# Patient Record
Sex: Female | Born: 1950 | Race: White | Hispanic: No | Marital: Married | Smoking: Former smoker
Health system: Southern US, Community
[De-identification: ages and names within clinical notes are randomized; demographics above are authoritative.]

## PROBLEM LIST (undated history)

## (undated) ENCOUNTER — Ambulatory Visit (HOSPITAL_COMMUNITY): Admission: EM | Discharge status: Absent without leave | Payer: PPO

## (undated) DIAGNOSIS — G4700 Insomnia, unspecified: Secondary | ICD-10-CM

## (undated) DIAGNOSIS — E785 Hyperlipidemia, unspecified: Secondary | ICD-10-CM

## (undated) DIAGNOSIS — Z8619 Personal history of other infectious and parasitic diseases: Secondary | ICD-10-CM

## (undated) DIAGNOSIS — R002 Palpitations: Secondary | ICD-10-CM

## (undated) DIAGNOSIS — I10 Essential (primary) hypertension: Secondary | ICD-10-CM

## (undated) DIAGNOSIS — T7840XA Allergy, unspecified, initial encounter: Secondary | ICD-10-CM

## (undated) DIAGNOSIS — M1990 Unspecified osteoarthritis, unspecified site: Secondary | ICD-10-CM

## (undated) DIAGNOSIS — K635 Polyp of colon: Secondary | ICD-10-CM

## (undated) DIAGNOSIS — H269 Unspecified cataract: Secondary | ICD-10-CM

## (undated) DIAGNOSIS — R0982 Postnasal drip: Secondary | ICD-10-CM

## (undated) HISTORY — PX: JOINT REPLACEMENT: SHX530

## (undated) HISTORY — PX: ESOPHAGOGASTRODUODENOSCOPY: SHX1529

## (undated) HISTORY — DX: Hyperlipidemia, unspecified: E78.5

## (undated) HISTORY — DX: Essential (primary) hypertension: I10

## (undated) HISTORY — DX: Unspecified osteoarthritis, unspecified site: M19.90

## (undated) HISTORY — DX: Unspecified cataract: H26.9

## (undated) HISTORY — DX: Postnasal drip: R09.82

## (undated) HISTORY — PX: KNEE SURGERY: SHX244

## (undated) HISTORY — PX: COLONOSCOPY: SHX174

## (undated) HISTORY — PX: ABDOMINAL HYSTERECTOMY: SHX81

## (undated) HISTORY — DX: Personal history of other infectious and parasitic diseases: Z86.19

## (undated) HISTORY — PX: EYE SURGERY: SHX253

## (undated) HISTORY — PX: OTHER SURGICAL HISTORY: SHX169

## (undated) HISTORY — DX: Insomnia, unspecified: G47.00

## (undated) HISTORY — DX: Allergy, unspecified, initial encounter: T78.40XA

## (undated) HISTORY — DX: Palpitations: R00.2

## (undated) HISTORY — PX: BREAST EXCISIONAL BIOPSY: SUR124

## (undated) HISTORY — DX: Polyp of colon: K63.5

---

## 2001-01-25 HISTORY — PX: KNEE SURGERY: SHX244

## 2001-02-06 ENCOUNTER — Ambulatory Visit (HOSPITAL_BASED_OUTPATIENT_CLINIC_OR_DEPARTMENT_OTHER): Admission: RE | Admit: 2001-02-06 | Discharge: 2001-02-07 | Payer: Self-pay | Admitting: Orthopaedic Surgery

## 2004-05-27 ENCOUNTER — Encounter: Payer: Self-pay | Admitting: Family Medicine

## 2004-08-01 ENCOUNTER — Ambulatory Visit: Payer: Self-pay | Admitting: Family Medicine

## 2004-10-20 ENCOUNTER — Ambulatory Visit: Payer: Self-pay | Admitting: Family Medicine

## 2004-11-25 HISTORY — PX: CARPAL TUNNEL RELEASE: SHX101

## 2004-12-14 ENCOUNTER — Ambulatory Visit: Payer: Self-pay | Admitting: Family Medicine

## 2004-12-26 ENCOUNTER — Encounter: Admission: RE | Admit: 2004-12-26 | Discharge: 2004-12-26 | Payer: Self-pay | Admitting: Family Medicine

## 2005-04-12 ENCOUNTER — Encounter: Admission: RE | Admit: 2005-04-12 | Discharge: 2005-04-12 | Payer: Self-pay | Admitting: Obstetrics and Gynecology

## 2005-06-15 ENCOUNTER — Ambulatory Visit: Payer: Self-pay | Admitting: Family Medicine

## 2005-07-17 ENCOUNTER — Ambulatory Visit: Payer: Self-pay | Admitting: Internal Medicine

## 2005-10-25 ENCOUNTER — Ambulatory Visit: Payer: Self-pay | Admitting: Family Medicine

## 2005-10-26 ENCOUNTER — Encounter: Admission: RE | Admit: 2005-10-26 | Discharge: 2005-10-26 | Payer: Self-pay | Admitting: Family Medicine

## 2005-11-14 ENCOUNTER — Ambulatory Visit: Payer: Self-pay | Admitting: Gastroenterology

## 2005-12-05 ENCOUNTER — Encounter (INDEPENDENT_AMBULATORY_CARE_PROVIDER_SITE_OTHER): Payer: Self-pay | Admitting: *Deleted

## 2005-12-05 ENCOUNTER — Ambulatory Visit: Payer: Self-pay | Admitting: Gastroenterology

## 2005-12-25 HISTORY — PX: CHOLECYSTECTOMY: SHX55

## 2006-01-17 ENCOUNTER — Encounter (INDEPENDENT_AMBULATORY_CARE_PROVIDER_SITE_OTHER): Payer: Self-pay | Admitting: Specialist

## 2006-01-17 ENCOUNTER — Ambulatory Visit (HOSPITAL_COMMUNITY): Admission: RE | Admit: 2006-01-17 | Discharge: 2006-01-17 | Payer: Self-pay | Admitting: Surgery

## 2006-02-15 ENCOUNTER — Encounter: Admission: RE | Admit: 2006-02-15 | Discharge: 2006-02-15 | Payer: Self-pay | Admitting: Surgery

## 2006-04-15 ENCOUNTER — Encounter: Admission: RE | Admit: 2006-04-15 | Discharge: 2006-04-15 | Payer: Self-pay | Admitting: Obstetrics and Gynecology

## 2006-07-12 ENCOUNTER — Ambulatory Visit: Payer: Self-pay | Admitting: Family Medicine

## 2006-07-25 ENCOUNTER — Encounter: Admission: RE | Admit: 2006-07-25 | Discharge: 2006-07-25 | Payer: Self-pay | Admitting: Family Medicine

## 2006-10-22 ENCOUNTER — Ambulatory Visit: Payer: Self-pay | Admitting: Family Medicine

## 2007-01-28 ENCOUNTER — Emergency Department (HOSPITAL_COMMUNITY): Admission: EM | Admit: 2007-01-28 | Discharge: 2007-01-28 | Payer: Self-pay | Admitting: Emergency Medicine

## 2007-05-07 ENCOUNTER — Encounter: Admission: RE | Admit: 2007-05-07 | Discharge: 2007-05-07 | Payer: Self-pay | Admitting: Obstetrics and Gynecology

## 2007-06-11 ENCOUNTER — Encounter: Payer: Self-pay | Admitting: Family Medicine

## 2007-06-11 DIAGNOSIS — E785 Hyperlipidemia, unspecified: Secondary | ICD-10-CM

## 2007-06-11 DIAGNOSIS — R002 Palpitations: Secondary | ICD-10-CM | POA: Insufficient documentation

## 2007-06-17 ENCOUNTER — Encounter: Payer: Self-pay | Admitting: Family Medicine

## 2007-06-18 ENCOUNTER — Ambulatory Visit: Payer: Self-pay | Admitting: Family Medicine

## 2007-06-18 DIAGNOSIS — G4700 Insomnia, unspecified: Secondary | ICD-10-CM

## 2007-06-19 LAB — CONVERTED CEMR LAB
ALT: 19 units/L (ref 0–35)
Cholesterol: 232 mg/dL (ref 0–200)
Direct LDL: 159.1 mg/dL
HDL: 54.3 mg/dL (ref >39.0)
Triglycerides: 129 mg/dL (ref 0–149)
VLDL: 26 mg/dL (ref 0–40)

## 2007-06-20 ENCOUNTER — Ambulatory Visit: Payer: Self-pay | Admitting: Cardiology

## 2007-09-05 ENCOUNTER — Encounter: Payer: Self-pay | Admitting: Family Medicine

## 2008-03-03 ENCOUNTER — Ambulatory Visit: Payer: Self-pay | Admitting: Family Medicine

## 2008-05-07 ENCOUNTER — Encounter: Admission: RE | Admit: 2008-05-07 | Discharge: 2008-05-07 | Payer: Self-pay | Admitting: Obstetrics and Gynecology

## 2008-08-03 ENCOUNTER — Ambulatory Visit: Payer: Self-pay | Admitting: Family Medicine

## 2008-08-03 ENCOUNTER — Telehealth (INDEPENDENT_AMBULATORY_CARE_PROVIDER_SITE_OTHER): Payer: Self-pay | Admitting: *Deleted

## 2008-08-03 ENCOUNTER — Telehealth: Payer: Self-pay | Admitting: Family Medicine

## 2008-08-03 LAB — CONVERTED CEMR LAB
Bilirubin Urine: NEGATIVE
Casts: 0 /lpf
Glucose, Urine, Semiquant: NEGATIVE
Ketones, urine, test strip: NEGATIVE
Specific Gravity, Urine: 1.01
Urobilinogen, UA: 0.2
Yeast, UA: 0

## 2008-08-04 ENCOUNTER — Encounter: Payer: Self-pay | Admitting: Family Medicine

## 2008-08-17 ENCOUNTER — Ambulatory Visit: Payer: Self-pay | Admitting: Family Medicine

## 2008-08-17 LAB — CONVERTED CEMR LAB
Bilirubin Urine: NEGATIVE
Glucose, Urine, Semiquant: NEGATIVE
Ketones, urine, test strip: NEGATIVE
Nitrite: NEGATIVE
Specific Gravity, Urine: 1.025
pH: 6

## 2008-08-18 ENCOUNTER — Encounter: Payer: Self-pay | Admitting: Family Medicine

## 2008-08-18 LAB — CONVERTED CEMR LAB
ALT: 20 units/L (ref 0–35)
Cholesterol: 216 mg/dL — ABNORMAL HIGH (ref 0–200)
LDL Cholesterol: 128 mg/dL — ABNORMAL HIGH (ref 0–99)
Triglycerides: 161 mg/dL — ABNORMAL HIGH (ref <150)

## 2009-05-09 ENCOUNTER — Encounter: Admission: RE | Admit: 2009-05-09 | Discharge: 2009-05-09 | Payer: Self-pay | Admitting: Obstetrics and Gynecology

## 2009-05-11 ENCOUNTER — Encounter (INDEPENDENT_AMBULATORY_CARE_PROVIDER_SITE_OTHER): Payer: Self-pay | Admitting: *Deleted

## 2009-07-19 ENCOUNTER — Ambulatory Visit: Payer: Self-pay | Admitting: Family Medicine

## 2009-07-19 DIAGNOSIS — L821 Other seborrheic keratosis: Secondary | ICD-10-CM | POA: Insufficient documentation

## 2009-11-07 ENCOUNTER — Ambulatory Visit: Payer: Self-pay | Admitting: Family Medicine

## 2009-11-07 DIAGNOSIS — Z862 Personal history of diseases of the blood and blood-forming organs and certain disorders involving the immune mechanism: Secondary | ICD-10-CM

## 2009-11-08 LAB — CONVERTED CEMR LAB
Basophils Absolute: 0 10*3/uL (ref 0.0–0.1)
Basophils Relative: 0.4 % (ref 0.0–3.0)
Eosinophils Absolute: 0.1 10*3/uL (ref 0.0–0.7)
HCT: 40.8 % (ref 36.0–46.0)
Hemoglobin: 13.8 g/dL (ref 12.0–15.0)
Iron: 104 ug/dL (ref 42–145)
Lymphocytes Relative: 26.1 % (ref 12.0–46.0)
MCV: 96.7 fL (ref 78.0–100.0)
Monocytes Absolute: 0.5 10*3/uL (ref 0.1–1.0)
Platelets: 141 10*3/uL — ABNORMAL LOW (ref 150.0–400.0)
Transferrin: 289.7 mg/dL (ref 212.0–360.0)

## 2009-12-19 ENCOUNTER — Ambulatory Visit: Payer: Self-pay | Admitting: Family Medicine

## 2009-12-19 ENCOUNTER — Telehealth: Payer: Self-pay | Admitting: Family Medicine

## 2009-12-19 LAB — CONVERTED CEMR LAB
Casts: 0 /lpf
Glucose, Urine, Semiquant: NEGATIVE
Ketones, urine, test strip: NEGATIVE
Protein, U semiquant: NEGATIVE
Specific Gravity, Urine: 1.01
Urine crystals, microscopic: 0 /hpf
Urobilinogen, UA: 0.2
Yeast, UA: 0
pH: 5

## 2009-12-20 ENCOUNTER — Encounter: Payer: Self-pay | Admitting: Family Medicine

## 2010-01-02 ENCOUNTER — Ambulatory Visit: Payer: Self-pay | Admitting: Family Medicine

## 2010-01-03 LAB — CONVERTED CEMR LAB
ALT: 24 units/L (ref 0–35)
AST: 25 units/L (ref 0–37)
Albumin: 4.1 g/dL (ref 3.5–5.2)
Alkaline Phosphatase: 106 units/L (ref 39–117)
BUN: 13 mg/dL (ref 6–23)
Basophils Absolute: 0 10*3/uL (ref 0.0–0.1)
Bilirubin, Direct: 0.1 mg/dL (ref 0.0–0.3)
CO2: 32 meq/L (ref 19–32)
Calcium: 10.4 mg/dL (ref 8.4–10.5)
Creatinine, Ser: 0.8 mg/dL (ref 0.4–1.2)
Eosinophils Absolute: 0.1 10*3/uL (ref 0.0–0.7)
GFR calc non Af Amer: 79.52 mL/min (ref >60)
Glucose, Bld: 110 mg/dL — ABNORMAL HIGH (ref 70–99)
Lymphocytes Relative: 46.4 % — ABNORMAL HIGH (ref 12.0–46.0)
Lymphs Abs: 2 10*3/uL (ref 0.7–4.0)
MCV: 94.3 fL (ref 78.0–100.0)
Monocytes Absolute: 0.5 10*3/uL (ref 0.1–1.0)
Monocytes Relative: 10.4 % (ref 3.0–12.0)
Sodium: 144 meq/L (ref 135–145)
Total Bilirubin: 0.5 mg/dL (ref 0.3–1.2)
Total CHOL/HDL Ratio: 5
Triglycerides: 263 mg/dL — ABNORMAL HIGH (ref 0.0–149.0)
WBC: 4.4 10*3/uL — ABNORMAL LOW (ref 4.5–10.5)

## 2010-01-06 ENCOUNTER — Ambulatory Visit: Payer: Self-pay | Admitting: Family Medicine

## 2010-01-06 DIAGNOSIS — R0982 Postnasal drip: Secondary | ICD-10-CM | POA: Insufficient documentation

## 2010-01-06 DIAGNOSIS — D126 Benign neoplasm of colon, unspecified: Secondary | ICD-10-CM | POA: Insufficient documentation

## 2010-05-10 ENCOUNTER — Encounter: Admission: RE | Admit: 2010-05-10 | Discharge: 2010-05-10 | Payer: Self-pay | Admitting: Obstetrics and Gynecology

## 2010-05-11 ENCOUNTER — Encounter: Payer: Self-pay | Admitting: Family Medicine

## 2010-09-26 NOTE — Assessment & Plan Note (Signed)
Summary: FEEL LIKE SHE HAS TO URINATE, BUT ONLY A FEW DROPS WHEN SHE G...  Nurse Visit Pt complains of feeling of urgency but when urinates only voids small amount. No frequency, no burn or pain when urinate and no back pain. symptoms started this morning. [facility] [place] if [facility] is needed [phone] and pt can be reached on cell [phone]. Please advise.    Allergies: 1)  Penicillin 2)  Keflex 3)  Amoxicillin 4)  * Nasonex 5)  Lipitor 6)  * Crestor Laboratory Results   Urine Tests  Date/Time Received: December 19, 2009 12:08 PM  Date/Time Reported: December 19, 2009 12:08 PM   Routine Urinalysis   Color: yellow Appearance: Clear Glucose: negative   (Normal Range: Negative) Bilirubin: negative   (Normal Range: Negative) Ketone: negative   (Normal Range: Negative) Spec. Gravity: 1.010   (Normal Range: 1.003-1.035) Blood: slight trace   (Normal Range: Negative) pH: 5.0   (Normal Range: 5.0-8.0) Protein: negative   (Normal Range: Negative) Urobilinogen: 0.2   (Normal Range: 0-1) Nitrite: negative   (Normal Range: Negative) Leukocyte Esterace: negative   (Normal Range: Negative)  Urine Microscopic WBC/HPF: 3-5 RBC/HPF: 1-2 Bacteria/HPF: few Mucous/HPF: few Epithelial/HPF: 1-2 Crystals/HPF: 0 Casts/LPF: 0 Yeast/HPF: 0 Other: 0    Comments: urine shows evidence of mild / early uti  push water intake please call in septra DS 1 by mouth two times a day for 5 days #10 no ref  please culture urine as well update if symptoms worsen or do not improve     Orders Added: 1)  Est. Patient Level I [87564] 2)  UA Dipstick W/ Micro (manual) [81000] 3)  T-Culture, Urine [[id number]] 4)  Specimen Handling [99000]   [facility] as instructed. Left message for patient to call back. Urine  culture has been sent.Lewanda Rife LPN  December 19, 2009 2:49 PM   Patient notified as instructed by telephone. Lewanda Rife LPN  December 19, 2009 3:33 PM

## 2010-09-26 NOTE — Letter (Signed)
Summary: Results Follow up Letter  [facility]  [address] [place], [place] [address]   Phone: [phone]  Fax: [phone]    05/11/2010 MRN: [id number]    Alisha Long [address] [place], [place]  [address]    Dear [name],  The following are the results of your recent test(s):  Test         Result    Pap Smear:        Normal _____  Not Normal _____ Comments: ______________________________________________________ Cholesterol: LDL(Bad cholesterol):         Your goal is less than:         HDL (Good cholesterol):       Your goal is more than: Comments:  ______________________________________________________ Mammogram:        Normal _X____  Not Normal _____ Comments:Repeat in one year.   ___________________________________________________________________ Hemoccult:        Normal _____  Not normal _______ Comments:    _____________________________________________________________________ Other Tests:    We routinely do not discuss normal results over the telephone.  If you desire a copy of the results, or you have any questions about this information we can discuss them at your next office visit.   Sincerely,    Idamae Schuller Maryetta Shafer,MD  MT/ri

## 2010-09-26 NOTE — Assessment & Plan Note (Signed)
Summary: CPX/CLE   Vital Signs:  Patient profile:   60 year old female Height:      65 inches Weight:      167.50 pounds BMI:     27.97 Temp:     98.2 degrees F oral Pulse rate:   64 / minute Pulse rhythm:   regular BP sitting:   124 / 76  (left arm) Cuff size:   regular  Vitals Entered By: Lewanda Rife LPN (01/13/10 10:26 AM) CC: CPX LMP partial Hyst 1988   CC:  CPX LMP partial Hyst 1988.  History of Present Illness: here for health mt exam and to rev chronic med problems   is doing better overall after recent ? uti    wt is down 3 lb-- is working on loosing wt , - is hard to go the gym    chol upa bit with trig 263/ HDL 46 and LDL 139 up from 128 not a lot of sat fats  no red meat  more fish and chicken  does cook with real butter    hyst in past partial  no gyn symptoms at all  pap 05- no problems   colon -07 -- took out polyp off -- said she need in 3 years   is taking some vit D - ? what to take  had heel dexa 3y ago and was fine    mam 9/10 self exam- no lumps or changes   Td 04  shingles status--had shingles in march , thinking about shingles vaccine this summer   gluc 110 - does like her chocolate  no thirst or other signs of DM   her somewhat chronic cough is somewhat worse  has chronic post nasal drip - it never stops  no indigestion or reflux symptoms    Allergies: 1)  Penicillin 2)  Keflex 3)  Amoxicillin 4)  * Nasonex 5)  Lipitor 6)  * Crestor  Past History:  Past Surgical History: Last updated: 06/11/2007 Caesarean section x 3 Hysterectomy- bleeding- ovaries intact Knee surgery (10/2000) Knee surgery, replaced ACL (01/2001) Carpal tunnel release (11/2004) Abd [place]- gallstones (10/2005) EGD- normal (11/2005) Colonoscopy- polyps Cholecystectomy- 12/2005)  Family History: Last updated: 13-Jan-2010 Father: deceased- epilepsy Mother: well Siblings:  no DM in family no OP in family   Social History: Last updated:  02/03/2009 Marital Status: Married Children: 3 Occupation: [employer] Tobacco Use - Former.  Alcohol Use - no Regular Exercise - yes Drug Use - no  Risk Factors: Exercise: yes (02/03/2009)  Risk Factors: Smoking Status: quit (02/03/2009)  Past Medical History:  SYMPTOM, INSOMNIA NOS (I Hx of PALPITATIONS (ICD-785.1) HYPERLIPIDEMIA (ICD-272.4) colon polyps  post nasal drip with chronic cough  hx of shingles in past  Family History: Father: deceased- epilepsy Mother: well Siblings:  no DM in family no OP in family   Review of Systems General:  Denies fatigue, fever, loss of appetite, and malaise. Eyes:  Denies blurring and discharge. ENT:  Complains of postnasal drainage; denies nasal congestion, sinus pressure, and sore throat. CV:  Denies chest pain or discomfort, lightheadness, palpitations, and shortness of breath with exertion. Resp:  Denies cough, shortness of breath, and wheezing. GI:  Denies abdominal pain, bloody stools, change in bowel habits, indigestion, and loss of appetite. GU:  Denies abnormal vaginal bleeding, discharge, dysuria, hematuria, and urinary frequency. MS:  Denies joint redness, joint swelling, muscle aches, and cramps. Derm:  Denies lesion(s), poor wound healing, and rash. Neuro:  Denies  numbness and tingling. Psych:  Denies anxiety and depression. Endo:  Denies cold intolerance, excessive thirst, excessive urination, and heat intolerance. Heme:  Denies abnormal bruising and bleeding.  Physical Exam  General:  Well-developed,well-nourished,in no acute distress; alert,appropriate and cooperative throughout examination Head:  normocephalic, atraumatic, and no abnormalities observed.   Eyes:  vision grossly intact, pupils equal, pupils round, and pupils reactive to light.  no conjunctival pallor, injection or icterus  Ears:  R ear normal and L ear normal.   Nose:  nares are boggy but clear  Mouth:  pharynx pink and moist.   small amt  of clear post nasal drip  Neck:  supple with full rom and no masses or thyromegally, no JVD or carotid bruit  Chest Wall:  No deformities, masses, or tenderness noted. Breasts:  No mass, nodules, thickening, tenderness, bulging, retraction, inflamation, nipple discharge or skin changes noted.   Lungs:  Normal respiratory effort, chest expands symmetrically. Lungs are clear to auscultation, no crackles or wheezes. Heart:  Normal rate and regular rhythm. S1 and S2 normal without gallop, murmur, click, rub or other extra sounds. Abdomen:  Bowel sounds positive,abdomen soft and non-tender without masses, organomegaly or hernias noted. no renal bruits  Msk:  No deformity or scoliosis noted of thoracic or lumbar spine.  no acute joint changes  Pulses:  R and L carotid,radial,femoral,dorsalis pedis and posterior tibial pulses are full and equal bilaterally Extremities:  No clubbing, cyanosis, edema, or deformity noted with normal full range of motion of all joints.   Neurologic:  sensation intact to light touch, gait normal, and DTRs symmetrical and normal.   Skin:  Intact without suspicious lesions or rashes some solar lentigos  Cervical Nodes:  No lymphadenopathy noted Axillary Nodes:  No palpable lymphadenopathy Inguinal Nodes:  No significant adenopathy Psych:  normal affect, talkative and pleasant    Impression & Recommendations:  Problem # 1:  HEALTH MAINTENANCE EXAM (ICD-V70.0) Assessment Comment Only reviewed health habits including diet, exercise and skin cancer prevention reviewed health maintenance list and family history  rev labs with pt in detail today  consider zostavax this summer if insurance covers it for her age group  Problem # 2:  HYPERLIPIDEMIA (ICD-272.4) Assessment: Unchanged  disc mild inc in chol and foods to avoid  rev impact of chol on cardiovasc health also rev imp of exercise   Labs Reviewed: SGOT: 25 (01/02/2010)   SGPT: 24 (01/02/2010)   HDL:46.70  (01/02/2010), 56 (08/17/2008)  LDL:128 (08/17/2008), DEL (06/18/2007)  Chol:227 (01/02/2010), 216 (08/17/2008)  Trig:263.0 (01/02/2010), 161 (08/17/2008)  Problem # 3:  POSTNASAL DRIP (ICD-784.91) Assessment: New with cough  will try  otc antihistamine if not imp -- consider gerd tx? (no GI symptoms at all but does clear her throat )   Problem # 4:  COLONIC POLYPS (ICD-211.3) Assessment: Unchanged schedule f/u colonosc at check out today no symptoms  Orders: Gastroenterology Referral (GI)  Complete Medication List: 1)  Calcium 1200 1200-1000 Mg-unit Chew (Calcium carbonate-vit d-min) .Marland Kitchen.. 1 daily by mouth 2)  E-400-clear 400 Unit Caps (Vitamin e) .Marland Kitchen.. 1 daily by mouth 3)  Flax Seeds  .... Daily 4)  Fish Oil Tabs  .... Daily 5)  Vitamin D 5000 Units  .... Take by mouth daily 6)  Elocon 0.1 % Crea (Mometasone furoate) .... Apply to affected area once daily as needed 7)  Mucinex Dm Maximum Strength 60-1200 Mg Xr12h-tab (Dextromethorphan-guaifenesin) .... Otc as directed. 8)  Vitamin D 200iu  .... Take 1 tablet  by mouth once a day  Patient Instructions: 1)  you can raise your HDL (good cholesterol) by increasing exercise and eating omega 3 fatty acid supplement like fish oil or flax seed oil over the counter 2)  you can lower LDL (bad cholesterol) by limiting saturated fats in diet like red meat, fried foods, egg yolks, fatty breakfast meats, high fat dairy products and shellfish  3)  the current recommendation for calcium intake is 1200-1500 mg daily with 1000 IU of vitamin D  4)  try to get regular exercise -- 30 min at least 5 days per week  5)  we may consider shingles vaccine next year- check with your insurance  6)  we will schedule colonoscopy at check out  7)  try zyrtec or claritin or allegra for drip and cough-- stop the mucinex  Current Allergies (reviewed today): PENICILLIN KEFLEX AMOXICILLIN * NASONEX LIPITOR * CRESTOR

## 2010-09-26 NOTE — Assessment & Plan Note (Signed)
Summary: Rash on Upper Left Shoulder   Vital Signs:  Patient profile:   60 year old female Height:      65 inches Weight:      170 pounds BMI:     28.39 Temp:     97.5 degrees F oral Pulse rate:   84 / minute Pulse rhythm:   regular BP sitting:   138 / 80  (left arm) Cuff size:   regular  Vitals Entered By: Delilah Shan CMA Duncan Dull) (November 07, 2009 11:19 AM) CC: Rash on upper left shoulder   History of Present Illness: 60 yo here with rash that started 2 days ago on left shoulder. 3 or 4 days before the rash appeared, her skin in that area was sore and tingling. Rash is painful, not itchy.  Has not spread anywhere else. Never had anything like this before. Put cortisone cream on it with no relief of symptoms.  Ibuprofen is helping with the pain.  Current Medications (verified): 1)  Calcium 1200 1200-1000 Mg-Unit  Chew (Calcium Carbonate-Vit D-Min) .Marland Kitchen.. 1 Daily By Mouth 2)  E-400-Clear 400 Unit  Caps (Vitamin E) .Marland Kitchen.. 1 Daily By Mouth 3)  Flax Seeds .... Daily 4)  Fish Oil Tabs .... Daily 5)  Vitamin D 5000 Units .... Take By Mouth Daily 6)  Elocon 0.1 % Crea (Mometasone Furoate) .... Apply To Affected Area Once Daily 7)  Valtrex 1 Gm Tabs (Valacyclovir Hcl) .Marland Kitchen.. 1 Tab By Mouth Three Times A Day X 7 Days  Allergies: 1)  Penicillin 2)  Keflex 3)  Amoxicillin 4)  * Nasonex 5)  Lipitor 6)  * Crestor  Review of Systems      See HPI General:  Denies chills and fever. Derm:  Complains of rash.  Physical Exam  General:  Well-developed,well-nourished,in no acute distress; alert,appropriate and cooperative throughout examination Skin:  confluent red plaque on left upper shoulder, scattered papules Psych:  normal affect, talkative and pleasant    Impression & Recommendations:  Problem # 1:  SHINGLES (ICD-053.9) Assessment New Treat with Valtrex 1 g by mouth three times a day x 7 days. Advised continued supportive care with NSAIDs.  Alert [place] if symptoms change or  worsen.  Complete Medication List: 1)  Calcium 1200 1200-1000 Mg-unit Chew (Calcium carbonate-vit d-min) .Marland Kitchen.. 1 daily by mouth 2)  E-400-clear 400 Unit Caps (Vitamin e) .Marland Kitchen.. 1 daily by mouth 3)  Flax Seeds  .... Daily 4)  Fish Oil Tabs  .... Daily 5)  Vitamin D 5000 Units  .... Take by mouth daily 6)  Elocon 0.1 % Crea (Mometasone furoate) .... Apply to affected area once daily 7)  Valtrex 1 Gm Tabs (Valacyclovir hcl) .Marland Kitchen.. 1 tab by mouth three times a day x 7 days  Other Orders: Venipuncture (41324) TLB-CBC Platelet - w/Differential (85025-[id number]) TLB-IBC Pnl (Iron/FE;Transferrin) (83550-[id number]) Prescriptions: VALTREX 1 GM TABS (VALACYCLOVIR HCL) 1 tab by mouth three times a day x 7 days  #21 x 0   Entered and Authorized by:   Ruthe Mannan MD   Signed by:   Ruthe Mannan MD on 11/07/2009   Method used:   Electronically to        [facility]* (retail)       [address]       [place], [place]  [address]       Ph: [phone]       Fax: [phone]   RxID:   [id number]   Current Allergies (reviewed today): PENICILLIN KEFLEX AMOXICILLIN *  NASONEX LIPITOR * CRESTOR

## 2010-09-26 NOTE — Progress Notes (Signed)
Summary: ? UTI  Phone Note Call from Patient Call back at Work Phone [phone]   Caller: Patient Call For: Judith Part MD Summary of Call: Patient thinks she may have a UTI and would like to come in to drop off urine sample.  Please advise. Initial call taken by: Linde Gillis CMA Duncan Dull),  December 19, 2009 8:45 AM  Follow-up for Phone Call        if no appts avail- drop off urine and let me know what symptoms are in the nurse visit -thanks  Follow-up by: Judith Part MD,  December 19, 2009 9:34 AM  Additional Follow-up for Phone Call Additional follow up Details #1::        there were not appts this AM so pt came by to leave urine specimen. Info in nurse visit.Lewanda Rife LPN  December 19, 2009 11:58 AM

## 2010-10-17 ENCOUNTER — Telehealth: Payer: Self-pay | Admitting: Family Medicine

## 2010-10-24 ENCOUNTER — Other Ambulatory Visit: Payer: Self-pay | Admitting: Family Medicine

## 2010-10-24 ENCOUNTER — Encounter (INDEPENDENT_AMBULATORY_CARE_PROVIDER_SITE_OTHER): Payer: Self-pay | Admitting: *Deleted

## 2010-10-24 ENCOUNTER — Other Ambulatory Visit (INDEPENDENT_AMBULATORY_CARE_PROVIDER_SITE_OTHER): Payer: BC Managed Care – PPO

## 2010-10-24 DIAGNOSIS — E785 Hyperlipidemia, unspecified: Secondary | ICD-10-CM

## 2010-10-24 DIAGNOSIS — Z862 Personal history of diseases of the blood and blood-forming organs and certain disorders involving the immune mechanism: Secondary | ICD-10-CM

## 2010-10-24 LAB — LIPID PANEL: HDL: 60.3 mg/dL (ref >39.00)

## 2010-10-24 LAB — LDL CHOLESTEROL, DIRECT: Direct LDL: 154.6 mg/dL

## 2010-10-24 LAB — HEMOGLOBIN: Hemoglobin: 14 g/dL (ref 12.0–15.0)

## 2010-10-24 NOTE — Progress Notes (Signed)
Summary: wants chol checked / wants med for headaches   Phone Note Call from Patient Call back at Work Phone [phone]   Caller: Patient Call For: Judith Part MD Summary of Call: Patient is asking if she could have labs done to have her cholesterol checked. She also says that she has been having headaches every afternoon and is asking if she could possibly get a muscle relaxer and maybe something to help with the headaches called in to [facility].  Initial call taken by: Melody Comas,  October 17, 2010 11:44 AM  Follow-up for Phone Call        we need to disc the headaches please schedule fasting labs and then follow up lipid/ast/alt / Hb for 272 and iron def anemia and then follow up  Follow-up by: Judith Part MD,  October 17, 2010 2:01 PM  Additional Follow-up for Phone Call Additional follow up Details #1::        Left message on machine for patient to return my call.  Patient returned call. Follow up appt scheduled w/ labs prior.  Additional Follow-up by: Melody Comas,  October 18, 2010 2:26 PM

## 2010-11-01 ENCOUNTER — Encounter: Payer: Self-pay | Admitting: Family Medicine

## 2010-11-01 ENCOUNTER — Ambulatory Visit (INDEPENDENT_AMBULATORY_CARE_PROVIDER_SITE_OTHER): Payer: BC Managed Care – PPO | Admitting: Family Medicine

## 2010-11-01 DIAGNOSIS — R519 Headache, unspecified: Secondary | ICD-10-CM | POA: Insufficient documentation

## 2010-11-01 DIAGNOSIS — R51 Headache: Secondary | ICD-10-CM

## 2010-11-01 DIAGNOSIS — J069 Acute upper respiratory infection, unspecified: Secondary | ICD-10-CM | POA: Insufficient documentation

## 2010-11-01 DIAGNOSIS — E785 Hyperlipidemia, unspecified: Secondary | ICD-10-CM

## 2010-11-07 NOTE — Assessment & Plan Note (Signed)
Summary: follow up for headaches and labs/alc   Vital Signs:  Patient profile:   60 year old female Height:      65 inches Weight:      169.75 pounds BMI:     28.35 Temp:     98.3 degrees F oral Pulse rate:   64 / minute Pulse rhythm:   regular BP sitting:   118 / 76  (left arm) Cuff size:   regular  Vitals Entered By: Lewanda Rife LPN (October 31, 1608 3:52 PM) CC: f/u for h/a and labs also now has productive cough with cream colored phlegm.   History of Present Illness: here for hyperlipidemia and frequent headaches and new uri symptoms   wt is up 2 lb and bp stable 110/76 bmi 28  lipids are up from pref trig 176 and HDL 60 and LDL 154 (up from 139) diet is fair -- overall doing better than she was  red meat once per week , fried foods - once a week, egg yolks 2 times per week , breakfast meats once per week , no cheese , shellfish 2 times per month    is still going to the gym    hb is 14- stable    is having headaches -- this is 2nd bout  late afternoon gets them almost every day eyelids droop and has to tilt her head back  usually L side back of the head - constant dull ache / not throbbing  not exerional  neck gets sore too  last eye exam 2 years ago  does not work  was drinking more caffiene -- Dr Reino Kent  not as much water as she used to  gets enough sleep - sleep is better now  does get sensitive to light and sound  ibuprofen does not help  tylenol works better    hot flashes worse lately   cold symptoms- congestion  no fever  took mucinex DM some prod cough    Allergies: 1)  Penicillin 2)  Keflex 3)  Amoxicillin 4)  * Nasonex 5)  Lipitor 6)  * Crestor  Past History:  Past Medical History: Last updated: 2010/01/19  SYMPTOM, INSOMNIA NOS (I Hx of PALPITATIONS (ICD-785.1) HYPERLIPIDEMIA (ICD-272.4) colon polyps  post nasal drip with chronic cough  hx of shingles in past  Past Surgical History: Last updated: 06/11/2007 Caesarean  section x 3 Hysterectomy- bleeding- ovaries intact Knee surgery (10/2000) Knee surgery, replaced ACL (01/2001) Carpal tunnel release (11/2004) Abd [place]- gallstones (10/2005) EGD- normal (11/2005) Colonoscopy- polyps Cholecystectomy- 12/2005)  Family History: Last updated: Jan 19, 2010 Father: deceased- epilepsy Mother: well Siblings:  no DM in family no OP in family   Social History: Last updated: 02/03/2009 Marital Status: Married Children: 3 Occupation: [place] Tobacco Use - Former.  Alcohol Use - no Regular Exercise - yes Drug Use - no  Risk Factors: Exercise: yes (02/03/2009)  Risk Factors: Smoking Status: quit (02/03/2009)  Review of Systems General:  Complains of fatigue; denies chills, fever, loss of appetite, and malaise. Eyes:  Denies blurring and eye irritation. ENT:  Complains of nasal congestion, postnasal drainage, and sore throat; denies earache. CV:  Denies chest pain or discomfort and palpitations. Resp:  Complains of cough and sputum productive; denies pleuritic, shortness of breath, and wheezing. GI:  Denies abdominal pain, bloody stools, change in bowel habits, and nausea. MS:  Denies joint pain, cramps, and stiffness. Derm:  Denies itching, lesion(s), poor wound healing, and rash. Neuro:  Complains of  headaches; denies difficulty with concentration, disturbances in coordination, numbness, poor balance, sensation of room spinning, tingling, visual disturbances, and weakness. Psych:  mood is ok . Endo:  Denies cold intolerance, excessive thirst, excessive urination, and heat intolerance. Heme:  Denies abnormal bruising and bleeding.  Physical Exam  General:  overweight but generally well appearing  Head:  no sinus tenderness normocephalic, atraumatic, and no abnormalities observed.   Eyes:  vision grossly intact, pupils equal, pupils round, and pupils reactive to light.  no conjunctival pallor, injection or icterus  Ears:  R ear normal and L  ear normal.   Nose:  nares are injected and congested bilaterally  Mouth:  some post nasal drip pharynx pink and moist, no erythema, and no exudates.   Neck:  supple with full rom and no masses or thyromegally, no JVD or carotid bruit  Chest Wall:  No deformities, masses, or tenderness noted. Lungs:  Normal respiratory effort, chest expands symmetrically. Lungs are clear to auscultation, no crackles or wheezes. Heart:  Normal rate and regular rhythm. S1 and S2 normal without gallop, murmur, click, rub or other extra sounds. Abdomen:  Bowel sounds positive,abdomen soft and non-tender without masses, organomegaly or hernias noted. no renal bruits  Pulses:  R and L carotid,radial,femoral,dorsalis pedis and posterior tibial pulses are full and equal bilaterally Extremities:  No clubbing, cyanosis, edema, or deformity noted with normal full range of motion of all joints.   Neurologic:  alert & oriented X3, cranial nerves II-XII intact, strength normal in all extremities, sensation intact to light touch, gait normal, DTRs symmetrical and normal, and toes down bilaterally on Babinski.   Skin:  Intact without suspicious lesions or rashes Cervical Nodes:  No lymphadenopathy noted Inguinal Nodes:  No significant adenopathy Psych:  normal affect, talkative and pleasant    Impression & Recommendations:  Problem # 1:  HYPERLIPIDEMIA (ICD-272.4) Assessment Deteriorated  reviewed labs diet is fair for sat fats- rev this in detail rev changes to make re check lab in 3 mo  consider statin if not improving   Labs Reviewed: SGOT: 21 (10/24/2010)   SGPT: 18 (10/24/2010)   HDL:60.30 (10/24/2010), 46.70 (01/02/2010)  LDL:128 (08/17/2008), DEL (06/18/2007)  Chol:231 (10/24/2010), 227 (01/02/2010)  Trig:176.0 (10/24/2010), 263.0 (01/02/2010)  Problem # 2:  HEADACHE (ICD-784.0) Assessment: New  with both muscle tension and migraine features  will get eyes checked -- in light of lid problems/ head  position trial of flexeril with caution - for tight neck muscles disc need for more water and less caff disc analgesic rebound  f/u if not imp in 2-4 wk or if worse   Orders: Prescription Created Electronically [phone])  Problem # 3:  VIRAL URI (ICD-465.9) Assessment: New mild / with congestion recommend sympt care- see pt instructions   pt advised to update me if symptoms worsen or do not improve  Her updated medication list for this problem includes:    Mucinex Dm Maximum Strength 60-1200 Mg Xr12h-tab (Dextromethorphan-guaifenesin) ..... Otc as directed.  Complete Medication List: 1)  Calcium 1200 1200-1000 Mg-unit Chew (Calcium carbonate-vit d-min) .Marland Kitchen.. 1 daily by mouth 2)  E-400-clear 400 Unit Caps (Vitamin e) .Marland Kitchen.. 1 daily by mouth 3)  Flax Seeds  .... Daily 4)  Fish Oil Tabs  .... Daily 5)  Elocon 0.1 % Crea (Mometasone furoate) .... Apply to affected area once daily as needed 6)  Mucinex Dm Maximum Strength 60-1200 Mg Xr12h-tab (Dextromethorphan-guaifenesin) .... Otc as directed. 7)  Calcium With Vitamin D 1200/400  .Marland KitchenMarland KitchenMarland Kitchen  Take 1 tablet by mouth once a day 8)  Vitamin D 1000 Unit Tabs (Cholecalciferol) .... Take 1 tablet by mouth once a day 9)  Flexeril 10 Mg Tabs (Cyclobenzaprine hcl) .... 1/2 to 1 by mouth up to three times a day as needed headache watch for sedation  Patient Instructions: 1)  you can raise your HDL (good cholesterol) by increasing exercise and eating omega 3 fatty acid supplement like fish oil or flax seed oil over the counter 2)  you can lower LDL (bad cholesterol) by limiting saturated fats in diet like red meat, fried foods, egg yolks, fatty breakfast meats, high fat dairy products and shellfish  3)  go ahead and see eye doctor -- opthymology for your headache and eyelid issue  4)  more water / avoid caffiene  5)  try flexeril - muscle relaxer for headache  6)  if not improved in 2-4 weeks let me know  7)  schedule fasting lab in 3 months for lipid/ast/alt  272  Prescriptions: FLEXERIL 10 MG TABS (CYCLOBENZAPRINE HCL) 1/2 to 1 by mouth up to three times a day as needed headache watch for sedation  #30 x 1   Entered and Authorized by:   Judith Part MD   Signed by:   Judith Part MD on 11/01/2010   Method used:   [facility]ronically to        [facility] [id number] (retail[facility]       [address]       [place], [place]  [address]       Ph: [phone] or [phone]       Fax: [phone]   RxID:   [id number]    Orders Added: 1)  Prescription Created Electronically [[id number]] 2)  Est. Patient Level IV [36644]    Current Allergies (reviewed today): PENICILLIN KEFLEX AMOXICILLIN * NASONEX LIPITOR * CRESTOR

## 2010-12-18 ENCOUNTER — Ambulatory Visit (INDEPENDENT_AMBULATORY_CARE_PROVIDER_SITE_OTHER): Payer: BC Managed Care – PPO | Admitting: Family Medicine

## 2010-12-18 ENCOUNTER — Encounter: Payer: Self-pay | Admitting: Family Medicine

## 2010-12-18 VITALS — BP 120/80 | HR 72 | Temp 97.9°F | Ht 66.0 in | Wt 172.8 lb

## 2010-12-18 DIAGNOSIS — R300 Dysuria: Secondary | ICD-10-CM

## 2010-12-18 DIAGNOSIS — N390 Urinary tract infection, site not specified: Secondary | ICD-10-CM | POA: Insufficient documentation

## 2010-12-18 LAB — POCT URINALYSIS DIPSTICK
Bilirubin, UA: NEGATIVE
Blood, UA: NEGATIVE
Glucose, UA: NEGATIVE
Spec Grav, UA: 1.005

## 2010-12-18 MED ORDER — SULFAMETHOXAZOLE-TRIMETHOPRIM 800-160 MG PO TABS: 1.0000 | ORAL_TABLET | Freq: Two times a day (BID) | ORAL | Status: AC

## 2010-12-18 NOTE — Assessment & Plan Note (Signed)
UA neg but took Azo so may not be accurate. Given symptoms, will treat with Septra DS and send urine for culture.

## 2010-12-18 NOTE — Progress Notes (Signed)
60 yo with recurrent UTIs here for ? UTI. dysuria:yes duration of symptoms: 5 days abdominal pain: no Fevers: no back pain: no Vomiting:no  other concerns: multiple abx allergies, last pos culture pan sensitive e.coli. Has been taking Azo for past 2 days which has been helping with symptoms.  Meds, vitals, and allergies reviewed.   ROS: See HPI.  Otherwise negative.    GEN: nad, alert and oriented HEENT: mucous membranes moist NECK: supple CV: rrr.  PULM: ctab, no inc wob ABD: soft, +bs, suprapubic area tender EXT: no edema SKIN: no acute rash BACK: no CVA pain

## 2010-12-20 LAB — URINE CULTURE: Organism ID, Bacteria: NO GROWTH

## 2011-01-09 NOTE — Assessment & Plan Note (Signed)
[facility] OFFICE NOTE   NAME:Alisha Long, Alisha Long                    MRN:          [id number]  DATE:06/20/2007                            DOB:          07-13-1951    PRIMARY CARE PHYSICIAN:  Dr. Roxy Manns.   REASON FOR PRESENTATION:  Evaluate patient with palpitations.   HISTORY OF PRESENT ILLNESS:  Patient is a pleasant 60 year old without  prior cardiac history.  She has noticed some palpitations.  She notices  this after she eats at night.  She will sit down to relax, perhaps in a  recliner, and she will notice that she gets a lump in her throat, and  then some strong pounding in her head that is consistent with her  heartbeat.  She does not think it is particularly rapid.  She also  notices at night that when she goes to lie down, particularly on her  left side, she feels the same strong pounding in her chest, and then  throughout her body.  She does not have that sensation when she lies  flat on her back.  She does not notice in particular that the beats are  rapid.  She does not notice any irregularity.  She has had no presyncope  or syncope.  She does not have this with activity.  There is no chest  pressure, neck, or arm discomfort.  There is no shortness of breath.  She denies any PND or orthopnea.   PAST MEDICAL HISTORY:  1. Hyperlipidemia.  2. Scarlet fever as a toddler.  3. There is no history of diabetes or hypertension.   PAST SURGICAL HISTORY:  1. ACL reconstruction.  2. Carpal tunnel surgery.  3. Cholecystectomy.  4. C-section.  5. Hysterectomy.   ALLERGIES:  1. PENICILLIN.  2. KEFLEX.  3. AMOXICILLIN.  4. NASONEX.  5. LIPITOR.  6. CRESTOR.   MEDICATIONS:  Calcium, vitamin E, and Tylenol.   SOCIAL HISTORY:  The patient is a part-time Hydrologist.  She is  married and has 3 children.  She smoked a little bit in college.  She  drinks wine occasionally.   FAMILY HISTORY:   Noncontributory for early coronary artery disease.   REVIEW OF SYSTEMS:  As stated in the HPI, and positive for reflux, cough  related to nasal discharge, rhinitis.  Negative for all other systems.   PHYSICAL EXAMINATION:  The patient is in no distress.  Blood pressure 134/74.  Heart rate 70 and regular.  Weight 158 pounds.  Body mass index 26.  HEENT:  Eyelids are unremarkable.  Pupils equal, round, and reactive to  light.  Fundi within normal limits.  Oral mucosa unremarkable.  NECK:  No jugular venous distention at 45 degrees.  Carotid upstroke  brisk and symmetric.  No bruits.  No thyromegaly.  LYMPHATICS:  No cervical, axillary, or inguinal adenopathy.  LUNGS:  Clear to auscultation bilaterally.  BACK:  No costovertebral angle tenderness.  CHEST:  Unremarkable.  HEART:  PMI not displaced or sustained.  S1 and S2 within normal limits.  No S3.  No S4.  No clicks.  No rubs.  No murmurs.  ABDOMEN:  Flat.  Positive bowel sounds.  Normal in frequency and pitch.  No bruits.  No rebound.  No guarding.  No midline pulsatile mass.  No  hepatomegaly.  No splenomegaly.  SKIN:  No rashes.  No nodules.  EXTREMITIES:  Two plus pulses.  No edema.  No cyanosis.  No clubbing.  NEUROLOGIC:  Oriented to person, place, and time.  Cranial nerves 2  through 12 grossly intact.  Motor grossly intact.  EKG:  Sinus rhythm.  Rate 70.  Axis within normal limits.  Intervals  within normal limits.  No acute ST wave changes.   ASSESSMENT AND PLAN:  1. Palpitations.  Patient is describing a strong bounding heart rate.      I do not suspect primary dysrhythmia strongly, but would need to      evaluate this with a Holter monitor.  I will also check with Dr.      Milinda Antis to see if a TSH has been done.  2. Dyslipidemia.  We did have a discussion about this.  She has been      intolerant of statins.  She thinks she could improve her diet.      Perhaps, if she does not have an improvement in 2 to 3 months, she       might tolerate Pravastatin.  I will defer to Dr. Milinda Antis.  3. Followup.  I will see the patient back as needed based on the      results of the above.     Rollene Rotunda, MD, [facility]  Electronically Signed    JH/MedQ  DD: 06/20/2007  DT: 06/21/2007  Job #: [id number]   cc:   Marne A. Milinda Antis, MD

## 2011-01-12 NOTE — Op Note (Signed)
[place]  Patient:    Alisha Long, Alisha Long                    MRN: [id number] Proc. Date: 02/06/01 Adm. Date:  [id number] Attending:  Randolm Idol                           Operative Report  PREOPERATIVE DIAGNOSIS:  ACL deficient left knee.  POSTOPERATIVE DIAGNOSIS:  ACL deficient left knee.  PROCEDURE:  ACL reconstruction left knee with bone tendon bone allograft.  SURGEON:  Claude Manges. Cleophas Dunker, M.D.  ASSISTANT:  Arnoldo Morale, P.A.-C.  ANESTHESIA:  General orotracheal anesthesia.  COMPLICATIONS:  None.  HISTORY:  This 60 year old female injured her left knee skiing this past winter.  She has had an arthroscopic evaluation consistent with a complete ACL tear as well as a tear of the medial meniscus.  She has had a previous partial medial meniscectomy.  She has developed significant symptoms with her knee with sensation of her knee giving way with recurrent pain and effusion and now is to have Allograft ACL reconstruction.  PROCEDURE:  With the patient comfortable on the operating room table and under general orotracheal anesthesia the left lower extremity was placed in a thigh holder and a thigh tourniquet.  The left was then prepped with Duraprep in a thigh holder to the ankle.  Sterile draping was performed.  The extremity was elevated and Esmarch exsanguinated with the proximal tourniquet at 350 mmHg. The total tourniquet time was 1 hour 24 minutes.  Diagnostic arthroscopy was performed using the previous medial and lateral parapatellar tendon stab wounds.  Diagnostic arthroscopy revealed minimal chondromalacia of the patella.  There had been a previous medial meniscectomy. There was a small tiny flap tear of the posterior horn of the medial meniscus and this was carefully debrided with a basket forceps and there was chondromalacia of the femoral condyle greater then the medial tibial plateau The lateral compartment was  clear.  A notch plasty was performing debriding the remnants of the ACL stump and debriding bone in the notch to allow passage of the ACL graft.  The allograft was freeze dried bone-tendon-bone.  It was prepared by the PA while I was arthroscoping the knee.  The graft size was 105 mm with 25 mm bone plugs at both ends, 11 mm wide.  The Arthrex guide system was utilized.  The tibial guide was inserted and a drill hole made through the base of the ACL stump.  I thought it was a little bit anterior and accordingly I inserted a second guidepin behind the first, and thought it was in perfect position, centered between the medial and lateral femoral condyles and about 6 or so mm anterior to the PCL attachment. An 11 mm drill hole was then made through the small incision.  The shaver was introduced to clear out the soft tissue from around the drill hole.  The femoral guide was then inserted and a beef needle placed through the femoral notch exiting along the anterolateral femur.  A small stab wound was made to allow further passage of the guidepin.  An 11 mm reamer was then placed through the tibial hole, through the joint, and into the femur.  A small hole was made to be sure that we were in bone and then we reamed to about 27 mm.  I checked the drill hole.  I had bone circumferentially.  The graft was then carefully threaded through the tibial hole, the joint into the femoral hole under arthroscopic guidance without any difficulty.  We completed seated the bone plug in the femoral notch and into the tibial hole.  A small stab wound was then made to the center of the patella tendon.  The guidepin placed easily into the joint, into the femoral hole anteriorly over the graft.  A 7 x 20 mm metallic interference screw was then introduced with a sheath and carefully inserted applying tension to both ends of the graft.  A guidepin was then placed along the bone plug in the tibia and  placed anteriorly in femoral notch which I could visualize in the joint.  A 8 x 25 mm metallic interference screw was then inserted with the knee in about 30 degrees of flexion, the posterior drawn in tension on the graft in the tibial hole.  The screw was inserted and buried.  I checked the tension on the graft with excellent tension.  There was about 2 or 3 mm of an anterior drawer sign whereas preoperatively there was at least a centimeter.  The graft completely was without impingement.  The joint was explored without evidence of lose material.  The suture guides were removed.  The arthroscopic portals were closed with clips.  Then 2-0 Vicryl was used to close the subcutaneous in the proximal mediotibial plateau incision.  Skin was closed with skin clips.  Then 0.25% Marcaine with epinephrine was injected along all the wound edges.  Tourniquet was deflated with immediately capillary refill to the toes.  Sterile bulky dressing was applied followed by a ACE bandage, a ice pack and a cold pack, and a knee immobilizer.  The patient tolerated the procedure well without complications.  PLAN:  Recovery care over night.  Percocet for pain.  CPM, crutches, office 1 week. DD:  02/06/01 TD:  02/06/01 Job: [id number]

## 2011-01-12 NOTE — Op Note (Signed)
Alisha Long, Alisha Long             ACCOUNT NO.:  [id number]   MEDICAL RECORD NO.:  [id number]          PATIENT TYPE:  AMB   LOCATION:  DAY                          FACILITY:  [facility]   PHYSICIAN:  Wilmon Arms. Corliss Skains, M.D. DATE OF BIRTH:  1951/07/20   DATE OF PROCEDURE:  01/17/2006  DATE OF DISCHARGE:                                 OPERATIVE REPORT   PREOPERATIVE DIAGNOSIS:  Chronic calculus cholecystitis.   POSTOPERATIVE DIAGNOSIS:  Right upper quadrant pain.   PROCEDURE PERFORMED:  Laparoscopic cholecystectomy with interoperative  ultrasound.   SURGEON:  Wilmon Arms. Corliss Skains, M.D.   ASSISTANT:  Anselm Pancoast. Zachery Dakins, M.D.   INDICATIONS:  The patient is a 60 year old female who presented with several  weeks of the right upper quadrant pain radiating through to her shoulder  associated with nausea and bloating.  No sign of jaundice.  She underwent an  ultrasound at [facility] dated October 26, 2005, which was read as  cholelithiasis with a contracted gallbladder suggesting chronic  cholecystitis.  The patient has also had a recent EGD and colonoscopy to  complete her workup.  The EGD was completely normal.  The colonoscopy showed  a single polyp in the descending colon which was a tubular adenoma.  The  patient presented for laparoscopic cholecystectomy.   FINDINGS:  The gallbladder fossa contained a only tiny atrophic saccular  object.  We attempted to shoot a cholangiogram through this small structure  but were unable to thread a catheter.  The small specimen was sent for  frozen section and was found to be biliary type epithelium.  We are quite  confident that we were not around the common bile duct.  However, this tiny  structure contained stones.   DESCRIPTION OF PROCEDURE:  The patient is brought to the operating room and  placed in the supine position on the operating room table.  After an  adequate level of general endotracheal anesthesia was obtained, the  patient's  abdomen was prepped with Betadine and draped in a sterile fashion.  A transverse incision was made just below umbilicus.  The fascia was opened  vertically.  The peritoneal cava was bluntly entered.  A stay suture of 0  Vicryl was placed in a pursestring fashion around the fascial opening.  The  Hasson cannula was inserted and secured with a stay suture.  Pneumoperitoneum was obtained by insufflating CO2 maintaining a maximum  pressure of 15 mmHg.  A 10 mm port was placed in the subxiphoid position.  Two 5 mm ports were placed in the right upper quadrant. The liver was gently  retracted superiorly.  In the expected region of the gallbladder fossa,  there was only a very tiny atrophic structure.  This had the proper color  for a gallbladder.  There was some adherent peritoneum.  The peritoneum and  omentum were dissected away.  We then began carefully trying to the dissect  around this structure.  It was unclear whether this represented only an  atrophic gallbladder.  We continued our dissection down towards the  duodenum.  We were able to circumferentially dissect around  the structure.  However, we felt that this likely represented the common bile duct.  This  area was not ever opened or transected.  We worked our way back up to the  tiny structure that could possibly be the gallbladder.  There was a  narrowing approximately 2 cm from the tip of it.  This was circumferentially  dissected and was felt to possibly represent the cystic duct.  We attempted  to shoot a cholangiogram through this small structure but we were unable to  thread the catheter.  This area was ligated, clipped and divided.  The small  structure was then dissected free from the gallbladder.  This was sent for  frozen section which confirmed that it was biliary type mucosal epithelium.  We then spent some time trying to determine whether the larger structure was  the common duct or an aberrantly located gallbladder.  The  laparoscopic  ultrasound probe was brought onto the field and inserted.  We were unable to  visualize any stones in this tubular structure.  We asked the radiologist  consult.  Dr. Signa Kell came to the operating room and examined the  previous images from March 2.  He was unable to clearly identify the  gallbladder on the recorded images.  At this point, the decision was made to  terminate the procedure.  We had not injured any of the common bile duct.  We felt confident that the specimen we removed was not located at the common  bile duct.  The ports were then removed under direct vision.  The  pneumoperitoneum was released.  The umbilical fascia was closed with a  pursestring suture.  4-0 Monocryl was used to close the skin.  Steri-Strips  were applied.  We then had the ultrasound tech from radiology come to repeat  the ultrasound with the patient on the table.  She was unable to clearly  identify any structure that would be the gallbladder.  We then made the  decision to extubate the patient and evaluator her postoperative symptoms.  She may require further workup with a CT scan and possible ERCP.      Wilmon Arms. Tsuei, M.D.  Electronically Signed     MKT/MEDQ  D:  01/17/2006  T:  01/17/2006  Job:  [id number]   cc:   Rachael Fee, M.D.   Marne A. Tower, M.D. [facility]  [address] [place]  [place] [address]

## 2011-02-12 ENCOUNTER — Encounter: Payer: Self-pay | Admitting: Cardiovascular Disease

## 2011-04-06 ENCOUNTER — Other Ambulatory Visit: Payer: Self-pay | Admitting: Family Medicine

## 2011-04-06 DIAGNOSIS — Z1231 Encounter for screening mammogram for malignant neoplasm of breast: Secondary | ICD-10-CM

## 2011-04-27 ENCOUNTER — Encounter: Payer: Self-pay | Admitting: Family Medicine

## 2011-04-27 ENCOUNTER — Ambulatory Visit (INDEPENDENT_AMBULATORY_CARE_PROVIDER_SITE_OTHER): Payer: BC Managed Care – PPO | Admitting: Family Medicine

## 2011-04-27 VITALS — BP 122/80 | HR 64 | Temp 98.5°F | Wt 167.0 lb

## 2011-04-27 DIAGNOSIS — R110 Nausea: Secondary | ICD-10-CM

## 2011-04-27 LAB — CBC WITH DIFFERENTIAL/PLATELET
Basophils Relative: 0.4 % (ref 0.0–3.0)
Eosinophils Absolute: 0.1 10*3/uL (ref 0.0–0.7)
HCT: 44.5 % (ref 36.0–46.0)
Lymphs Abs: 1.6 10*3/uL (ref 0.7–4.0)
MCHC: 33.2 g/dL (ref 30.0–36.0)
MCV: 95.7 fl (ref 78.0–100.0)
Monocytes Absolute: 0.5 10*3/uL (ref 0.1–1.0)
Neutro Abs: 4.3 10*3/uL (ref 1.4–7.7)
Neutrophils Relative %: 66 % (ref 43.0–77.0)
RBC: 4.65 Mil/uL (ref 3.87–5.11)

## 2011-04-27 LAB — COMPREHENSIVE METABOLIC PANEL
ALT: 29 U/L (ref 0–35)
AST: 29 U/L (ref 0–37)
CO2: 31 mEq/L (ref 19–32)
Calcium: 10.1 mg/dL (ref 8.4–10.5)
Chloride: 106 mEq/L (ref 96–112)
GFR: 82.77 mL/min (ref >60.00)
Sodium: 142 mEq/L (ref 135–145)
Total Bilirubin: 0.7 mg/dL (ref 0.3–1.2)
Total Protein: 6.5 g/dL (ref 6.0–8.3)

## 2011-04-27 MED ORDER — ONDANSETRON HCL 4 MG PO TABS: 4.0000 mg | ORAL_TABLET | Freq: Every day | ORAL | Status: DC | PRN

## 2011-04-27 MED ORDER — OMEPRAZOLE 40 MG PO CPDR: 40.0000 mg | DELAYED_RELEASE_CAPSULE | Freq: Every day | ORAL | Status: DC

## 2011-04-27 NOTE — Patient Instructions (Addendum)
This could be reflux. Nausea medicine as needed (zofran) Start omeprazole daily for reflux for next 3 weeks to see if any improvement. Blood work today. If not getting better, or any worsening, continued weight loss, or fevers, let us know. Call GI to see when colonoscopy is due. Return for physical at your convenience.

## 2011-04-27 NOTE — Progress Notes (Signed)
  Subjective:    Patient ID: Alisha Long, female    DOB: 06-Nov-1950, 60 y.o.   MRN: [id number]  HPI CC: nausea  3wk h/o nausea.  First week severe - first 2 days stayed in bed.  Then had diarrhea for 3 days afterwards.  Appetite still down.  Some days does well, other days does worse and nausea returns.  Never emesis.  Endorses early satiety.  Sometimes with reflux sxs if eats too late at night.  Took pepto bismol which didn't really help and immodium.  No abd pain since, BMs now back to normal, fevers/chills, new rashes, not more gassy or bloated.  No blood in stool or urine.  No dysuria, frequency, urgency.  Staying well hydrated.  Some heat intolerance.  No h/o thyroid problems or family history of thyroid problems.  No weight changes or night sweats.  No dysphagia.  Both pt and husband got sick on same day, he turned out to have lyme's disease.  Pt has not had any temperature or rash.  Just staying nauseated.  Can't think of any food or any change that happened 3 wks ago.  No new medicines.  Wt Readings from Last 3 Encounters:  04/27/11 167 lb (75.751 kg)  12/18/10 172 lb 12.8 oz (78.382 kg)  11/01/10 169 lb 12 oz (76.998 kg)   Colonoscopy - thinks due for one this year.  Previously with polyps.  GI Dr. Christella Hartigan. Rare EtOH.  No NSAIDs, more tylenol ES. S/p cholecystectomy, s/p EGD when had colonoscopy, normal.  Review of Systems Per HPI    Objective:   Physical Exam  Nursing note and vitals reviewed. Constitutional: She appears well-developed and well-nourished. No distress.  HENT:  Head: Normocephalic.  Mouth/Throat: Oropharynx is clear and moist. No oropharyngeal exudate.  Eyes: Conjunctivae and EOM are normal. Pupils are equal, round, and reactive to light.  Neck: Normal range of motion. Neck supple. No thyromegaly present.  Cardiovascular: Normal rate, regular rhythm and normal heart sounds.   No murmur heard. Pulmonary/Chest: Effort normal and breath sounds normal. No  respiratory distress. She has no wheezes. She has no rales.  Abdominal: Soft. Bowel sounds are normal. She exhibits no distension and no mass. There is no hepatosplenomegaly. There is no tenderness. There is no rebound and no guarding.  Musculoskeletal: She exhibits no edema.  Skin: Skin is warm and dry. No rash noted.  Psychiatric: She has a normal mood and affect.          Assessment & Plan:

## 2011-04-27 NOTE — Assessment & Plan Note (Signed)
Could be resolving viral gastro, although a bit long duration. H/o GERD, could be return of this.  Start omeprazole 40mg  daily for 3 wks. No other new meds or foods.  No lyme disease sxs. Treat nausea with zofran. Advised if not improving, or any continued weight loss to let us know for referral to GI for rpt EGD.  Notify us sooner if fevers or other changing sxs. Blood work today. Advised make appt at her convenience for CPE with PCP.

## 2011-05-15 ENCOUNTER — Ambulatory Visit: Payer: BC Managed Care – PPO

## 2011-05-18 ENCOUNTER — Ambulatory Visit
Admission: RE | Admit: 2011-05-18 | Discharge: 2011-05-18 | Disposition: A | Discharge status: Home / Self Care | Payer: BC Managed Care – PPO | Source: Ambulatory Visit | Attending: Family Medicine | Admitting: Family Medicine

## 2011-05-18 DIAGNOSIS — Z1231 Encounter for screening mammogram for malignant neoplasm of breast: Secondary | ICD-10-CM

## 2011-05-22 ENCOUNTER — Encounter: Payer: Self-pay | Admitting: *Deleted

## 2011-05-25 ENCOUNTER — Telehealth: Payer: Self-pay | Admitting: *Deleted

## 2011-05-25 DIAGNOSIS — D126 Benign neoplasm of colon, unspecified: Secondary | ICD-10-CM

## 2011-05-25 NOTE — Telephone Encounter (Signed)
Pt states it's time for her to have another colonoscopy and endoscopy.  She would like to go to [place], Dr Christella Hartigan.  She does want both done at the same time.

## 2011-05-25 NOTE — Telephone Encounter (Signed)
I will go ahead and do ref

## 2011-05-28 ENCOUNTER — Encounter: Payer: Self-pay | Admitting: Gastroenterology

## 2011-06-26 ENCOUNTER — Ambulatory Visit (INDEPENDENT_AMBULATORY_CARE_PROVIDER_SITE_OTHER): Payer: BC Managed Care – PPO | Admitting: Gastroenterology

## 2011-06-26 ENCOUNTER — Encounter: Payer: Self-pay | Admitting: Gastroenterology

## 2011-06-26 VITALS — BP 132/70 | HR 60 | Ht 65.0 in | Wt 163.4 lb

## 2011-06-26 DIAGNOSIS — R110 Nausea: Secondary | ICD-10-CM

## 2011-06-26 DIAGNOSIS — Z86010 Personal history of colonic polyps: Secondary | ICD-10-CM

## 2011-06-26 DIAGNOSIS — R1013 Epigastric pain: Secondary | ICD-10-CM

## 2011-06-26 DIAGNOSIS — K3189 Other diseases of stomach and duodenum: Secondary | ICD-10-CM

## 2011-06-26 DIAGNOSIS — R634 Abnormal weight loss: Secondary | ICD-10-CM

## 2011-06-26 MED ORDER — PEG-KCL-NACL-NASULF-NA ASC-C 100 G PO SOLR: 1.0000 | ORAL | Status: DC

## 2011-06-26 NOTE — Patient Instructions (Signed)
You will be set up for an upper endoscopy (for nausea, weight loss) You will be set up for a colonoscopy (for polyp surveillance). Try cutting back the nexium to every other day. A copy of this information will be made available to Dr. Milinda Antis.

## 2011-06-26 NOTE — Progress Notes (Signed)
Review of pertinent gastrointestinal problems: 1. adenomatous colon polyp removed On routine colonoscopy April 2007. Recall colonoscopy at 5 year interval 2. functional dyspepsia. EGD April 2007 was normal.   HPI: This is a a very pleasant 60 year old woman whom I last saw about 5 years ago.   Had small TA removed in 2007.  Recall colonoscopy was set for 2012.  She caught a "virus" about 2 months ago.  She had terrible nausea (now vomiting) for 2 days, then diarrhea that occurred 4-5 times a day (non-bloody).  The diarrhea lasted about 4 weeks, then bowels slowly returned to nearly normal but not completley normal.  Not formed BMs.    Tried omeprazole 20mg  for nausea, changed to omeprazole 40mg  (once daily).  The nausea has completely resolved. She is still on the PPI.  She says she still has reflux ("coughing, recumbent indigestion").  Family friend has crohn's disease, recently needed surgery.  She had a complete metabolic profile, pancreatic enzymes, CBC done about 6 weeks ago and these were all normal.  She lost 10 pounds, then has put some back on.     Review of systems: Pertinent positive and negative review of systems were noted in the above HPI section. Complete review of systems was performed and was otherwise normal.    Past Medical History  Diagnosis Date  . Insomnia   . Palpitations   . Hyperlipidemia   . Colon polyps   . Post-nasal drip     with chronic cough  . History of shingles     Past Surgical History  Procedure Date  . Cesarean section     x 3  . Abdominal hysterectomy     bleeding, ovaries intact  . Knee surgery     10/2000  . Knee surgery 01/2001    replaced ACL  . Carpal tunnel release 11/2004  . Cholecystectomy 12/2005  . Colonoscopy     polyps  . Gallstones     Abd [place]   . Esophagogastroduodenoscopy     nml    Current Outpatient Prescriptions  Medication Sig Dispense Refill  . Calcium Carbonate-Vit D-Min (CALCIUM 1200) 1200-1000  MG-UNIT CHEW Chew 1 tablet by mouth daily.        . cholecalciferol (VITAMIN D) 1000 UNITS tablet Take 1,000 Units by mouth daily.        . cyclobenzaprine (FLEXERIL) 10 MG tablet Take 1/2 to one tablet by mouth up to three times a day as needed for headache.  Watch for sedation.       . fish oil-omega-3 fatty acids 1000 MG capsule Take 1 g by mouth daily.        . Flaxseed, Linseed, (FLAX SEED OIL) 1000 MG CAPS Take 1 capsule by mouth daily.        Marland Kitchen omeprazole (PRILOSEC) 40 MG capsule Take 1 capsule (40 mg total) by mouth daily.  30 capsule  1  . ondansetron (ZOFRAN) 4 MG tablet Take 1 tablet (4 mg total) by mouth daily as needed for nausea.  30 tablet  0  . vitamin E (E-400-CLEAR) 400 UNIT capsule Take 400 Units by mouth daily.          Allergies as of 06/26/2011 - Review Complete 06/26/2011  Allergen Reaction Noted  . Penicillins Shortness Of Breath 06/11/2007  . Amoxicillin  06/11/2007  . Atorvastatin  06/11/2007  . Cephalexin  06/11/2007  . Mometasone furoate  06/11/2007  . Rosuvastatin  06/11/2007    Family History  Problem Relation  Age of Onset  . Seizures Father     History   Social History  . Marital Status: Married    Spouse Name: N/A    Number of Children: 3  . Years of Education: N/A   Occupational History  . 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     Social History Main Topics  . Smoking status: Former Games developer  . Smokeless tobacco: Never Used  . Alcohol Use: No  . Drug Use: No  . Sexually Active: Not on file   Other Topics Concern  . Not on file   Social History Narrative   Married, 3 children. Works for [employer], gets regular exercise.        Physical Exam: BP 132/70  Pulse 60  Ht 5\' 5"  (1.651 m)  Wt 163 lb 6.4 oz (74.118 kg)  BMI 27.19 kg/m2 Constitutional: generally well-appearing Psychiatric: alert and oriented x3 Eyes: extraocular movements intact Mouth: oral pharynx moist, no lesions Neck: supple no  lymphadenopathy Cardiovascular: heart regular rate and rhythm Lungs: clear to auscultation bilaterally Abdomen: soft, nontender, nondistended, no obvious ascites, no peritoneal signs, normal bowel sounds Extremities: no lower extremity edema bilaterally Skin: no lesions on visible extremities    Assessment and plan: 60 y.o. female with   personal history of adenomatous polyp, recent worsening of dyspepsia, weight loss  I suspect she indeed had a viral, infectious illness that was simply slow to resolve. She is due for a surveillance colonoscopy and we will schedule that at her soon his convenience. I think proceeding with EGD at the same time is reasonable given her weight loss, continued mild dyspepsia

## 2011-07-02 ENCOUNTER — Encounter: Payer: Self-pay | Admitting: Gastroenterology

## 2011-07-02 ENCOUNTER — Ambulatory Visit (AMBULATORY_SURGERY_CENTER): Payer: BC Managed Care – PPO | Admitting: Gastroenterology

## 2011-07-02 DIAGNOSIS — R110 Nausea: Secondary | ICD-10-CM

## 2011-07-02 DIAGNOSIS — D126 Benign neoplasm of colon, unspecified: Secondary | ICD-10-CM

## 2011-07-02 DIAGNOSIS — K3189 Other diseases of stomach and duodenum: Secondary | ICD-10-CM

## 2011-07-02 DIAGNOSIS — Z1211 Encounter for screening for malignant neoplasm of colon: Secondary | ICD-10-CM

## 2011-07-02 DIAGNOSIS — R1013 Epigastric pain: Secondary | ICD-10-CM

## 2011-07-02 DIAGNOSIS — R634 Abnormal weight loss: Secondary | ICD-10-CM

## 2011-07-02 DIAGNOSIS — Z86010 Personal history of colonic polyps: Secondary | ICD-10-CM

## 2011-07-02 MED ORDER — SODIUM CHLORIDE 0.9 % IV SOLN: 500.0000 mL | INTRAVENOUS | Status: DC

## 2011-07-02 NOTE — Progress Notes (Signed)
Pt had cramping as the scope was advanced.  Medication was titrated per Dr. Christella Hartigan orders.  Abdominal pressure by the tech to aid scope advancement to reach the cecum.  Once the scope was being with drawn, the pt relaxed and rested with her eyes closed and comfortably.  Maw  Egd pt had gagging.  Pt tolerated exam well. maw

## 2011-07-02 NOTE — Patient Instructions (Signed)
Green and blue discharge instructions reviewed with patient and care partner.  Impressions/recommendations:  Polyp (handout given)  Repeat colonoscopy in 5 years.  Normal EGD.  Resume medications as you had been taking them prior to your procedure.

## 2011-07-03 ENCOUNTER — Telehealth: Payer: Self-pay | Admitting: *Deleted

## 2011-07-03 NOTE — Telephone Encounter (Signed)

## 2011-07-23 ENCOUNTER — Encounter: Payer: Self-pay | Admitting: Family Medicine

## 2011-07-23 ENCOUNTER — Ambulatory Visit (INDEPENDENT_AMBULATORY_CARE_PROVIDER_SITE_OTHER): Payer: BC Managed Care – PPO | Admitting: Family Medicine

## 2011-07-23 DIAGNOSIS — H9209 Otalgia, unspecified ear: Secondary | ICD-10-CM

## 2011-07-23 DIAGNOSIS — H9202 Otalgia, left ear: Secondary | ICD-10-CM | POA: Insufficient documentation

## 2011-07-23 NOTE — Progress Notes (Signed)
  Subjective:    Patient ID: Alisha Long, female    DOB: 1951/04/10, 61 y.o.   MRN: [id number]  HPI CC: earache  6d h/o L earache, tried tylenol and peroxide which didn't help.  Then used OTC debrox which has helped some.  Now having "bubbles popping" in ear, still with some pain.  No fevers/chills, HA, hearing changes, draining.  No recent swimming.  Some sinus congestion.  Using mucinex D as well as nasal rinse.  Review of Systems Per HPI    Objective:   Physical Exam  Nursing note and vitals reviewed. Constitutional: She appears well-developed and well-nourished. No distress.  HENT:  Head: Normocephalic and atraumatic.  Right Ear: Hearing, external ear and ear canal normal.  Left Ear: Hearing, external ear and ear canal normal.  Nose: No mucosal edema or rhinorrhea. Right sinus exhibits no maxillary sinus tenderness and no frontal sinus tenderness. Left sinus exhibits no maxillary sinus tenderness and no frontal sinus tenderness.  Mouth/Throat: Uvula is midline, oropharynx is clear and moist and mucous membranes are normal. No oropharyngeal exudate, posterior oropharyngeal edema, posterior oropharyngeal erythema or tonsillar abscesses.       External canals normal Bilateral TMs congested, L>R, dulled light reflex but no erythema or pain to palpation.  Mild fluid behind both ears.  Eyes: Conjunctivae and EOM are normal. Pupils are equal, round, and reactive to light. No scleral icterus.  Neck: Normal range of motion. Neck supple.  Cardiovascular: Normal rate, regular rhythm, normal heart sounds and intact distal pulses.   No murmur heard. Pulmonary/Chest: Effort normal and breath sounds normal. No respiratory distress. She has no wheezes. She has no rales.  Lymphadenopathy:    She has no cervical adenopathy.  Skin: Skin is warm and dry. No rash noted.       Assessment & Plan:

## 2011-07-23 NOTE — Patient Instructions (Signed)
I think you have some congestion behind both eardrums as well as a bit of fluid. Continue mucinex D as well as nasal saline irrigation. If fever >101, or worsening instead of improving, or not any better by next week, let me know.

## 2011-07-23 NOTE — Assessment & Plan Note (Signed)
Anticipate mild serous otitis, no need for abx as no evident infection today. Alternatively could have been viral AOM on left. As improving, monitor for now.  Update [place] if not improving as expected.

## 2011-10-04 ENCOUNTER — Other Ambulatory Visit: Payer: Self-pay | Admitting: *Deleted

## 2011-10-04 ENCOUNTER — Telehealth: Payer: Self-pay | Admitting: Family Medicine

## 2011-10-04 MED ORDER — VALACYCLOVIR HCL 1 G PO TABS: ORAL_TABLET | ORAL | Status: DC

## 2011-10-04 NOTE — Telephone Encounter (Signed)
Please remind me/ ask pt again -what she takes it for -- some guidelines have changed in terms of dosing, thanks

## 2011-10-04 NOTE — Telephone Encounter (Signed)
Patient requesting refill on cold sore medicine to be called into [facility].  They have tried to fax twice to get refilled.  She needs it because she only has two left and has cold sores currently.  Thanks

## 2011-10-04 NOTE — Telephone Encounter (Signed)
Received refill request from pharmacy stating that patient is requesting 500mg  tablets instead of 1gram.  Please advise.

## 2011-10-04 NOTE — Telephone Encounter (Signed)
Earlier I was asking what the valtrex was for - because cold sores did not carry over to the problem list  Let her know new protocol for dosing is different - is for 2 g bid for 1 day  Will refill electronically

## 2011-10-05 NOTE — Telephone Encounter (Signed)
Med sent to [facility] 10/04/11.

## 2011-10-05 NOTE — Telephone Encounter (Signed)
Patient notified as instructed by telephone. 

## 2011-11-19 ENCOUNTER — Other Ambulatory Visit: Payer: Self-pay | Admitting: Dermatology

## 2012-04-07 ENCOUNTER — Telehealth: Payer: Self-pay | Admitting: Family Medicine

## 2012-04-07 DIAGNOSIS — Z01419 Encounter for gynecological examination (general) (routine) without abnormal findings: Secondary | ICD-10-CM | POA: Insufficient documentation

## 2012-04-07 DIAGNOSIS — E785 Hyperlipidemia, unspecified: Secondary | ICD-10-CM

## 2012-04-07 DIAGNOSIS — Z0000 Encounter for general adult medical examination without abnormal findings: Secondary | ICD-10-CM

## 2012-04-07 NOTE — Telephone Encounter (Signed)
Message copied by Judy Pimple on Mon Apr 07, 2012 10:26 PM ------      Message from: Alvina Chou      Created: Wed Apr 02, 2012 12:45 PM      Regarding: Lab orders for Tuesday, 8.13.13       Patient is scheduled for CPX labs, please order future labs, Thanks , Camelia Eng

## 2012-04-08 ENCOUNTER — Other Ambulatory Visit (INDEPENDENT_AMBULATORY_CARE_PROVIDER_SITE_OTHER): Payer: BC Managed Care – PPO

## 2012-04-08 DIAGNOSIS — E785 Hyperlipidemia, unspecified: Secondary | ICD-10-CM

## 2012-04-08 DIAGNOSIS — Z0000 Encounter for general adult medical examination without abnormal findings: Secondary | ICD-10-CM

## 2012-04-08 LAB — CBC WITH DIFFERENTIAL/PLATELET
Basophils Relative: 0.6 % (ref 0.0–3.0)
Eosinophils Absolute: 0.1 10*3/uL (ref 0.0–0.7)
Eosinophils Relative: 1.6 % (ref 0.0–5.0)
HCT: 43.6 % (ref 36.0–46.0)
Lymphs Abs: 1.8 10*3/uL (ref 0.7–4.0)
MCHC: 32.5 g/dL (ref 30.0–36.0)
MCV: 95 fl (ref 78.0–100.0)
Monocytes Absolute: 0.5 10*3/uL (ref 0.1–1.0)
Neutrophils Relative %: 58.5 % (ref 43.0–77.0)
Platelets: 150 10*3/uL (ref 150.0–400.0)
WBC: 5.7 10*3/uL (ref 4.5–10.5)

## 2012-04-08 LAB — LIPID PANEL
Cholesterol: 247 mg/dL — ABNORMAL HIGH (ref 0–200)
HDL: 56.6 mg/dL (ref >39.00)
Total CHOL/HDL Ratio: 4
Triglycerides: 232 mg/dL — ABNORMAL HIGH (ref 0.0–149.0)
VLDL: 46.4 mg/dL — ABNORMAL HIGH (ref 0.0–40.0)

## 2012-04-08 LAB — COMPREHENSIVE METABOLIC PANEL
AST: 20 U/L (ref 0–37)
Alkaline Phosphatase: 104 U/L (ref 39–117)
BUN: 13 mg/dL (ref 6–23)
Glucose, Bld: 94 mg/dL (ref 70–99)
Potassium: 4 mEq/L (ref 3.5–5.1)
Total Bilirubin: 0.6 mg/dL (ref 0.3–1.2)

## 2012-04-09 LAB — TSH: TSH: 2.54 u[IU]/mL (ref 0.35–5.50)

## 2012-04-11 ENCOUNTER — Other Ambulatory Visit: Payer: Self-pay | Admitting: Family Medicine

## 2012-04-11 DIAGNOSIS — Z1231 Encounter for screening mammogram for malignant neoplasm of breast: Secondary | ICD-10-CM

## 2012-04-15 ENCOUNTER — Other Ambulatory Visit (HOSPITAL_COMMUNITY)
Admission: RE | Admit: 2012-04-15 | Discharge: 2012-04-15 | Disposition: A | Discharge status: Home / Self Care | Payer: BC Managed Care – PPO | Source: Ambulatory Visit | Attending: Family Medicine | Admitting: Family Medicine

## 2012-04-15 ENCOUNTER — Encounter: Payer: Self-pay | Admitting: Family Medicine

## 2012-04-15 ENCOUNTER — Ambulatory Visit (INDEPENDENT_AMBULATORY_CARE_PROVIDER_SITE_OTHER): Payer: BC Managed Care – PPO | Admitting: Family Medicine

## 2012-04-15 VITALS — BP 122/64 | HR 59 | Temp 98.3°F | Ht 66.0 in | Wt 168.8 lb

## 2012-04-15 DIAGNOSIS — E785 Hyperlipidemia, unspecified: Secondary | ICD-10-CM

## 2012-04-15 DIAGNOSIS — Z0000 Encounter for general adult medical examination without abnormal findings: Secondary | ICD-10-CM

## 2012-04-15 DIAGNOSIS — Z01419 Encounter for gynecological examination (general) (routine) without abnormal findings: Secondary | ICD-10-CM | POA: Insufficient documentation

## 2012-04-15 DIAGNOSIS — Z1151 Encounter for screening for human papillomavirus (HPV): Secondary | ICD-10-CM | POA: Insufficient documentation

## 2012-04-15 NOTE — Patient Instructions (Addendum)
If you are interested in a shingles/zoster vaccine - call your insurance to check on coverage,( you should not get it within 1 month of other vaccines) , then call us for a prescription  for it to take to a pharmacy that gives the shot  Get back to the gym for regular exercise  Avoid red meat/ fried foods/ egg yolks/ fatty breakfast meats/ butter, cheese and high fat dairy/ and shellfish   Pap and gyn exam done today  For chronic cough - try claritin or allegra or zyrtec over the counter for post nasal drip

## 2012-04-15 NOTE — Progress Notes (Signed)
Subjective:    Patient ID: Alisha Long, female    DOB: 1951/01/02, 61 y.o.   MRN: [id number]  HPI Here for health maintenance exam and to review chronic medical problems    Is feeling fine  Nothing new going on   She still feels palpitations when lying on L side-no change She was ref to cardiology - but had some set backs with those appts and was dissatisfied She declines any further work up of that problem  Had hyst partial in the past  Not supracervical ? She is not sure  Dr Elana Alm Does not see him any more  Is not having any gyn symptoms except dryness - uses replens twice a week   mammo 9/12- already has one set up for September  Self exam -no lumps  No family hx of breast cancer  Zoster status-has had shingles -2-3 years ago  Has not had the vaccine    Flu shot-did not get last fall   Td was 04-has not had one since then    colonosc 11/12  bp good 122/64 Wt is up 4 lb with bmi of 27  Lipids Lab Results  Component Value Date   CHOL 247* 04/08/2012   CHOL 231* 10/24/2010   CHOL 227* 01/02/2010   Lab Results  Component Value Date   HDL 56.60 04/08/2012   HDL 14.78 10/24/2010   HDL 29.56 01/02/2010   Lab Results  Component Value Date   LDLCALC 128* 08/17/2008   Lab Results  Component Value Date   TRIG 232.0* 04/08/2012   TRIG 176.0* 10/24/2010   TRIG 263.0* 01/02/2010   Lab Results  Component Value Date   CHOLHDL 4 04/08/2012   CHOLHDL 4 10/24/2010   CHOLHDL 5 01/02/2010   Lab Results  Component Value Date   LDLDIRECT 159.9 04/08/2012   LDLDIRECT 154.6 10/24/2010   LDLDIRECT 139.3 01/02/2010    Intol to lipitor and crestor Overall is pretty similar to last check  Diet has been good but has not been to the gym   For exercise she does work outside and walk Will be back to the gym   Patient Active Problem List  Diagnosis  . COLONIC POLYPS  . HYPERLIPIDEMIA  . OTHER SEBORRHEIC KERATOSIS  . SYMPTOM, INSOMNIA NOS  . POSTNASAL DRIP  . PALPITATIONS    . IRON DEFICIENCY ANEMIA, HX OF  . HEADACHE  . Nausea  . Earache on left  . Routine general medical examination at a health care facility   Past Medical History  Diagnosis Date  . Insomnia   . Palpitations   . Hyperlipidemia   . Colon polyps   . Post-nasal drip     with chronic cough  . History of shingles    Past Surgical History  Procedure Date  . Cesarean section     x 3  . Abdominal hysterectomy     bleeding, ovaries intact  . Knee surgery     10/2000  . Knee surgery 01/2001    replaced ACL  . Carpal tunnel release 11/2004  . Cholecystectomy 12/2005  . Colonoscopy     polyps  . Gallstones     Abd [place]   . Esophagogastroduodenoscopy     nml   History  Substance Use Topics  . Smoking status: Former Games developer  . Smokeless tobacco: Never Used  . Alcohol Use: No   Family History  Problem Relation Age of Onset  . Seizures Father    Allergies  Allergen  Reactions  . Penicillins Shortness Of Breath    REACTION: hives  . Amoxicillin     REACTION: hives  . Atorvastatin     REACTION: joint pain  . Cephalexin     REACTION: hives  . Mometasone Furoate     REACTION: headache  . Rosuvastatin     REACTION: headache   Current Outpatient Prescriptions on File Prior to Visit  Medication Sig Dispense Refill  . Calcium Carbonate-Vit D-Min (CALCIUM 1200) 1200-1000 MG-UNIT CHEW Chew 1 tablet by mouth daily.        . cholecalciferol (VITAMIN D) 1000 UNITS tablet Take 1,000 Units by mouth daily.        . cyclobenzaprine (FLEXERIL) 10 MG tablet Take 1/2 to one tablet by mouth up to three times a day as needed for headache.  Watch for sedation.       . fish oil-omega-3 fatty acids 1000 MG capsule Take 1 g by mouth daily.        . Flaxseed, Linseed, (FLAX SEED OIL) 1000 MG CAPS Take 1 capsule by mouth daily.        . valACYclovir (VALTREX) 1000 MG tablet 2 pills by mouth twice daily for 1 day, as needed for cold sores  12 tablet  1  . vitamin E (E-400-CLEAR) 400 UNIT capsule  Take 400 Units by mouth daily.              Review of Systems Review of Systems  Constitutional: Negative for fever, appetite change, fatigue and unexpected weight change.  Eyes: Negative for pain and visual disturbance.  Respiratory: Negative for cough and shortness of breath.   Cardiovascular: Negative for cp or leg edema  Gastrointestinal: Negative for nausea, diarrhea and constipation.  Genitourinary: Negative for urgency and frequency.  Skin: Negative for pallor or rash   Neurological: Negative for weakness, light-headedness, numbness and headaches.  Hematological: Negative for adenopathy. Does not bruise/bleed easily.  Psychiatric/Behavioral: Negative for dysphoric mood. The patient is not nervous/anxious.         Objective:   Physical Exam  Constitutional: She appears well-developed and well-nourished. No distress.  HENT:  Head: Normocephalic and atraumatic.  Right Ear: External ear normal.  Left Ear: External ear normal.  Nose: Nose normal.  Mouth/Throat: Oropharynx is clear and moist.  Eyes: Conjunctivae and EOM are normal. Pupils are equal, round, and reactive to light. No scleral icterus.  Neck: Normal range of motion. Neck supple. No JVD present. Carotid bruit is not present. No thyromegaly present.  Cardiovascular: Normal rate, regular rhythm, normal heart sounds and intact distal pulses.  Exam reveals no gallop.   Pulmonary/Chest: Effort normal and breath sounds normal. No respiratory distress. She has no wheezes.  Abdominal: Soft. Bowel sounds are normal. She exhibits no distension, no abdominal bruit and no mass. There is no tenderness.  Genitourinary: No breast swelling, tenderness, discharge or bleeding. There is no rash, tenderness or lesion on the right labia. There is no rash, tenderness or lesion on the left labia. No tenderness or bleeding around the vagina. No vaginal discharge found.       .breast Cervix and uterus surgically absent Nl appearing mucosa    Musculoskeletal: She exhibits no edema and no tenderness.  Lymphadenopathy:    She has no cervical adenopathy.  Neurological: She is alert. She has normal reflexes. No cranial nerve deficit. She exhibits normal muscle tone. Coordination normal.  Skin: Skin is warm and dry. No rash noted. No erythema. No pallor.  Psychiatric:  She has a normal mood and affect.          Assessment & Plan:

## 2012-04-17 NOTE — Assessment & Plan Note (Signed)
Annual pap and exam  hyst in past  No problems

## 2012-04-17 NOTE — Assessment & Plan Note (Signed)
Disc goals for lipids and reasons to control them Rev labs with pt Rev low sat fat diet in detail  Pt intol of statin medicines

## 2012-04-17 NOTE — Assessment & Plan Note (Signed)
Reviewed health habits including diet and exercise and skin cancer prevention Also reviewed health mt list, fam hx and immunizations  Commended good habits  Rev wellness labs in detail

## 2012-04-18 ENCOUNTER — Telehealth: Payer: Self-pay | Admitting: Family Medicine

## 2012-04-18 NOTE — Telephone Encounter (Signed)
Spoke to patient, she stated that the naproxen makes her heart race. She stated that she would rather have the tylenol or the ibuprofen.

## 2012-04-18 NOTE — Telephone Encounter (Signed)
If she prefers the ibuprofen she can take that otc -- it may work better than tylenol  (ibuprofen 800 mg with food up to tid for headache)--- that is 4 of the 200 mg pills Let me know how it goes

## 2012-04-18 NOTE — Telephone Encounter (Signed)
Please let pt know I have been going through her old record trying to find the headache med I gave her in the past ..and it looks like it may have been naproxen 500 mg - which is an anti inflammatory  Ask her if this is what she thinks she took and if so if she wants me to send a px in to her pharmacy, thanks

## 2012-04-21 NOTE — Telephone Encounter (Signed)
Patient notified as instructed below.

## 2012-04-29 ENCOUNTER — Telehealth: Payer: Self-pay

## 2012-04-29 NOTE — Telephone Encounter (Signed)
Pt contacted insurance co and pt can get shingles vaccine at doctors office. Pt request order for shingles vaccine and then call back to schedule nurse visit.

## 2012-04-29 NOTE — Telephone Encounter (Signed)
Patient scheduled for nurse visit on Thursday.

## 2012-04-29 NOTE — Telephone Encounter (Signed)
Go ahead and just schedule her for a nurse visit here for a shingles vaccine

## 2012-05-01 ENCOUNTER — Ambulatory Visit: Payer: BC Managed Care – PPO

## 2012-05-06 ENCOUNTER — Ambulatory Visit (INDEPENDENT_AMBULATORY_CARE_PROVIDER_SITE_OTHER): Payer: BC Managed Care – PPO | Admitting: *Deleted

## 2012-05-06 DIAGNOSIS — Z23 Encounter for immunization: Secondary | ICD-10-CM

## 2012-05-06 DIAGNOSIS — Z2911 Encounter for prophylactic immunotherapy for respiratory syncytial virus (RSV): Secondary | ICD-10-CM

## 2012-05-22 ENCOUNTER — Ambulatory Visit
Admission: RE | Admit: 2012-05-22 | Discharge: 2012-05-22 | Disposition: A | Discharge status: Home / Self Care | Payer: BC Managed Care – PPO | Source: Ambulatory Visit | Attending: Family Medicine | Admitting: Family Medicine

## 2012-05-22 DIAGNOSIS — Z1231 Encounter for screening mammogram for malignant neoplasm of breast: Secondary | ICD-10-CM

## 2012-05-23 ENCOUNTER — Encounter: Payer: Self-pay | Admitting: *Deleted

## 2012-07-02 ENCOUNTER — Other Ambulatory Visit: Payer: Self-pay | Admitting: Family Medicine

## 2012-10-22 ENCOUNTER — Other Ambulatory Visit: Payer: Self-pay | Admitting: Family Medicine

## 2012-10-27 ENCOUNTER — Ambulatory Visit (INDEPENDENT_AMBULATORY_CARE_PROVIDER_SITE_OTHER): Payer: BC Managed Care – PPO | Admitting: Family Medicine

## 2012-10-27 ENCOUNTER — Encounter: Payer: Self-pay | Admitting: Family Medicine

## 2012-10-27 VITALS — BP 142/82 | HR 72 | Temp 98.7°F | Ht 66.0 in | Wt 170.8 lb

## 2012-10-27 DIAGNOSIS — N9089 Other specified noninflammatory disorders of vulva and perineum: Secondary | ICD-10-CM | POA: Insufficient documentation

## 2012-10-27 DIAGNOSIS — K644 Residual hemorrhoidal skin tags: Secondary | ICD-10-CM

## 2012-10-27 NOTE — Progress Notes (Signed)
Subjective:    Patient ID: Alisha Long, female    DOB: 1950-11-17, 62 y.o.   MRN: [id number]  HPI Here for 2 little growths in genital area - one over cleft  Not itchy or hurting  Have been there quite a while overall  She cannot see them well - ? Skin tags   Patient Active Problem List  Diagnosis  . COLONIC POLYPS  . HYPERLIPIDEMIA  . OTHER SEBORRHEIC KERATOSIS  . SYMPTOM, INSOMNIA NOS  . POSTNASAL DRIP  . Palpitations  . IRON DEFICIENCY ANEMIA, HX OF  . HEADACHE  . Earache on left  . Routine general medical examination at a health care facility  . Routine gynecological examination   Past Medical History  Diagnosis Date  . Insomnia   . Palpitations   . Hyperlipidemia   . Colon polyps   . Post-nasal drip     with chronic cough  . History of shingles    Past Surgical History  Procedure Laterality Date  . Cesarean section      x 3  . Abdominal hysterectomy      bleeding, ovaries intact  . Knee surgery      10/2000  . Knee surgery  01/2001    replaced ACL  . Carpal tunnel release  11/2004  . Cholecystectomy  12/2005  . Colonoscopy      polyps  . Gallstones      Abd [place]   . Esophagogastroduodenoscopy      nml   History  Substance Use Topics  . Smoking status: Former Games developer  . Smokeless tobacco: Never Used  . Alcohol Use: No   Family History  Problem Relation Age of Onset  . Seizures Father    Allergies  Allergen Reactions  . Penicillins Shortness Of Breath    REACTION: hives  . Amoxicillin     REACTION: hives  . Atorvastatin     REACTION: joint pain  . Cephalexin     REACTION: hives  . Mometasone Furoate     REACTION: headache  . Rosuvastatin     REACTION: headache   Current Outpatient Prescriptions on File Prior to Visit  Medication Sig Dispense Refill  . Calcium Carbonate-Vit D-Min (CALCIUM 1200) 1200-1000 MG-UNIT CHEW Chew 1 tablet by mouth daily.        . cholecalciferol (VITAMIN D) 1000 UNITS tablet Take 1,000 Units by mouth daily.         . cyclobenzaprine (FLEXERIL) 10 MG tablet Take 1/2 to one tablet by mouth up to three times a day as needed for headache.  Watch for sedation.       . fish oil-omega-3 fatty acids 1000 MG capsule Take 1 g by mouth daily.        . Flaxseed, Linseed, (FLAX SEED OIL) 1000 MG CAPS Take 1 capsule by mouth daily.        . valACYclovir (VALTREX) 1000 MG tablet TAKE 2 TABLETS BY MOUTH 2 TIMES DAILY FOR ONE DAY *AS NEEDED FOR COLDSORES*  12 tablet  0  . vitamin E (E-400-CLEAR) 400 UNIT capsule Take 400 Units by mouth daily.         No current facility-administered medications on file prior to visit.      Review of Systems Review of Systems  Constitutional: Negative for fever, appetite change, fatigue and unexpected weight change.  Eyes: Negative for pain and visual disturbance.  Respiratory: Negative for cough and shortness of breath.   Cardiovascular: Negative for cp or palpitations  Gastrointestinal: Negative for nausea, diarrhea and constipation.  Genitourinary: Negative for urgency and frequency.  Skin: Negative for pallor or rash  pos for skin lesions  Neurological: Negative for weakness, light-headedness, numbness and headaches.  Hematological: Negative for adenopathy. Does not bruise/bleed easily.  Psychiatric/Behavioral: Negative for dysphoric mood. The patient is not nervous/anxious.         Objective:   Physical Exam  Constitutional: She appears well-developed and well-nourished. No distress.  HENT:  Head: Normocephalic and atraumatic.  Eyes: Conjunctivae and EOM are normal. Pupils are equal, round, and reactive to light.  Cardiovascular: Normal rate and regular rhythm.   Pulmonary/Chest: Effort normal and breath sounds normal.  Genitourinary:  See skin exam No genital warts seen  Musculoskeletal: She exhibits no edema.  Skin: Skin is warm and dry. No rash noted. No erythema. No pallor.  Skin tag 2-3 mm just lateral to L labia majora- is skin colored without signs of  infection   Skin tag 1-2 mm just inside buttock cleft lateral to anus  Tan in color without signs or symptoms of infection  Psychiatric: She has a normal mood and affect.          Assessment & Plan:

## 2012-10-27 NOTE — Patient Instructions (Addendum)
We treated 2 skin tags with cryotherapy- liquid nitrogen They will be sore for a while- keep clean with soap and water - and antibiotic ointment over the counter is fine also  If any drainage / redness / increased pain - keep me updated

## 2012-10-27 NOTE — Assessment & Plan Note (Signed)
Small- L labia - tx with liquid nitrogen cryotherapy - pt tolerated well Disc aftercare- see AVS Will update if any problems

## 2012-10-27 NOTE — Assessment & Plan Note (Signed)
Very small but bothersome to pt - tx with liquid nitrogen cryotx and aftercare discussed (see AVS)

## 2013-03-24 ENCOUNTER — Other Ambulatory Visit: Payer: Self-pay

## 2013-03-24 MED ORDER — CYCLOBENZAPRINE HCL 10 MG PO TABS: ORAL_TABLET | ORAL | Status: DC

## 2013-03-24 MED ORDER — MOMETASONE FUROATE 0.1 % EX CREA: 1.0000 "application " | TOPICAL_CREAM | Freq: Every day | CUTANEOUS | Status: DC | PRN

## 2013-03-24 NOTE — Telephone Encounter (Signed)
Please refill flexeril times 3 and cream times one thanks

## 2013-03-24 NOTE — Telephone Encounter (Signed)
Pt request refill cyclobenzaprine for neck pain and mometasone for poison ivy to [facility].Please advise.

## 2013-04-17 ENCOUNTER — Ambulatory Visit (INDEPENDENT_AMBULATORY_CARE_PROVIDER_SITE_OTHER): Payer: 59 | Admitting: Family Medicine

## 2013-04-17 ENCOUNTER — Encounter: Payer: Self-pay | Admitting: Family Medicine

## 2013-04-17 VITALS — BP 126/74 | HR 62 | Temp 98.7°F | Ht 66.0 in | Wt 161.0 lb

## 2013-04-17 DIAGNOSIS — L918 Other hypertrophic disorders of the skin: Secondary | ICD-10-CM | POA: Insufficient documentation

## 2013-04-17 DIAGNOSIS — L909 Atrophic disorder of skin, unspecified: Secondary | ICD-10-CM

## 2013-04-17 DIAGNOSIS — L570 Actinic keratosis: Secondary | ICD-10-CM | POA: Insufficient documentation

## 2013-04-17 DIAGNOSIS — L089 Local infection of the skin and subcutaneous tissue, unspecified: Secondary | ICD-10-CM

## 2013-04-17 NOTE — Progress Notes (Signed)
Subjective:    Patient ID: Alisha Long, female    DOB: [birth year], 62 y.o.   MRN: [id number]  HPI Here with some raised skin spots   R side of chest  Under L arm  R side of upper lip  Seems they are inflammed skin tags  Patient Active Problem List   Diagnosis Date Noted  . Anal skin tag 10/27/2012  . Skin tag of labia 10/27/2012  . Routine gynecological examination 04/15/2012  . Routine general medical examination at a health care facility 04/07/2012  . Earache on left 07/23/2011  . HEADACHE 11/01/2010  . COLONIC POLYPS 01/06/2010  . POSTNASAL DRIP 01/06/2010  . IRON DEFICIENCY ANEMIA, HX OF 11/07/2009  . OTHER SEBORRHEIC KERATOSIS 07/19/2009  . SYMPTOM, INSOMNIA NOS 06/18/2007  . HYPERLIPIDEMIA 06/11/2007  . Palpitations 06/11/2007   Past Medical History  Diagnosis Date  . Insomnia   . Palpitations   . Hyperlipidemia   . Colon polyps   . Post-nasal drip     with chronic cough  . History of shingles    Past Surgical History  Procedure Laterality Date  . Cesarean section      x 3  . Abdominal hysterectomy      bleeding, ovaries intact  . Knee surgery      10/2000  . Knee surgery  01/2001    replaced ACL  . Carpal tunnel release  11/2004  . Cholecystectomy  12/2005  . Colonoscopy      polyps  . Gallstones      Abd [place]   . Esophagogastroduodenoscopy      nml   History  Substance Use Topics  . Smoking status: Former Games developer  . Smokeless tobacco: Never Used  . Alcohol Use: No   Family History  Problem Relation Age of Onset  . Seizures Father    Allergies  Allergen Reactions  . Penicillins Shortness Of Breath    REACTION: hives  . Amoxicillin     REACTION: hives  . Atorvastatin     REACTION: joint pain  . Cephalexin     REACTION: hives  . Mometasone Furoate     REACTION: headache  . Rosuvastatin     REACTION: headache   Current Outpatient Prescriptions on File Prior to Visit  Medication Sig Dispense Refill  . Calcium Carbonate-Vit  D-Min (CALCIUM 1200) 1200-1000 MG-UNIT CHEW Chew 1 tablet by mouth daily.        . cholecalciferol (VITAMIN D) 1000 UNITS tablet Take 1,000 Units by mouth daily.        . cyclobenzaprine (FLEXERIL) 10 MG tablet Take 1/2 to one tablet by mouth up to three times a day as needed for headache.  Watch for sedation.  30 tablet  2  . fish oil-omega-3 fatty acids 1000 MG capsule Take 1 g by mouth daily.        . mometasone (ELOCON) 0.1 % cream Apply 1 application topically daily as needed.  50 g  0  . valACYclovir (VALTREX) 1000 MG tablet TAKE 2 TABLETS BY MOUTH 2 TIMES DAILY FOR ONE DAY *AS NEEDED FOR COLDSORES*  12 tablet  0  . vitamin E (E-400-CLEAR) 400 UNIT capsule Take 400 Units by mouth daily.         No current facility-administered medications on file prior to visit.      Review of Systems Review of Systems  Constitutional: Negative for fever, appetite change, fatigue and unexpected weight change.  Eyes: Negative for pain and visual disturbance.  Respiratory: Negative for cough and shortness of breath.   Cardiovascular: Negative for cp or palpitations    Gastrointestinal: Negative for nausea, diarrhea and constipation.  Genitourinary: Negative for urgency and frequency.  Skin: Negative for pallor or rash  pos for skin tags  Neurological: Negative for weakness, light-headedness, numbness and headaches.  Hematological: Negative for adenopathy. Does not bruise/bleed easily.  Psychiatric/Behavioral: Negative for dysphoric mood. The patient is not nervous/anxious.         Objective:   Physical Exam  Constitutional: She appears well-developed and well-nourished. No distress.  HENT:  Head: Normocephalic and atraumatic.  Eyes: Conjunctivae and EOM are normal. Pupils are equal, round, and reactive to light.  Skin: Skin is warm and dry. No rash noted. No erythema. No pallor.  Some solar damage and lentigos   2-3 mm skin tag/ pink on R side of chest that is irritated and abraded  2 mm  pedunculated skin tag under L arm - tan and irritated  1-2 mm area of scale above R upper lip -consistent with AK  All tx with cryotherapy  Psychiatric: She has a normal mood and affect.          Assessment & Plan:

## 2013-04-17 NOTE — Assessment & Plan Note (Signed)
Small- just above upper lip on R tx with cryotx -liquid nitrogen Pt tolerated well  Disc aftercare-see AVS

## 2013-04-17 NOTE — Patient Instructions (Addendum)
Keep areas that we treated clean and dry  Use an antibiotic ointment if you want to  If any problems, let me know  Keep using sunscreen

## 2013-04-17 NOTE — Assessment & Plan Note (Signed)
R chest and under L arm tx with cryotx -liquid nitrogen Pt tolerated well Disc after care -see AVS

## 2013-05-04 ENCOUNTER — Other Ambulatory Visit: Payer: Self-pay

## 2013-05-04 DIAGNOSIS — Z1231 Encounter for screening mammogram for malignant neoplasm of breast: Secondary | ICD-10-CM

## 2013-06-04 ENCOUNTER — Ambulatory Visit
Admission: RE | Admit: 2013-06-04 | Discharge: 2013-06-04 | Disposition: A | Discharge status: Home / Self Care | Payer: 59 | Source: Ambulatory Visit

## 2013-06-04 DIAGNOSIS — Z1231 Encounter for screening mammogram for malignant neoplasm of breast: Secondary | ICD-10-CM

## 2013-06-08 ENCOUNTER — Encounter: Payer: Self-pay | Admitting: *Deleted

## 2013-09-03 ENCOUNTER — Telehealth: Payer: Self-pay | Admitting: Family Medicine

## 2013-09-03 DIAGNOSIS — E785 Hyperlipidemia, unspecified: Secondary | ICD-10-CM

## 2013-09-03 DIAGNOSIS — Z0000 Encounter for general adult medical examination without abnormal findings: Secondary | ICD-10-CM

## 2013-09-03 NOTE — Telephone Encounter (Signed)
Message copied by Abner Greenspan on Thu Sep 03, 2013  7:48 PM ------      Message from: Ellamae Sia      Created: Wed Aug 26, 2013 10:40 AM      Regarding: Lab orders for Fruday, 1.9.15       Patient is scheduled for CPX labs, please order future labs, Thanks , Alisha Long       ------

## 2013-09-04 ENCOUNTER — Other Ambulatory Visit (INDEPENDENT_AMBULATORY_CARE_PROVIDER_SITE_OTHER): Payer: 59

## 2013-09-04 DIAGNOSIS — E785 Hyperlipidemia, unspecified: Secondary | ICD-10-CM

## 2013-09-04 DIAGNOSIS — Z0000 Encounter for general adult medical examination without abnormal findings: Secondary | ICD-10-CM

## 2013-09-04 LAB — CBC WITH DIFFERENTIAL/PLATELET
BASOS ABS: 0 10*3/uL (ref 0.0–0.1)
Basophils Relative: 0.6 % (ref 0.0–3.0)
EOS ABS: 0.2 10*3/uL (ref 0.0–0.7)
Eosinophils Relative: 2.2 % (ref 0.0–5.0)
HEMATOCRIT: 43.5 % (ref 36.0–46.0)
HEMOGLOBIN: 14.7 g/dL (ref 12.0–15.0)
LYMPHS ABS: 2.7 10*3/uL (ref 0.7–4.0)
Lymphocytes Relative: 37.7 % (ref 12.0–46.0)
MCHC: 33.8 g/dL (ref 30.0–36.0)
MCV: 94.1 fl (ref 78.0–100.0)
MONO ABS: 0.6 10*3/uL (ref 0.1–1.0)
Monocytes Relative: 8.8 % (ref 3.0–12.0)
NEUTROS ABS: 3.6 10*3/uL (ref 1.4–7.7)
Neutrophils Relative %: 50.7 % (ref 43.0–77.0)
PLATELETS: 155 10*3/uL (ref 150.0–400.0)
RBC: 4.62 Mil/uL (ref 3.87–5.11)
RDW: 13 % (ref 11.5–14.6)
WBC: 7.2 10*3/uL (ref 4.5–10.5)

## 2013-09-04 LAB — TSH: TSH: 4.93 u[IU]/mL (ref 0.35–5.50)

## 2013-09-04 LAB — COMPREHENSIVE METABOLIC PANEL
ALK PHOS: 104 U/L (ref 39–117)
ALT: 19 U/L (ref 0–35)
AST: 20 U/L (ref 0–37)
Albumin: 4 g/dL (ref 3.5–5.2)
BILIRUBIN TOTAL: 0.5 mg/dL (ref 0.3–1.2)
BUN: 16 mg/dL (ref 6–23)
CO2: 29 mEq/L (ref 19–32)
CREATININE: 0.9 mg/dL (ref 0.4–1.2)
Calcium: 10.2 mg/dL (ref 8.4–10.5)
Chloride: 106 mEq/L (ref 96–112)
GFR: 72.17 mL/min (ref >60.00)
Glucose, Bld: 93 mg/dL (ref 70–99)
Potassium: 4.2 mEq/L (ref 3.5–5.1)
SODIUM: 141 meq/L (ref 135–145)
Total Protein: 6.7 g/dL (ref 6.0–8.3)

## 2013-09-04 LAB — LIPID PANEL
CHOL/HDL RATIO: 5
Cholesterol: 257 mg/dL — ABNORMAL HIGH (ref 0–200)
HDL: 51.9 mg/dL (ref >39.00)
Triglycerides: 191 mg/dL — ABNORMAL HIGH (ref 0.0–149.0)
VLDL: 38.2 mg/dL (ref 0.0–40.0)

## 2013-09-04 LAB — LDL CHOLESTEROL, DIRECT: LDL DIRECT: 166.4 mg/dL

## 2013-09-09 ENCOUNTER — Encounter: Payer: Self-pay | Admitting: Family Medicine

## 2013-09-09 ENCOUNTER — Ambulatory Visit (INDEPENDENT_AMBULATORY_CARE_PROVIDER_SITE_OTHER): Payer: 59 | Admitting: Family Medicine

## 2013-09-09 VITALS — BP 126/80 | HR 61 | Temp 98.5°F | Ht 64.57 in | Wt 166.5 lb

## 2013-09-09 DIAGNOSIS — Z23 Encounter for immunization: Secondary | ICD-10-CM

## 2013-09-09 DIAGNOSIS — Z0000 Encounter for general adult medical examination without abnormal findings: Secondary | ICD-10-CM

## 2013-09-09 DIAGNOSIS — E785 Hyperlipidemia, unspecified: Secondary | ICD-10-CM

## 2013-09-09 MED ORDER — BENZONATATE 200 MG PO CAPS: 200.0000 mg | ORAL_CAPSULE | Freq: Three times a day (TID) | ORAL | Status: DC | PRN

## 2013-09-09 MED ORDER — VALACYCLOVIR HCL 1 G PO TABS: ORAL_TABLET | ORAL | Status: DC

## 2013-09-09 NOTE — Patient Instructions (Signed)
Td vaccine today (tetanus) - you are not getting the Tdap vaccine  Watch diet for cholesterol (Avoid red meat/ fried foods/ egg yolks/ fatty breakfast meats/ butter, cheese and high fat dairy/ and shellfish) Get back to exercise      Fat and Cholesterol Control Diet Fat and cholesterol levels in your blood and organs are influenced by your diet. High levels of fat and cholesterol may lead to diseases of the heart, small and large blood vessels, gallbladder, liver, and pancreas. CONTROLLING FAT AND CHOLESTEROL WITH DIET Although exercise and lifestyle factors are important, your diet is key. That is because certain foods are known to raise cholesterol and others to lower it. The goal is to balance foods for their effect on cholesterol and more importantly, to replace saturated and trans fat with other types of fat, such as monounsaturated fat, polyunsaturated fat, and omega-3 fatty acids. On average, a person should consume no more than 15 to 17 g of saturated fat daily. Saturated and trans fats are considered "bad" fats, and they will raise LDL cholesterol. Saturated fats are primarily found in animal products such as meats, butter, and cream. However, that does not mean you need to give up all your favorite foods. Today, there are good tasting, low-fat, low-cholesterol substitutes for most of the things you like to eat. Choose low-fat or nonfat alternatives. Choose round or loin cuts of red meat. These types of cuts are lowest in fat and cholesterol. Chicken (without the skin), fish, veal, and ground [place] breast are great choices. Eliminate fatty meats, such as hot dogs and salami. Even shellfish have little or no saturated fat. Have a 3 oz (85 g) portion when you eat lean meat, poultry, or fish. Trans fats are also called "partially hydrogenated oils." They are oils that have been scientifically manipulated so that they are solid at room temperature resulting in a longer shelf life and improved taste  and texture of foods in which they are added. Trans fats are found in stick margarine, some tub margarines, cookies, crackers, and baked goods.  When baking and cooking, oils are a great substitute for butter. The monounsaturated oils are especially beneficial since it is believed they lower LDL and raise HDL. The oils you should avoid entirely are saturated tropical oils, such as coconut and palm.  Remember to eat a lot from food groups that are naturally free of saturated and trans fat, including fish, fruit, vegetables, beans, grains (barley, rice, couscous, bulgur wheat), and pasta (without cream sauces).  IDENTIFYING FOODS THAT LOWER FAT AND CHOLESTEROL  Soluble fiber may lower your cholesterol. This type of fiber is found in fruits such as apples, vegetables such as broccoli, potatoes, and carrots, legumes such as beans, peas, and lentils, and grains such as barley. Foods fortified with plant sterols (phytosterol) may also lower cholesterol. You should eat at least 2 g per day of these foods for a cholesterol lowering effect.  Read package labels to identify low-saturated fats, trans fat free, and low-fat foods at the supermarket. Select cheeses that have only 2 to 3 g saturated fat per ounce. Use a heart-healthy tub margarine that is free of trans fats or partially hydrogenated oil. When buying baked goods (cookies, crackers), avoid partially hydrogenated oils. Breads and muffins should be made from whole grains (whole-wheat or whole oat flour, instead of "flour" or "enriched flour"). Buy non-creamy canned soups with reduced salt and no added fats.  FOOD PREPARATION TECHNIQUES  Never deep-fry. If you must fry,  either stir-fry, which uses very little fat, or use non-stick cooking sprays. When possible, broil, bake, or roast meats, and steam vegetables. Instead of putting butter or margarine on vegetables, use lemon and herbs, applesauce, and cinnamon (for squash and sweet potatoes). Use nonfat yogurt,  salsa, and low-fat dressings for salads.  LOW-SATURATED FAT / LOW-FAT FOOD SUBSTITUTES Meats / Saturated Fat (g)  Avoid: Steak, marbled (3 oz/85 g) / 11 g  Choose: Steak, lean (3 oz/85 g) / 4 g  Avoid: Hamburger (3 oz/85 g) / 7 g  Choose: Hamburger, lean (3 oz/85 g) / 5 g  Avoid: Ham (3 oz/85 g) / 6 g  Choose: Ham, lean cut (3 oz/85 g) / 2.4 g  Avoid: Chicken, with skin, dark meat (3 oz/85 g) / 4 g  Choose: Chicken, skin removed, dark meat (3 oz/85 g) / 2 g  Avoid: Chicken, with skin, light meat (3 oz/85 g) / 2.5 g  Choose: Chicken, skin removed, light meat (3 oz/85 g) / 1 g Dairy / Saturated Fat (g)  Avoid: Whole milk (1 cup) / 5 g  Choose: Low-fat milk, 2% (1 cup) / 3 g  Choose: Low-fat milk, 1% (1 cup) / 1.5 g  Choose: Skim milk (1 cup) / 0.3 g  Avoid: Hard cheese (1 oz/28 g) / 6 g  Choose: Skim milk cheese (1 oz/28 g) / 2 to 3 g  Avoid: Cottage cheese, 4% fat (1 cup) / 6.5 g  Choose: Low-fat cottage cheese, 1% fat (1 cup) / 1.5 g  Avoid: Ice cream (1 cup) / 9 g  Choose: Sherbet (1 cup) / 2.5 g  Choose: Nonfat frozen yogurt (1 cup) / 0.3 g  Choose: Frozen fruit bar / trace  Avoid: Whipped cream (1 tbs) / 3.5 g  Choose: Nondairy whipped topping (1 tbs) / 1 g Condiments / Saturated Fat (g)  Avoid: Mayonnaise (1 tbs) / 2 g  Choose: Low-fat mayonnaise (1 tbs) / 1 g  Avoid: Butter (1 tbs) / 7 g  Choose: Extra light margarine (1 tbs) / 1 g  Avoid: Coconut oil (1 tbs) / 11.8 g  Choose: Olive oil (1 tbs) / 1.8 g  Choose: Corn oil (1 tbs) / 1.7 g  Choose: Safflower oil (1 tbs) / 1.2 g  Choose: Sunflower oil (1 tbs) / 1.4 g  Choose: Soybean oil (1 tbs) / 2.4 g  Choose: Canola oil (1 tbs) / 1 g Document Released: 08/13/2005 Document Revised: 12/08/2012 Document Reviewed: 02/01/2011 Exit[employer] Patient Information 2014 [place], [place].

## 2013-09-09 NOTE — Progress Notes (Signed)
Pre-visit discussion using our clinic review tool. No additional management support is needed unless otherwise documented below in the visit note.  

## 2013-09-09 NOTE — Progress Notes (Signed)
Subjective:    Patient ID: Alisha Long, female    DOB: [birth year], 63 y.o.   MRN: [id number]  HPI Here for health maintenance exam and to review chronic medical problems    Wt is up 5 lb with bmi of 28  Holiday eating   Now is getting back to eating better Plans to start back exercising  Takes care of grandchild several days per week  She occ gets cough with colds - getting over a cold- wants px for tessalon to have -it works well   Flu vaccine - did not have - declined  She does not think they work well   Td 8/04 - is due for that  She has heard about people who had the Tdap and had bad reactions involving the heart  Will do Td   Mammogram 10/14 Self exam - no lumps  Pap 8/13 -nl   And no gyn problems   Colonoscopy 11/12 - 5 year recall / due to polyps   Zoster vaccine 9 /13   Hyperlipidemia  Lab Results  Component Value Date   CHOL 257* 09/04/2013   CHOL 247* 04/08/2012   CHOL 231* 10/24/2010   Lab Results  Component Value Date   HDL 51.90 09/04/2013   HDL 56.60 04/08/2012   HDL 60.30 10/24/2010   Lab Results  Component Value Date   LDLCALC 128* 08/17/2008   Lab Results  Component Value Date   TRIG 191.0* 09/04/2013   TRIG 232.0* 04/08/2012   TRIG 176.0* 10/24/2010   Lab Results  Component Value Date   CHOLHDL 5 09/04/2013   CHOLHDL 4 04/08/2012   CHOLHDL 4 10/24/2010   Lab Results  Component Value Date   LDLDIRECT 166.4 09/04/2013   LDLDIRECT 159.9 04/08/2012   LDLDIRECT 154.6 10/24/2010   intol of statin medicines  Diet not perfect - some fatty food   Lab Results  Component Value Date   WBC 7.2 09/04/2013   HGB 14.7 09/04/2013   HCT 43.5 09/04/2013   MCV 94.1 09/04/2013   PLT 155.0 09/04/2013     Lab Results  Component Value Date   TSH 4.93 09/04/2013      Chemistry      Component Value Date/Time   NA 141 09/04/2013 0815   K 4.2 09/04/2013 0815   CL 106 09/04/2013 0815   CO2 29 09/04/2013 0815   BUN 16 09/04/2013 0815   CREATININE 0.9 09/04/2013 0815        Component Value Date/Time   CALCIUM 10.2 09/04/2013 0815   ALKPHOS 104 09/04/2013 0815   AST 20 09/04/2013 0815   ALT 19 09/04/2013 0815   BILITOT 0.5 09/04/2013 0815       No hx of OP  No hx of fractures    Patient Active Problem List   Diagnosis Date Noted  . Inflamed skin tag 04/17/2013  . Actinic keratosis 04/17/2013  . Anal skin tag 10/27/2012  . Skin tag of labia 10/27/2012  . Routine gynecological examination 04/15/2012  . Routine general medical examination at a health care facility 04/07/2012  . Earache on left 07/23/2011  . HEADACHE 11/01/2010  . COLONIC POLYPS 01/06/2010  . POSTNASAL DRIP 01/06/2010  . IRON DEFICIENCY ANEMIA, HX OF 11/07/2009  . OTHER SEBORRHEIC KERATOSIS 07/19/2009  . SYMPTOM, INSOMNIA NOS 06/18/2007  . HYPERLIPIDEMIA 06/11/2007  . Palpitations 06/11/2007   Past Medical History  Diagnosis Date  . Insomnia   . Palpitations   . Hyperlipidemia   .  Colon polyps   . Post-nasal drip     with chronic cough  . History of shingles    Past Surgical History  Procedure Laterality Date  . Cesarean section      x 3  . Abdominal hysterectomy      bleeding, ovaries intact  . Knee surgery      10/2000  . Knee surgery  01/2001    replaced ACL  . Carpal tunnel release  11/2004  . Cholecystectomy  12/2005  . Colonoscopy      polyps  . Gallstones      Abd [place]   . Esophagogastroduodenoscopy      nml   History  Substance Use Topics  . Smoking status: Former Research scientist (life sciences)  . Smokeless tobacco: Never Used  . Alcohol Use: No   Family History  Problem Relation Age of Onset  . Seizures Father    Allergies  Allergen Reactions  . Penicillins Shortness Of Breath    REACTION: hives  . Amoxicillin     REACTION: hives  . Atorvastatin     REACTION: joint pain  . Cephalexin     REACTION: hives  . Mometasone Furoate     REACTION: headache  . Rosuvastatin     REACTION: headache   Current Outpatient Prescriptions on File Prior to Visit  Medication Sig  Dispense Refill  . Calcium Carbonate-Vit D-Min (CALCIUM 1200) 1200-1000 MG-UNIT CHEW Chew 1 tablet by mouth daily.        . cholecalciferol (VITAMIN D) 1000 UNITS tablet Take 1,000 Units by mouth daily.        . cyclobenzaprine (FLEXERIL) 10 MG tablet Take 1/2 to one tablet by mouth up to three times a day as needed for headache.  Watch for sedation.  30 tablet  2  . fish oil-omega-3 fatty acids 1000 MG capsule Take 1 g by mouth daily.        . mometasone (ELOCON) 0.1 % cream Apply 1 application topically daily as needed.  50 g  0  . valACYclovir (VALTREX) 1000 MG tablet TAKE 2 TABLETS BY MOUTH 2 TIMES DAILY FOR ONE DAY *AS NEEDED FOR COLDSORES*  12 tablet  0  . vitamin E (E-400-CLEAR) 400 UNIT capsule Take 400 Units by mouth daily.         No current facility-administered medications on file prior to visit.       Review of Systems Review of Systems  Constitutional: Negative for fever, appetite change, fatigue and unexpected weight change.  Eyes: Negative for pain and visual disturbance.  Respiratory: Negative for cough and shortness of breath.   Cardiovascular: Negative for cp or palpitations    Gastrointestinal: Negative for nausea, diarrhea and constipation.  Genitourinary: Negative for urgency and frequency.  Skin: Negative for pallor or rash   Neurological: Negative for weakness, light-headedness, numbness and headaches.  Hematological: Negative for adenopathy. Does not bruise/bleed easily.  Psychiatric/Behavioral: Negative for dysphoric mood. The patient is not nervous/anxious.         Objective:   Physical Exam  Constitutional: She appears well-developed and well-nourished. No distress.  HENT:  Head: Normocephalic and atraumatic.  Right Ear: External ear normal.  Left Ear: External ear normal.  Mouth/Throat: Oropharynx is clear and moist.  Eyes: Conjunctivae and EOM are normal. Pupils are equal, round, and reactive to light. No scleral icterus.  Neck: Normal range of  motion. Neck supple. No JVD present. Carotid bruit is not present. No thyromegaly present.  Cardiovascular: Normal rate, regular  rhythm, normal heart sounds and intact distal pulses.  Exam reveals no gallop.   Pulmonary/Chest: Effort normal and breath sounds normal. No respiratory distress. She has no wheezes. She exhibits no tenderness.  Abdominal: Soft. Bowel sounds are normal. She exhibits no distension, no abdominal bruit and no mass. There is no tenderness.  Genitourinary: No breast swelling, tenderness, discharge or bleeding.  Breast exam: No mass, nodules, thickening, tenderness, bulging, retraction, inflamation, nipple discharge or skin changes noted.  No axillary or clavicular LA.      Musculoskeletal: Normal range of motion. She exhibits no edema and no tenderness.  Lymphadenopathy:    She has no cervical adenopathy.  Neurological: She is alert. She has normal reflexes. No cranial nerve deficit. She exhibits normal muscle tone. Coordination normal.  Skin: Skin is warm and dry. No rash noted. No erythema. No pallor.  lentigos and skin tags diffusely  Psychiatric: She has a normal mood and affect.          Assessment & Plan:

## 2013-09-10 NOTE — Assessment & Plan Note (Signed)
Pt is intol of statins and declines med Disc goals for lipids and reasons to control them Rev labs with pt Rev low sat fat diet in detail

## 2013-09-10 NOTE — Assessment & Plan Note (Signed)
Reviewed health habits including diet and exercise and skin cancer prevention Reviewed appropriate screening tests for age  Also reviewed health mt list, fam hx and immunization status , as well as social and family history   See HPI Lab rev Td given (pt declines flu vaccine or Tdap)

## 2013-11-19 ENCOUNTER — Ambulatory Visit (INDEPENDENT_AMBULATORY_CARE_PROVIDER_SITE_OTHER): Payer: 59 | Admitting: Internal Medicine

## 2013-11-19 ENCOUNTER — Encounter: Payer: Self-pay | Admitting: Internal Medicine

## 2013-11-19 VITALS — BP 126/88 | HR 72 | Temp 97.8°F | Wt 167.1 lb

## 2013-11-19 DIAGNOSIS — J309 Allergic rhinitis, unspecified: Secondary | ICD-10-CM

## 2013-11-19 MED ORDER — PREDNISONE 10 MG PO TABS: ORAL_TABLET | ORAL | Status: DC

## 2013-11-19 NOTE — Progress Notes (Addendum)
HPI  Pt presents to the clinic today with c/o nasal congestion, cough, runny nose and ear fullness. She reports this started 5 days ago. The cough is productive is productive of white mucous. The cough is more productive at night. She has tried mucinex and zyrtec. She denies fever, chills or body aches. She does have a history of allergies. She denies history of asthma.   Review of Systems    Past Medical History  Diagnosis Date  . Insomnia   . Palpitations   . Hyperlipidemia   . Colon polyps   . Post-nasal drip     with chronic cough  . History of shingles     Family History  Problem Relation Age of Onset  . Seizures Father     History   Social History  . Marital Status: Married    Spouse Name: N/A    Number of Children: 3  . Years of Education: N/A   Occupational History  . [place]  .     Social History Main Topics  . Smoking status: Former Research scientist (life sciences)  . Smokeless tobacco: Never Used  . Alcohol Use: No  . Drug Use: No  . Sexual Activity: Not on file   Other Topics Concern  . Not on file   Social History Narrative   Married, 3 children. Works for [employer], gets regular exercise.     Allergies  Allergen Reactions  . Penicillins Shortness Of Breath    REACTION: hives  . Amoxicillin     REACTION: hives  . Atorvastatin     REACTION: joint pain  . Cephalexin     REACTION: hives  . Mometasone Furoate     REACTION: headache  . Rosuvastatin     REACTION: headache     Constitutional: Positive headache. Denies fatigue, fever or abrupt weight changes.  HEENT:  Positive nasal congestion and sore throat. Denies eye redness, ear pain, ringing in the ears, wax buildup, runny nose or bloody nose. Respiratory: Positive cough. Denies difficulty breathing or shortness of breath.  Cardiovascular: Denies chest pain, chest tightness, palpitations or swelling in the hands or feet.   No other specific complaints in a complete review of  systems (except as listed in HPI above).  Objective:  BP 126/88  Pulse 72  Temp(Src) 97.8 F (36.6 C) (Tympanic)  Wt 167 lb 1.9 oz (75.805 kg)  SpO2 97%   General: Appears her stated age, well developed, well nourished in NAD. HEENT: Head: normal shape and size, sinus tenderness noted; Eyes: sclera white, no icterus, conjunctiva pink, PERRLA and EOMs intact; Ears: Tm's gray and intact, normal light reflex; Nose: mucosa pink and moist, septum midline; Throat/Mouth: + PND. Teeth present, mucosa pink and moist, no exudate noted, no lesions or ulcerations noted.  Neck: Neck supple, trachea midline. No massses, lumps or thyromegaly present.  Cardiovascular: Normal rate and rhythm. S1,S2 noted.  No murmur, rubs or gallops noted. No JVD or BLE edema. No carotid bruits noted. Pulmonary/Chest: Normal effort and bilateral expiratory wheeze. No respiratory distress. No wheezes, rales or ronchi noted.      Assessment & Plan:   Allergic Rhinitis  Continue zyrtec and mucinex OTC Will give eRx for pred taper Watch for fever, colored drainage or increased wheezing  RTC as needed or if symptoms persist.

## 2013-11-19 NOTE — Progress Notes (Signed)
Pre visit review using our clinic review tool, if applicable. No additional management support is needed unless otherwise documented below in the visit note. 

## 2013-11-19 NOTE — Patient Instructions (Addendum)

## 2014-02-25 ENCOUNTER — Encounter (HOSPITAL_COMMUNITY): Payer: Self-pay | Admitting: Emergency Medicine

## 2014-02-25 DIAGNOSIS — Z8639 Personal history of other endocrine, nutritional and metabolic disease: Secondary | ICD-10-CM | POA: Insufficient documentation

## 2014-02-25 DIAGNOSIS — Z860100 Personal history of colon polyps, unspecified: Secondary | ICD-10-CM | POA: Insufficient documentation

## 2014-02-25 DIAGNOSIS — Z880 Allergy status to penicillin: Secondary | ICD-10-CM | POA: Insufficient documentation

## 2014-02-25 DIAGNOSIS — Z862 Personal history of diseases of the blood and blood-forming organs and certain disorders involving the immune mechanism: Secondary | ICD-10-CM | POA: Insufficient documentation

## 2014-02-25 DIAGNOSIS — Z8619 Personal history of other infectious and parasitic diseases: Secondary | ICD-10-CM | POA: Insufficient documentation

## 2014-02-25 DIAGNOSIS — H9209 Otalgia, unspecified ear: Secondary | ICD-10-CM | POA: Insufficient documentation

## 2014-02-25 DIAGNOSIS — Z79899 Other long term (current) drug therapy: Secondary | ICD-10-CM | POA: Insufficient documentation

## 2014-02-25 DIAGNOSIS — Z87891 Personal history of nicotine dependence: Secondary | ICD-10-CM | POA: Insufficient documentation

## 2014-02-25 NOTE — ED Notes (Signed)
Pt. reports left ear ache onset this afternoon , denies injury , no drainage / fever or chills.

## 2014-02-26 ENCOUNTER — Emergency Department (HOSPITAL_COMMUNITY)
Admission: EM | Admit: 2014-02-26 | Discharge: 2014-02-26 | Disposition: A | Discharge status: Home / Self Care | Payer: 59 | Attending: Emergency Medicine | Admitting: Emergency Medicine

## 2014-02-26 DIAGNOSIS — H9202 Otalgia, left ear: Secondary | ICD-10-CM

## 2014-02-26 MED ORDER — SULFAMETHOXAZOLE-TRIMETHOPRIM 800-160 MG PO TABS: 1.0000 | ORAL_TABLET | Freq: Two times a day (BID) | ORAL | Status: DC

## 2014-02-26 MED ORDER — SULFAMETHOXAZOLE-TMP DS 800-160 MG PO TABS
1.0000 | ORAL_TABLET | Freq: Once | ORAL | Status: DC
  Filled 2014-02-26: qty 1

## 2014-02-26 MED ORDER — AZITHROMYCIN 250 MG PO TABS: 250.0000 mg | ORAL_TABLET | Freq: Every day | ORAL | Status: DC

## 2014-02-26 MED ORDER — ANTIPYRINE-BENZOCAINE 5.4-1.4 % OT SOLN
3.0000 [drp] | OTIC | Status: DC | PRN
  Administered 2014-02-26: 3 [drp] via OTIC
  Filled 2014-02-26: qty 10

## 2014-02-26 NOTE — Discharge Instructions (Signed)
Take the prescribed medication as directed.  Continue using ear drops as needed for discomfort. Follow-up with your primary care physician for re-check if symptoms persist. Return to the ED as needed.

## 2014-02-26 NOTE — ED Provider Notes (Signed)
CSN: [id number]     Arrival date & time 02/25/14  2305 History   First MD Initiated Contact with Patient 02/26/14 0007     Chief Complaint  Patient presents with  . Otalgia     (Consider location/radiation/quality/duration/timing/severity/associated sxs/prior Treatment) Patient is a 63 y.o. female presenting with ear pain. The history is provided by the patient and medical records.  Otalgia  This is a 63 y.o. F with past medical history significant for insomnia, hyperlipidemia, colonic polyps, presenting to the ED for left ear pain, onset this afternoon.  Pain is described as a throbbing sensation, worse along the inferior aspect of her ear canal. She denies any ear trauma, change in hearing, drainage from the ear, dizziness, tinnitus, visual disturbance, or headache.  No sore throat, fever, chills, sweats, nasal congestion, or other URI sx.  No prior ear issues.  Used OTC ear wax drops at home PTA without noted improvement.  Past Medical History  Diagnosis Date  . Insomnia   . Palpitations   . Hyperlipidemia   . Colon polyps   . Post-nasal drip     with chronic cough  . History of shingles    Past Surgical History  Procedure Laterality Date  . Cesarean section      x 3  . Abdominal hysterectomy      bleeding, ovaries intact  . Knee surgery      10/2000  . Knee surgery  01/2001    replaced ACL  . Carpal tunnel release  11/2004  . Cholecystectomy  12/2005  . Colonoscopy      polyps  . Gallstones      Abd [place]   . Esophagogastroduodenoscopy      nml   Family History  Problem Relation Age of Onset  . Seizures Father    History  Substance Use Topics  . Smoking status: Former Research scientist (life sciences)  . Smokeless tobacco: Never Used  . Alcohol Use: No   OB History   Grav Para Term Preterm Abortions TAB SAB Ect Mult Living                 Review of Systems  HENT: Positive for ear pain.   All other systems reviewed and are negative.     Allergies  Penicillins; Amoxicillin;  Atorvastatin; Cephalexin; Mometasone furoate; and Rosuvastatin  Home Medications   Prior to Admission medications   Medication Sig Start Date End Date Taking? Authorizing Provider  benzonatate (TESSALON) 200 MG capsule Take 1 capsule (200 mg total) by mouth 3 (three) times daily as needed for cough. 09/09/13   Abner Greenspan, MD  Calcium Carbonate-Vit D-Min (CALCIUM 1200) 1200-1000 MG-UNIT CHEW Chew 1 tablet by mouth daily.      Historical Provider, MD  cholecalciferol (VITAMIN D) 1000 UNITS tablet Take 1,000 Units by mouth daily.      Historical Provider, MD  cyclobenzaprine (FLEXERIL) 10 MG tablet Take 1/2 to one tablet by mouth up to three times a day as needed for headache.  Watch for sedation. 03/24/13   Abner Greenspan, MD  fish oil-omega-3 fatty acids 1000 MG capsule Take 1 g by mouth daily.      Historical Provider, MD  mometasone (ELOCON) 0.1 % cream Apply 1 application topically daily as needed. 03/24/13   Abner Greenspan, MD  predniSONE (DELTASONE) 10 MG tablet Take 3 tabs on days 1-2, take 2 tabs on days 3-4, take 1 tab on days 5-6 11/19/13   Webb Silversmith, NP  valACYclovir (  VALTREX) 1000 MG tablet TAKE 2 TABLETS BY MOUTH 2 TIMES DAILY FOR ONE DAY *AS NEEDED FOR COLDSORES* 09/09/13   Abner Greenspan, MD  vitamin E (E-400-CLEAR) 400 UNIT capsule Take 400 Units by mouth daily.      Historical Provider, MD   BP 129/71  Pulse 63  Temp(Src) 98.2 F (36.8 C) (Oral)  Resp 14  Ht 5\' 6"  (1.676 m)  Wt 165 lb (74.844 kg)  BMI 26.64 kg/m2  SpO2 97%  Physical Exam  Nursing note and vitals reviewed. Constitutional: She is oriented to person, place, and time. She appears well-developed and well-nourished.  HENT:  Head: Normocephalic and atraumatic.  Right Ear: Tympanic membrane and ear canal normal.  Left Ear: Tympanic membrane normal.  Nose: Nose normal.  Mouth/Throat: Uvula is midline, oropharynx is clear and moist and mucous membranes are normal. No oropharyngeal exudate, posterior oropharyngeal  edema, posterior oropharyngeal erythema or tonsillar abscesses.  Right ear WNL Left ear with mild erythema in EAC; no canal swelling or lesions noted; TM appears normal without signs of perforation; no drainage; hearing WNL; no pain with movement of ear or pressure applied to tragus; no mastoid tenderness  Eyes: Conjunctivae and EOM are normal. Pupils are equal, round, and reactive to light.  Neck: Normal range of motion.  Cardiovascular: Normal rate, regular rhythm and normal heart sounds.   Pulmonary/Chest: Effort normal and breath sounds normal.  Abdominal: Soft. Bowel sounds are normal.  Musculoskeletal: Normal range of motion.  Neurological: She is alert and oriented to person, place, and time.  AAOx3, answering questions appropriately; equal strength UE and LE bilaterally; CN grossly intact; moves all extremities appropriately without ataxia; no focal neuro deficits or facial asymmetry appreciated  Skin: Skin is warm and dry.  Psychiatric: She has a normal mood and affect.    ED Course  Procedures (including critical care time) Labs Review Labs Reviewed - No data to display  Imaging Review No results found.   EKG Interpretation None      MDM   Final diagnoses:  Left ear pain   63 y.o. F with left ear pain, onset this afternoon, without other associated sx.  Exam largely unremarkable, slight erythema of left EAC-- questionable early OM?  No dizziness, tinnitus, or CN deficit to suggest intracranial pathology.  No herpetic lesions to suggest shingles/Ramsay Hunt.  Hearing WNL.  Will cover with azithromycin (pt has allergies to penicillin, cephalosporins, and sulfa) and auralgan drops for comfort.  Will FU with PCP for re-check.  Discussed plan with patient, he/she acknowledged understanding and agreed with plan of care.  Return precautions given for new or worsening symptoms.  Larene Pickett, PA-C 02/26/14 [phone]

## 2014-03-01 ENCOUNTER — Encounter: Payer: Self-pay | Admitting: Internal Medicine

## 2014-03-01 ENCOUNTER — Ambulatory Visit (INDEPENDENT_AMBULATORY_CARE_PROVIDER_SITE_OTHER): Payer: 59 | Admitting: Internal Medicine

## 2014-03-01 VITALS — BP 118/70 | HR 68 | Wt 163.0 lb

## 2014-03-01 DIAGNOSIS — H6090 Unspecified otitis externa, unspecified ear: Secondary | ICD-10-CM | POA: Insufficient documentation

## 2014-03-01 DIAGNOSIS — H6092 Unspecified otitis externa, left ear: Secondary | ICD-10-CM

## 2014-03-01 DIAGNOSIS — H60399 Other infective otitis externa, unspecified ear: Secondary | ICD-10-CM

## 2014-03-01 MED ORDER — NEOMYCIN-POLYMYXIN-HC 3.5-10000-1 OT SUSP: 4.0000 [drp] | Freq: Four times a day (QID) | OTIC | Status: DC

## 2014-03-01 NOTE — Assessment & Plan Note (Signed)
No evidence of systemic infection or OM Don't take the z-pak Will give cortisporin otic

## 2014-03-01 NOTE — Progress Notes (Signed)
Subjective:    Patient ID: Alisha Long, female    DOB: 11-18-1950, 63 y.o.   MRN: [id number]  HPI Seen in ER 3 days ago Still not better  4 days ago, in mid afternoon, used ear wax wash for pain Needed immediate care due to leaving soon for vacation Given auralgan and this helped some Got Rx for z-pak---didn't take it  Tylenol and ibuprofen do hold off her pain Pain mostly in evening No swimming before or after symptoms beginning  Current Outpatient Prescriptions on File Prior to Visit  Medication Sig Dispense Refill  . Calcium Carbonate-Vit D-Min (CALCIUM 1200) 1200-1000 MG-UNIT CHEW Chew 1 tablet by mouth daily.        . cholecalciferol (VITAMIN D) 1000 UNITS tablet Take 1,000 Units by mouth daily.        . fish oil-omega-3 fatty acids 1000 MG capsule Take 1 g by mouth daily.        . vitamin E (E-400-CLEAR) 400 UNIT capsule Take 400 Units by mouth daily.         No current facility-administered medications on file prior to visit.    Allergies  Allergen Reactions  . Penicillins Shortness Of Breath    REACTION: hives  . Amoxicillin     REACTION: hives  . Atorvastatin     REACTION: joint pain  . Bactrim [Sulfamethoxazole-Tmp Ds] Hives  . Cephalexin     REACTION: hives  . Mometasone Furoate     REACTION: headache  . Rosuvastatin     REACTION: headache    Past Medical History  Diagnosis Date  . Insomnia   . Palpitations   . Hyperlipidemia   . Colon polyps   . Post-nasal drip     with chronic cough  . History of shingles     Past Surgical History  Procedure Laterality Date  . Cesarean section      x 3  . Abdominal hysterectomy      bleeding, ovaries intact  . Knee surgery      10/2000  . Knee surgery  01/2001    replaced ACL  . Carpal tunnel release  11/2004  . Cholecystectomy  12/2005  . Colonoscopy      polyps  . Gallstones      Abd [place]   . Esophagogastroduodenoscopy      nml    Family History  Problem Relation Age of Onset  . Seizures  Father     History   Social History  . Marital Status: Married    Spouse Name: N/A    Number of Children: 3  . Years of Education: N/A   Occupational History  . [employer]  .     Social History Main Topics  . Smoking status: Former Research scientist (life sciences)  . Smokeless tobacco: Never Used  . Alcohol Use: No  . Drug Use: No  . Sexual Activity: Not on file   Other Topics Concern  . Not on file   Social History Narrative   Married, 3 children. Works for [employer], gets regular exercise.    Review of Systems No fever No URI symptoms No new hair or head products    Objective:   Physical Exam  Constitutional: She appears well-developed and well-nourished. No distress.  HENT:  Mouth/Throat: Oropharynx is clear and moist. No oropharyngeal exudate.  Mild left tragal tenderness TM normal Slight redness on superior canal  Right side normal  Neck: Normal range of motion. Neck supple.  Lymphadenopathy:    She has no cervical adenopathy.          Assessment & Plan:

## 2014-03-08 NOTE — ED Provider Notes (Signed)
Medical screening examination/treatment/procedure(s) were performed by non-physician practitioner and as supervising physician I was immediately available for consultation/collaboration.   EKG Interpretation None        Elyn Peers, MD 03/08/14 [phone]

## 2014-03-28 ENCOUNTER — Other Ambulatory Visit: Payer: Self-pay | Admitting: Family Medicine

## 2014-03-29 NOTE — Telephone Encounter (Signed)
Please refill times 5 

## 2014-03-29 NOTE — Telephone Encounter (Signed)
Received refill request electronically. Last refill 06/01/13 #12, last office visit 03/01/14/acute. Is it okay to refill medication?

## 2014-03-29 NOTE — Telephone Encounter (Signed)
Refill sent to pharmacy as instructed. 

## 2014-05-10 ENCOUNTER — Other Ambulatory Visit: Payer: Self-pay

## 2014-05-10 DIAGNOSIS — Z1231 Encounter for screening mammogram for malignant neoplasm of breast: Secondary | ICD-10-CM

## 2014-06-10 ENCOUNTER — Ambulatory Visit
Admission: RE | Admit: 2014-06-10 | Discharge: 2014-06-10 | Disposition: A | Discharge status: Home / Self Care | Payer: 59 | Source: Ambulatory Visit

## 2014-06-10 DIAGNOSIS — Z1231 Encounter for screening mammogram for malignant neoplasm of breast: Secondary | ICD-10-CM

## 2014-06-30 ENCOUNTER — Ambulatory Visit (INDEPENDENT_AMBULATORY_CARE_PROVIDER_SITE_OTHER): Payer: 59

## 2014-06-30 DIAGNOSIS — Z23 Encounter for immunization: Secondary | ICD-10-CM

## 2014-10-26 ENCOUNTER — Ambulatory Visit: Payer: Self-pay | Admitting: Family Medicine

## 2015-03-01 ENCOUNTER — Other Ambulatory Visit: Payer: Self-pay | Admitting: Family Medicine

## 2015-03-02 NOTE — Telephone Encounter (Signed)
Electronic refill request, no recent/future appt., last OV with you was a CPE on 09/09/13, please advise

## 2015-03-02 NOTE — Telephone Encounter (Signed)
Please schedule PE and refill times 5

## 2015-03-02 NOTE — Telephone Encounter (Signed)
appt scheduled and med refilled 

## 2015-05-04 ENCOUNTER — Telehealth (INDEPENDENT_AMBULATORY_CARE_PROVIDER_SITE_OTHER): Payer: 59 | Admitting: Family Medicine

## 2015-05-04 ENCOUNTER — Other Ambulatory Visit (INDEPENDENT_AMBULATORY_CARE_PROVIDER_SITE_OTHER): Payer: 59

## 2015-05-04 DIAGNOSIS — R7989 Other specified abnormal findings of blood chemistry: Secondary | ICD-10-CM | POA: Diagnosis not present

## 2015-05-04 DIAGNOSIS — Z0000 Encounter for general adult medical examination without abnormal findings: Secondary | ICD-10-CM

## 2015-05-04 LAB — LDL CHOLESTEROL, DIRECT: LDL DIRECT: 162 mg/dL

## 2015-05-04 LAB — CBC WITH DIFFERENTIAL/PLATELET
BASOS ABS: 0 10*3/uL (ref 0.0–0.1)
Basophils Relative: 0.6 % (ref 0.0–3.0)
Eosinophils Absolute: 0.1 10*3/uL (ref 0.0–0.7)
Eosinophils Relative: 1.8 % (ref 0.0–5.0)
HCT: 43.9 % (ref 36.0–46.0)
HEMOGLOBIN: 14.7 g/dL (ref 12.0–15.0)
LYMPHS ABS: 2.1 10*3/uL (ref 0.7–4.0)
Lymphocytes Relative: 30.4 % (ref 12.0–46.0)
MCHC: 33.4 g/dL (ref 30.0–36.0)
MCV: 94.5 fl (ref 78.0–100.0)
MONO ABS: 0.5 10*3/uL (ref 0.1–1.0)
MONOS PCT: 7.5 % (ref 3.0–12.0)
NEUTROS PCT: 59.7 % (ref 43.0–77.0)
Neutro Abs: 4.1 10*3/uL (ref 1.4–7.7)
Platelets: 162 10*3/uL (ref 150.0–400.0)
RBC: 4.65 Mil/uL (ref 3.87–5.11)
RDW: 13.1 % (ref 11.5–15.5)
WBC: 6.9 10*3/uL (ref 4.0–10.5)

## 2015-05-04 LAB — COMPREHENSIVE METABOLIC PANEL
ALBUMIN: 4.3 g/dL (ref 3.5–5.2)
ALK PHOS: 120 U/L — AB (ref 39–117)
ALT: 19 U/L (ref 0–35)
AST: 19 U/L (ref 0–37)
BILIRUBIN TOTAL: 0.7 mg/dL (ref 0.2–1.2)
BUN: 14 mg/dL (ref 6–23)
CO2: 32 mEq/L (ref 19–32)
CREATININE: 0.81 mg/dL (ref 0.40–1.20)
Calcium: 10.6 mg/dL — ABNORMAL HIGH (ref 8.4–10.5)
Chloride: 103 mEq/L (ref 96–112)
GFR: 75.88 mL/min (ref >60.00)
GLUCOSE: 97 mg/dL (ref 70–99)
Potassium: 4.2 mEq/L (ref 3.5–5.1)
SODIUM: 141 meq/L (ref 135–145)
TOTAL PROTEIN: 6.6 g/dL (ref 6.0–8.3)

## 2015-05-04 LAB — LIPID PANEL
Cholesterol: 237 mg/dL — ABNORMAL HIGH (ref 0–200)
HDL: 53.1 mg/dL (ref >39.00)
NONHDL: 183.84
Total CHOL/HDL Ratio: 4
Triglycerides: 230 mg/dL — ABNORMAL HIGH (ref 0.0–149.0)
VLDL: 46 mg/dL — ABNORMAL HIGH (ref 0.0–40.0)

## 2015-05-04 LAB — TSH: TSH: 3.82 u[IU]/mL (ref 0.35–4.50)

## 2015-05-04 NOTE — Telephone Encounter (Signed)
-----   Message from Ellamae Sia sent at 04/28/2015 10:08 AM EDT ----- Regarding: Lab orders for Wednesday, 9.7.16 Patient is scheduled for CPX labs, please order future labs, Thanks , Karna Christmas

## 2015-05-13 ENCOUNTER — Ambulatory Visit (INDEPENDENT_AMBULATORY_CARE_PROVIDER_SITE_OTHER): Payer: 59 | Admitting: Family Medicine

## 2015-05-13 ENCOUNTER — Encounter: Payer: Self-pay | Admitting: Family Medicine

## 2015-05-13 ENCOUNTER — Other Ambulatory Visit (HOSPITAL_COMMUNITY)
Admission: RE | Admit: 2015-05-13 | Discharge: 2015-05-13 | Disposition: A | Discharge status: Home / Self Care | Payer: 59 | Source: Ambulatory Visit | Attending: Family Medicine | Admitting: Family Medicine

## 2015-05-13 VITALS — BP 124/82 | HR 64 | Temp 98.4°F | Ht 64.75 in | Wt 165.5 lb

## 2015-05-13 DIAGNOSIS — Z01419 Encounter for gynecological examination (general) (routine) without abnormal findings: Secondary | ICD-10-CM

## 2015-05-13 DIAGNOSIS — Z23 Encounter for immunization: Secondary | ICD-10-CM | POA: Diagnosis not present

## 2015-05-13 DIAGNOSIS — Z0000 Encounter for general adult medical examination without abnormal findings: Secondary | ICD-10-CM

## 2015-05-13 DIAGNOSIS — E785 Hyperlipidemia, unspecified: Secondary | ICD-10-CM

## 2015-05-13 DIAGNOSIS — Z1151 Encounter for screening for human papillomavirus (HPV): Secondary | ICD-10-CM | POA: Insufficient documentation

## 2015-05-13 LAB — HM PAP SMEAR: HM PAP: NORMAL

## 2015-05-13 NOTE — Patient Instructions (Addendum)
Don't forget to schedule your mammogram  Pap/gyn exam done today Flu shot today  If you decide that you want an EKG or bone density test please let us know

## 2015-05-13 NOTE — Progress Notes (Signed)
Subjective:    Patient ID: Alisha Long, female    DOB: 10/12/1950, 64 y.o.   MRN: [id number]  HPI Here for health maintenance exam and to review chronic medical problems    Doing well  Had a good summer  Went to [place]   Wt is up 2 lb with bmi of 27 Eating healthy Exercising   Hep C/HIV screening -not interested /low risk   Flu shot today  Gyn exam 8/13 with pap nl  Will do that today  No gyn c/o or problems    Mammogram 10/15 neg-will schedule your  Self exam- no lumps or changes   colonsosc 11/12 -adenom polyp  Due in 11/17   Tdap 1/15  Zoster vaccine 9/13  Cholesterol Lab Results  Component Value Date   CHOL 237* 05/04/2015   CHOL 257* 09/04/2013   CHOL 247* 04/08/2012   Lab Results  Component Value Date   HDL 53.10 05/04/2015   HDL 51.90 09/04/2013   HDL 56.60 04/08/2012   Lab Results  Component Value Date   LDLCALC 128* 08/17/2008   Lab Results  Component Value Date   TRIG 230.0* 05/04/2015   TRIG 191.0* 09/04/2013   TRIG 232.0* 04/08/2012   Lab Results  Component Value Date   CHOLHDL 4 05/04/2015   CHOLHDL 5 09/04/2013   CHOLHDL 4 04/08/2012   Lab Results  Component Value Date   LDLDIRECT 162.0 05/04/2015   LDLDIRECT 166.4 09/04/2013   LDLDIRECT 159.9 04/08/2012   pretty close to last time  Cannot take statin med at all  She does watch her diet     Results for orders placed or performed in visit on 05/04/15  CBC with Differential/Platelet  Result Value Ref Range   WBC 6.9 4.0 - 10.5 K/uL   RBC 4.65 3.87 - 5.11 Mil/uL   Hemoglobin 14.7 12.0 - 15.0 g/dL   HCT 43.9 36.0 - 46.0 %   MCV 94.5 78.0 - 100.0 fl   MCHC 33.4 30.0 - 36.0 g/dL   RDW 13.1 11.5 - 15.5 %   Platelets 162.0 150.0 - 400.0 K/uL   Neutrophils Relative % 59.7 43.0 - 77.0 %   Lymphocytes Relative 30.4 12.0 - 46.0 %   Monocytes Relative 7.5 3.0 - 12.0 %   Eosinophils Relative 1.8 0.0 - 5.0 %   Basophils Relative 0.6 0.0 - 3.0 %   Neutro Abs 4.1 1.4 -  7.7 K/uL   Lymphs Abs 2.1 0.7 - 4.0 K/uL   Monocytes Absolute 0.5 0.1 - 1.0 K/uL   Eosinophils Absolute 0.1 0.0 - 0.7 K/uL   Basophils Absolute 0.0 0.0 - 0.1 K/uL  Comprehensive metabolic panel  Result Value Ref Range   Sodium 141 135 - 145 mEq/L   Potassium 4.2 3.5 - 5.1 mEq/L   Chloride 103 96 - 112 mEq/L   CO2 32 19 - 32 mEq/L   Glucose, Bld 97 70 - 99 mg/dL   BUN 14 6 - 23 mg/dL   Creatinine, Ser 0.81 0.40 - 1.20 mg/dL   Total Bilirubin 0.7 0.2 - 1.2 mg/dL   Alkaline Phosphatase 120 (H) 39 - 117 U/L   AST 19 0 - 37 U/L   ALT 19 0 - 35 U/L   Total Protein 6.6 6.0 - 8.3 g/dL   Albumin 4.3 3.5 - 5.2 g/dL   Calcium 10.6 (H) 8.4 - 10.5 mg/dL   GFR 75.88 >60.00 mL/min  TSH  Result Value Ref Range   TSH  3.82 0.35 - 4.50 uIU/mL  Lipid panel  Result Value Ref Range   Cholesterol 237 (H) 0 - 200 mg/dL   Triglycerides 230.0 (H) 0.0 - 149.0 mg/dL   HDL 53.10 >39.00 mg/dL   VLDL 46.0 (H) 0.0 - 40.0 mg/dL   Total CHOL/HDL Ratio 4    NonHDL 183.84   LDL cholesterol, direct  Result Value Ref Range   Direct LDL 162.0 mg/dL      Review of Systems Review of Systems  Constitutional: Negative for fever, appetite change, fatigue and unexpected weight change.  Eyes: Negative for pain and visual disturbance.  Respiratory: Negative for cough and shortness of breath.   Cardiovascular: Negative for cp or palpitations    Gastrointestinal: Negative for nausea, diarrhea and constipation.  Genitourinary: Negative for urgency and frequency.  Skin: Negative for pallor or rash   Neurological: Negative for weakness, light-headedness, numbness and headaches.  Hematological: Negative for adenopathy. Does not bruise/bleed easily.  Psychiatric/Behavioral: Negative for dysphoric mood. The patient is not nervous/anxious.         Objective:   Physical Exam  Constitutional: She appears well-developed and well-nourished. No distress.  Well appearing   HENT:  Head: Normocephalic and atraumatic.    Right Ear: External ear normal.  Left Ear: External ear normal.  Mouth/Throat: Oropharynx is clear and moist.  Eyes: Conjunctivae and EOM are normal. Pupils are equal, round, and reactive to light. No scleral icterus.  Neck: Normal range of motion. Neck supple. No JVD present. Carotid bruit is not present. No thyromegaly present.  Cardiovascular: Normal rate, regular rhythm, normal heart sounds and intact distal pulses.  Exam reveals no gallop.   Pulmonary/Chest: Effort normal and breath sounds normal. No respiratory distress. She has no wheezes. She exhibits no tenderness.  Abdominal: Soft. Bowel sounds are normal. She exhibits no distension, no abdominal bruit and no mass. There is no tenderness.  Genitourinary: Vagina normal and uterus normal. No breast swelling, tenderness, discharge or bleeding. There is no rash, tenderness or lesion on the right labia. There is no rash, tenderness or lesion on the left labia. No tenderness in the vagina. No vaginal discharge found.  Breast exam: No mass, nodules, thickening, tenderness, bulging, retraction, inflamation, nipple discharge or skin changes noted.  No axillary or clavicular LA.     Uterus/cervix are surgically absent   Musculoskeletal: Normal range of motion. She exhibits no edema or tenderness.  Lymphadenopathy:    She has no cervical adenopathy.  Neurological: She is alert. She has normal reflexes. No cranial nerve deficit. She exhibits normal muscle tone. Coordination normal.  Skin: Skin is warm and dry. No rash noted. No erythema. No pallor.  Psychiatric: She has a normal mood and affect.          Assessment & Plan:   Problem List Items Addressed This Visit      Other   Encounter for routine gynecological examination    Routine exam with pap  Post menopausal with no problems  S/p hysterectomy       Relevant Orders   Cytology - PAP   Hyperlipidemia    Disc goals for lipids and reasons to control them Rev labs with pt Rev  low sat fat diet in detail  Pt declines statin meds-does not tolerate them  Doubt she would qualify for aide with a med like Repatha- but this may be an option in the future if it becomes more affordable       Routine general medical examination  at a health care facility - Primary    Reviewed health habits including diet and exercise and skin cancer prevention Reviewed appropriate screening tests for age  Also reviewed health mt list, fam hx and immunization status , as well as social and family history   See HPI Labs rev  Pt unsure if she wants a bone density test - will check with insurance about coverage  Don't forget to schedule your mammogram  Pap/gyn exam done today Flu shot today       Other Visit Diagnoses    Need for influenza vaccination        Relevant Orders    Flu Vaccine QUAD 36+ mos PF IM (Fluarix & Fluzone Quad PF) (Completed)

## 2015-05-13 NOTE — Progress Notes (Signed)
Pre visit review using our clinic review tool, if applicable. No additional management support is needed unless otherwise documented below in the visit note. 

## 2015-05-15 NOTE — Assessment & Plan Note (Signed)
Disc goals for lipids and reasons to control them Rev labs with pt Rev low sat fat diet in detail  Pt declines statin meds-does not tolerate them  Doubt she would qualify for aide with a med like Repatha- but this may be an option in the future if it becomes more affordable

## 2015-05-15 NOTE — Assessment & Plan Note (Signed)
Routine exam with pap  Post menopausal with no problems  S/p hysterectomy

## 2015-05-15 NOTE — Assessment & Plan Note (Signed)
Reviewed health habits including diet and exercise and skin cancer prevention Reviewed appropriate screening tests for age  Also reviewed health mt list, fam hx and immunization status , as well as social and family history   See HPI Labs rev  Pt unsure if she wants a bone density test - will check with insurance about coverage  Don't forget to schedule your mammogram  Pap/gyn exam done today Flu shot today

## 2015-05-16 ENCOUNTER — Other Ambulatory Visit: Payer: Self-pay

## 2015-05-16 DIAGNOSIS — Z1231 Encounter for screening mammogram for malignant neoplasm of breast: Secondary | ICD-10-CM

## 2015-05-16 LAB — CYTOLOGY - PAP

## 2015-05-18 ENCOUNTER — Encounter: Payer: Self-pay | Admitting: *Deleted

## 2015-05-23 ENCOUNTER — Encounter: Payer: Self-pay | Admitting: Family Medicine

## 2015-05-24 ENCOUNTER — Telehealth: Payer: Self-pay | Admitting: Family Medicine

## 2015-05-24 DIAGNOSIS — E2839 Other primary ovarian failure: Secondary | ICD-10-CM | POA: Insufficient documentation

## 2015-05-24 NOTE — Telephone Encounter (Signed)
Pt found out ins covers dexa so I will order it for screening

## 2015-06-16 ENCOUNTER — Ambulatory Visit: Payer: 59

## 2015-06-30 ENCOUNTER — Ambulatory Visit
Admission: RE | Admit: 2015-06-30 | Discharge: 2015-06-30 | Disposition: A | Discharge status: Home / Self Care | Payer: 59 | Source: Ambulatory Visit

## 2015-06-30 ENCOUNTER — Ambulatory Visit
Admission: RE | Admit: 2015-06-30 | Discharge: 2015-06-30 | Disposition: A | Discharge status: Home / Self Care | Payer: 59 | Source: Ambulatory Visit | Attending: Family Medicine | Admitting: Family Medicine

## 2015-06-30 DIAGNOSIS — E2839 Other primary ovarian failure: Secondary | ICD-10-CM

## 2015-06-30 DIAGNOSIS — Z1231 Encounter for screening mammogram for malignant neoplasm of breast: Secondary | ICD-10-CM

## 2015-09-01 ENCOUNTER — Encounter: Payer: Self-pay | Admitting: Family Medicine

## 2015-09-28 ENCOUNTER — Encounter: Payer: Self-pay | Admitting: Family Medicine

## 2015-09-28 ENCOUNTER — Ambulatory Visit (INDEPENDENT_AMBULATORY_CARE_PROVIDER_SITE_OTHER): Payer: 59 | Admitting: Family Medicine

## 2015-09-28 VITALS — BP 136/78 | HR 73 | Temp 98.1°F | Wt 168.5 lb

## 2015-09-28 DIAGNOSIS — J069 Acute upper respiratory infection, unspecified: Secondary | ICD-10-CM | POA: Diagnosis not present

## 2015-09-28 NOTE — Progress Notes (Signed)
SUBJECTIVE:  Alisha Long is a 65 y.o. female pt of Dr. Glori Bickers, new to me who complains of congestion, post nasal drip and productive cough for 4 days.  Has been taking OTC Mucinex and Tylenol without much relief.  Pos sick contact- grand daughter.   She denies a history of anorexia, chest pain, chills, dizziness and fevers and denies a history of asthma. Patient denies smoke cigarettes.   PMH significant for multiple abx allergies.  Current Outpatient Prescriptions on File Prior to Visit  Medication Sig Dispense Refill  . B Complex-C (SUPER B COMPLEX PO) Take 1 capsule by mouth daily.    . cholecalciferol (VITAMIN D) 1000 UNITS tablet Take 1,000 Units by mouth daily.      . Multiple Vitamins-Minerals (MULTIVITAMIN ADULTS 50+ PO) Take 1 capsule by mouth daily.    . valACYclovir (VALTREX) 1000 MG tablet TAKE 2 TABLET BY MOUTH 2 TIMES DAILY AS NEEDED FOR COLD SORES FOR 1 DAY 12 tablet 4   No current facility-administered medications on file prior to visit.    Allergies  Allergen Reactions  . Penicillins Shortness Of Breath    REACTION: hives  . Amoxicillin     REACTION: hives  . Atorvastatin     REACTION: joint pain  . Bactrim [Sulfamethoxazole-Trimethoprim] Hives  . Cephalexin     REACTION: hives  . Mometasone Furoate     REACTION: headache  . Rosuvastatin     REACTION: headache    Past Medical History  Diagnosis Date  . Insomnia   . Palpitations   . Hyperlipidemia   . Colon polyps   . Post-nasal drip     with chronic cough  . History of shingles     Past Surgical History  Procedure Laterality Date  . Cesarean section      x 3  . Abdominal hysterectomy      bleeding, ovaries intact  . Knee surgery      10/2000  . Knee surgery  01/2001    replaced ACL  . Carpal tunnel release  11/2004  . Cholecystectomy  12/2005  . Colonoscopy      polyps  . Gallstones      Abd [place]   . Esophagogastroduodenoscopy      nml    Family History  Problem Relation Age of Onset   . Seizures Father     Social History   Social History  . Marital Status: Married    Spouse Name: N/A  . Number of Children: 3  . Years of Education: N/A   Occupational History  . [place]  .     Social History Main Topics  . Smoking status: Former Research scientist (life sciences)  . Smokeless tobacco: Never Used  . Alcohol Use: No  . Drug Use: No  . Sexual Activity: Not on file   Other Topics Concern  . Not on file   Social History Narrative   Married, 3 children. Works for [employer], gets regular exercise.    The PMH, PSH, Social History, Family History, Medications, and allergies have been reviewed in [facility], and have been updated if relevant.  OBJECTIVE:  BP 136/78 mmHg  Pulse 73  Temp(Src) 98.1 F (36.7 C) (Oral)  Wt 168 lb 8 oz (76.431 kg)  SpO2 99%  She appears well, vital signs are as noted. Ears normal.  Throat and pharynx normal.  Neck supple. No adenopathy in the neck. Nose is congested. Sinuses non tender. The chest is clear, without wheezes  or rales.  ASSESSMENT:  viral upper respiratory illness  PLAN: Symptomatic therapy suggested: push fluids, rest and return office visit prn if symptoms persist or worsen. Lack of antibiotic effectiveness discussed with her. Call or return to clinic prn if these symptoms worsen or fail to improve as anticipated.

## 2015-09-28 NOTE — Patient Instructions (Signed)
    Treat sympotmatically with Mucinex, nasal saline irrigation, and Tylenol/Ibuprofen.   Also try an antihistamine/decongestant like claritin D or zyrtec D over the counter- two times a day as needed ( have to sign for them at pharmacy).   Try over the counter nasocort-start with 2 sprays per nostril per day...and then try to taper to 1 spray per nostril once symptoms improve.     Call if not improving as expected in 5-7 days.

## 2015-09-28 NOTE — Progress Notes (Signed)
Pre visit review using our clinic review tool, if applicable. No additional management support is needed unless otherwise documented below in the visit note. 

## 2015-12-01 ENCOUNTER — Other Ambulatory Visit: Payer: Self-pay | Admitting: Orthopaedic Surgery

## 2015-12-01 DIAGNOSIS — M25561 Pain in right knee: Secondary | ICD-10-CM

## 2015-12-06 ENCOUNTER — Ambulatory Visit
Admission: RE | Admit: 2015-12-06 | Discharge: 2015-12-06 | Disposition: A | Discharge status: Home / Self Care | Payer: 59 | Source: Ambulatory Visit | Attending: Orthopaedic Surgery | Admitting: Orthopaedic Surgery

## 2015-12-06 DIAGNOSIS — M25561 Pain in right knee: Secondary | ICD-10-CM

## 2015-12-15 ENCOUNTER — Encounter: Payer: Self-pay | Admitting: Gastroenterology

## 2016-02-08 ENCOUNTER — Telehealth: Payer: Self-pay

## 2016-02-08 NOTE — Telephone Encounter (Signed)
Pt left v/m; pt recently had surgery and has had some high BP readings at office visits; pt request order for BP machine to ck BP at home to see if white coat syndrome. Pt did not know if rx for BP machine might cost the pt less. Pt request cb. Last annual 05/13/15. Does pt need to be seen?

## 2016-02-08 NOTE — Telephone Encounter (Signed)
My favorite bp cuff in terms of accuracy is OMRON meter - for the arm (not wrist) size regular  If her insurance will cover one - let me know, but the generally do not  Check bp while relaxed with arm resting on table at heart level   F/u if bp persist 140/90s or above   Watch sodium in diet

## 2016-02-09 NOTE — Telephone Encounter (Signed)
Pt notified of Dr. Marliss Coots comments and verbalized understanding, she will check with her insurance and see if they do cover one but if they don't she will get the Omron cuff and keep track of BP and f/u is it's remaining high

## 2016-03-11 ENCOUNTER — Other Ambulatory Visit: Payer: Self-pay | Admitting: Family Medicine

## 2016-03-12 NOTE — Telephone Encounter (Signed)
Received refill request electronically Last refill 03/02/15 #12/4 Last office visit 09/28/15/acute

## 2016-03-12 NOTE — Telephone Encounter (Signed)
Refill sent to pharmacy as instructed. 

## 2016-03-12 NOTE — Telephone Encounter (Signed)
Please refill times 5 

## 2016-05-02 ENCOUNTER — Telehealth: Payer: Self-pay | Admitting: Family Medicine

## 2016-05-02 NOTE — Telephone Encounter (Signed)
Pt would like to have cpe before end of December as she is retiring and may not have insurance until she turns 46. Also she would like to have a whooping cough vaccine as there is a new baby in the family. cb number is [phone] Thanks

## 2016-05-02 NOTE — Telephone Encounter (Signed)
Please put her in wherever you can for 30 min appt -thanks  If she wants the Tdap before that -nurse visit is fine  thanks

## 2016-05-03 NOTE — Telephone Encounter (Signed)
Scheduled for 09/18  Pt aware

## 2016-05-14 ENCOUNTER — Encounter: Payer: Self-pay | Admitting: Family Medicine

## 2016-05-14 ENCOUNTER — Ambulatory Visit (INDEPENDENT_AMBULATORY_CARE_PROVIDER_SITE_OTHER): Payer: 59 | Admitting: Family Medicine

## 2016-05-14 VITALS — BP 128/82 | HR 65 | Temp 98.6°F | Ht 64.5 in | Wt 168.5 lb

## 2016-05-14 DIAGNOSIS — E785 Hyperlipidemia, unspecified: Secondary | ICD-10-CM | POA: Diagnosis not present

## 2016-05-14 DIAGNOSIS — Z1159 Encounter for screening for other viral diseases: Secondary | ICD-10-CM

## 2016-05-14 DIAGNOSIS — Z23 Encounter for immunization: Secondary | ICD-10-CM | POA: Diagnosis not present

## 2016-05-14 DIAGNOSIS — Z114 Encounter for screening for human immunodeficiency virus [HIV]: Secondary | ICD-10-CM

## 2016-05-14 DIAGNOSIS — Z0000 Encounter for general adult medical examination without abnormal findings: Secondary | ICD-10-CM | POA: Diagnosis not present

## 2016-05-14 DIAGNOSIS — R6884 Jaw pain: Secondary | ICD-10-CM | POA: Insufficient documentation

## 2016-05-14 LAB — CBC WITH DIFFERENTIAL/PLATELET
BASOS ABS: 0 10*3/uL (ref 0.0–0.1)
Basophils Relative: 0.4 % (ref 0.0–3.0)
Eosinophils Absolute: 0.1 10*3/uL (ref 0.0–0.7)
Eosinophils Relative: 1.3 % (ref 0.0–5.0)
HEMATOCRIT: 40.8 % (ref 36.0–46.0)
Hemoglobin: 13.9 g/dL (ref 12.0–15.0)
LYMPHS PCT: 25.5 % (ref 12.0–46.0)
Lymphs Abs: 2 10*3/uL (ref 0.7–4.0)
MCHC: 34 g/dL (ref 30.0–36.0)
MCV: 93.2 fl (ref 78.0–100.0)
MONOS PCT: 7.2 % (ref 3.0–12.0)
Monocytes Absolute: 0.6 10*3/uL (ref 0.1–1.0)
NEUTROS ABS: 5.1 10*3/uL (ref 1.4–7.7)
Neutrophils Relative %: 65.6 % (ref 43.0–77.0)
PLATELETS: 165 10*3/uL (ref 150.0–400.0)
RBC: 4.38 Mil/uL (ref 3.87–5.11)
RDW: 13 % (ref 11.5–15.5)
WBC: 7.8 10*3/uL (ref 4.0–10.5)

## 2016-05-14 LAB — LIPID PANEL
CHOLESTEROL: 210 mg/dL — AB (ref 0–200)
HDL: 50.5 mg/dL (ref >39.00)
NonHDL: 159.13
TRIGLYCERIDES: 240 mg/dL — AB (ref 0.0–149.0)
Total CHOL/HDL Ratio: 4
VLDL: 48 mg/dL — ABNORMAL HIGH (ref 0.0–40.0)

## 2016-05-14 LAB — TSH: TSH: 2.57 u[IU]/mL (ref 0.35–4.50)

## 2016-05-14 LAB — COMPREHENSIVE METABOLIC PANEL
ALK PHOS: 113 U/L (ref 39–117)
ALT: 18 U/L (ref 0–35)
AST: 21 U/L (ref 0–37)
Albumin: 4.1 g/dL (ref 3.5–5.2)
BILIRUBIN TOTAL: 0.4 mg/dL (ref 0.2–1.2)
BUN: 17 mg/dL (ref 6–23)
CALCIUM: 10.2 mg/dL (ref 8.4–10.5)
CO2: 30 mEq/L (ref 19–32)
Chloride: 106 mEq/L (ref 96–112)
Creatinine, Ser: 0.75 mg/dL (ref 0.40–1.20)
GFR: 82.66 mL/min (ref >60.00)
Glucose, Bld: 88 mg/dL (ref 70–99)
Potassium: 4.5 mEq/L (ref 3.5–5.1)
Sodium: 139 mEq/L (ref 135–145)
TOTAL PROTEIN: 6.5 g/dL (ref 6.0–8.3)

## 2016-05-14 LAB — HEPATITIS C ANTIBODY: HCV Ab: NEGATIVE

## 2016-05-14 LAB — LDL CHOLESTEROL, DIRECT: Direct LDL: 135 mg/dL

## 2016-05-14 MED ORDER — TETANUS-DIPHTH-ACELL PERTUSSIS 5-2.5-18.5 LF-MCG/0.5 IM SUSP
0.5000 mL | Freq: Once | INTRAMUSCULAR | Status: AC
  Administered 2016-05-14: 0.5 mL via INTRAMUSCULAR

## 2016-05-14 NOTE — Progress Notes (Signed)
Pre visit review using our clinic review tool, if applicable. No additional management support is needed unless otherwise documented below in the visit note. 

## 2016-05-14 NOTE — Progress Notes (Signed)
Subjective:    Patient ID: Alisha Long, female    DOB: 09/13/1950, 65 y.o.   MRN: [id number]  HPI Here for health maintenance exam and to review chronic medical problems    Has been feeling good overall   Needs labs today   Is interested in HIV and Hep C screening   Still tired at times  One bad week - unsure why   She has had some spells where her L arm and neck and jaw felt funny - achey funny feeling Not exertional  Usually it is when she sits down resting late at night  Once it was a really bad case of indigestion  EKG today NSR with rate of 60    Takes aleve for joint pain / knee arthritis   Is interested in doppler of her legs to screen for peripheral vasc dz if needed   Wt Readings from Last 3 Encounters:  05/14/16 168 lb 8 oz (76.4 kg)  12/06/15 168 lb (76.2 kg)  09/28/15 168 lb 8 oz (76.4 kg)  this is stable  bmi is 28.4  Flu shot -will get today   Needs a Tdap due to having a new baby in the family - due date is oct 6th  Td 2015  Mammogram 11/16- will be due in Nov  Nl self breast exam   Colonoscopy 11/12- 5 years due to adenoma  Pt states she has this set up for Nov   Pap 9/16 Has had a hysterectomy due to bleeding / partial  No gyn problems   Zoster vaccine 9/13  dexa 11/16 Good -in normal range  She takes the vit D No calcium- eat high calcium foods  No falls or broken bones   Hx of hyperlipidemia Lab Results  Component Value Date   CHOL 237 (H) 05/04/2015   CHOL 257 (H) 09/04/2013   CHOL 247 (H) 04/08/2012   Lab Results  Component Value Date   HDL 53.10 05/04/2015   HDL 51.90 09/04/2013   HDL 56.60 04/08/2012   Lab Results  Component Value Date   LDLCALC 128 (H) 08/17/2008   Lab Results  Component Value Date   TRIG 230.0 (H) 05/04/2015   TRIG 191.0 (H) 09/04/2013   TRIG 232.0 (H) 04/08/2012   Lab Results  Component Value Date   CHOLHDL 4 05/04/2015   CHOLHDL 5 09/04/2013   CHOLHDL 4 04/08/2012   Lab Results    Component Value Date   LDLDIRECT 162.0 05/04/2015   LDLDIRECT 166.4 09/04/2013   LDLDIRECT 159.9 04/08/2012   intol of all statins Declines other medicines  Tried red yeast rice and it made her muscles hurt too    Review of Systems Review of Systems  Constitutional: Negative for fever, appetite change, fatigue and unexpected weight change.  Eyes: Negative for pain and visual disturbance.  Respiratory: Negative for cough and shortness of breath.   Cardiovascular: Negative for cp or palpitations    Gastrointestinal: Negative for nausea, diarrhea and constipation.  Genitourinary: Negative for urgency and frequency.  Skin: Negative for pallor or rash  pos for toenail fungus L great toe-has taken lamasil/ is painful  Neurological: Negative for weakness, light-headedness, numbness and headaches.  Hematological: Negative for adenopathy. Does not bruise/bleed easily.  Psychiatric/Behavioral: Negative for dysphoric mood. The patient is not nervous/anxious.         Objective:   Physical Exam  Constitutional: She appears well-developed and well-nourished. No distress.  Well appearing   HENT:  Head:  Normocephalic and atraumatic.  Right Ear: External ear normal.  Left Ear: External ear normal.  Mouth/Throat: Oropharynx is clear and moist.  Eyes: Conjunctivae and EOM are normal. Pupils are equal, round, and reactive to light. No scleral icterus.  Neck: Normal range of motion. Neck supple. No JVD present. Carotid bruit is not present. No thyromegaly present.  Cardiovascular: Normal rate, regular rhythm, normal heart sounds and intact distal pulses.  Exam reveals no gallop.   Pulmonary/Chest: Effort normal and breath sounds normal. No respiratory distress. She has no wheezes. She has no rales. She exhibits no tenderness.  Abdominal: Soft. Bowel sounds are normal. She exhibits no distension, no abdominal bruit and no mass. There is no tenderness.  Genitourinary: No breast swelling, tenderness,  discharge or bleeding.  Genitourinary Comments: Breast exam: No mass, nodules, thickening, tenderness, bulging, retraction, inflamation, nipple discharge or skin changes noted.  No axillary or clavicular LA.      Musculoskeletal: Normal range of motion. She exhibits no edema or tenderness.  Lymphadenopathy:    She has no cervical adenopathy.  Neurological: She is alert. She has normal reflexes. No cranial nerve deficit. She exhibits normal muscle tone. Coordination normal.  Skin: Skin is warm and dry. No rash noted. No erythema. No pallor.  Some distal white discoloration of R great toenail with mild thickening  No tenderness  Psychiatric: She has a normal mood and affect.          Assessment & Plan:   Problem List Items Addressed This Visit      Other   Routine general medical examination at a health care facility    Reviewed health habits including diet and exercise and skin cancer prevention Reviewed appropriate screening tests for age  Also reviewed health mt list, fam hx and immunization status , as well as social and family history   See HPI Labs ordered  You are due for a mammogram in November - don't forget to schedule it at the breast center  Get your colonoscopy in Nov  Tdap and flu vaccines today  Labs today  EKG today -looks good  If you want to see a podiatrist about your toenail -let me know  For cholesterol    Avoid red meat/ fried foods/ egg yolks/ fatty breakfast meats/ butter, cheese and high fat dairy/ and shellfish        Relevant Orders   EKG 12-Lead (Completed)   CBC with Differential/Platelet (Completed)   Comprehensive metabolic panel (Completed)   Lipid panel (Completed)   TSH (Completed)   Need for hepatitis C screening test    Lab sent       Relevant Orders   Hepatitis C antibody   Hyperlipidemia - Primary    Disc goals for lipids and reasons to control them Rev labs with pt Rev low sat fat diet in detail  Not in control  Pt is intol of  all statins and red yeast rice  Declines other medications ? If would be a candidate for PCYK9 inhibitor in the future       Encounter for screening for HIV    Lab sent       Relevant Orders   HIV antibody (with reflex)    Other Visit Diagnoses    Need for influenza vaccination       Relevant Orders   Flu Vaccine QUAD 36+ mos IM (Completed)   Need for Tdap vaccination       Relevant Medications   Tdap (BOOSTRIX) injection  0.5 mL (Completed)

## 2016-05-14 NOTE — Assessment & Plan Note (Signed)
Lab sent 

## 2016-05-14 NOTE — Assessment & Plan Note (Addendum)
Reviewed health habits including diet and exercise and skin cancer prevention Reviewed appropriate screening tests for age  Also reviewed health mt list, fam hx and immunization status , as well as social and family history   See HPI Labs ordered  You are due for a mammogram in November - don't forget to schedule it at the breast center  Get your colonoscopy in Nov  Tdap and flu vaccines today  Labs today  EKG today -looks good  If you want to see a podiatrist about your toenail -let me know  For cholesterol    Avoid red meat/ fried foods/ egg yolks/ fatty breakfast meats/ butter, cheese and high fat dairy/ and shellfish

## 2016-05-14 NOTE — Assessment & Plan Note (Signed)
Disc goals for lipids and reasons to control them Rev labs with pt Rev low sat fat diet in detail  Not in control  Pt is intol of all statins and red yeast rice  Declines other medications ? If would be a candidate for PCYK9 inhibitor in the future

## 2016-05-14 NOTE — Patient Instructions (Addendum)
You are due for a mammogram in November - don't forget to schedule it at the breast center  Get your colonoscopy in Nov  Tdap and flu vaccines today  Labs today  EKG today -looks good  If you want to see a podiatrist about your toenail -let me know  For cholesterol    Avoid red meat/ fried foods/ egg yolks/ fatty breakfast meats/ butter, cheese and high fat dairy/ and shellfish

## 2016-05-15 ENCOUNTER — Encounter: Payer: Self-pay | Admitting: Gastroenterology

## 2016-05-15 LAB — HIV ANTIBODY (ROUTINE TESTING W REFLEX): HIV 1&2 Ab, 4th Generation: NONREACTIVE

## 2016-05-16 ENCOUNTER — Telehealth: Payer: Self-pay | Admitting: Family Medicine

## 2016-05-16 DIAGNOSIS — R6884 Jaw pain: Secondary | ICD-10-CM

## 2016-05-16 NOTE — Telephone Encounter (Signed)
Referral done for cardiology for L jaw/neck and arm discomfort  Will route to [facility]

## 2016-05-30 ENCOUNTER — Other Ambulatory Visit: Payer: Self-pay | Admitting: Family Medicine

## 2016-05-30 DIAGNOSIS — Z1231 Encounter for screening mammogram for malignant neoplasm of breast: Secondary | ICD-10-CM

## 2016-06-05 NOTE — Progress Notes (Signed)
Cardiology Office Note   Date:  06/06/2016   ID:  Alisha Long, DOB 22-Aug-1951, MRN [id number]  PCP:  Loura Pardon, MD  Cardiologist:   Skeet Latch, MD   Chief Complaint  Patient presents with  . Follow-up    History of Present Illness: Alisha Long is a 65 y.o. female with hyperlipidemia who presents for management of hyperlipidemia.  Alisha Long saw Dr. Glori Bickers on 05/14/16 and was noted to have poorly-controlled cholesterol.  She has tried both atorvastatin and rosuvastatin and reports myalgias.  She also did not tolerate red yeast rice.  She was referred to cardiology for consideration of a PCSK9 inhibitor.  She has felt generally well and exercises twice per week at the gym. She does both cardio and weight lifting without symptoms. She has not noted any lower extremity edema, orthopnea, or PND. She has noted a couple episodes of left arm numbness that radiates to her neck. It typically occurs in the setting of indigestion. She has no exertional chest discomfort. This the symptoms have been ongoing for the last 7 years and occur very rarely. She also notes some pounding of her heart when she lays on her left side. There is no associated lightheadedness or dizziness. It does not seem to be beating irregularly. It improves when she rolls onto her right side and she denies any daytime symptoms.  She reports wearing a heart monitor several years ago that was reportedly unremarkable.   Past Medical History:  Diagnosis Date  . Colon polyps   . History of shingles   . Hyperlipidemia   . Insomnia   . Palpitations   . Post-nasal drip    with chronic cough    Past Surgical History:  Procedure Laterality Date  . ABDOMINAL HYSTERECTOMY     bleeding, ovaries intact  . CARPAL TUNNEL RELEASE  11/2004  . CESAREAN SECTION     x 3  . CHOLECYSTECTOMY  12/2005  . COLONOSCOPY     polyps  . ESOPHAGOGASTRODUODENOSCOPY     nml  . gallstones     Abd [place]   . KNEE SURGERY     10/2000  . KNEE SURGERY  01/2001   replaced ACL     Current Outpatient Prescriptions  Medication Sig Dispense Refill  . cholecalciferol (VITAMIN D) 1000 UNITS tablet Take 1,000 Units by mouth daily.      . Multiple Vitamins-Minerals (MULTIVITAMIN ADULTS 50+ PO) Take 1 capsule by mouth daily.    . naproxen sodium (ANAPROX) 220 MG tablet Take 440 mg by mouth 2 (two) times daily as needed.    . valACYclovir (VALTREX) 1000 MG tablet TAKE TWO (2) TABLETS BY MOUTH 2 TIMES DAILY AS NEEDED FOR COLD SORES (FOR ONE DAY) 12 tablet 4   No current facility-administered medications for this visit.     Allergies:   Penicillins; Amoxicillin; Atorvastatin; Bactrim [sulfamethoxazole-trimethoprim]; Cephalexin; Mometasone furoate; and Rosuvastatin    Social History:  The patient  reports that she has quit smoking. She has never used smokeless tobacco. She reports that she does not drink alcohol or use drugs.   Family History:  The patient's family history includes Aneurysm in her maternal grandmother; Hyperlipidemia in her maternal grandmother and mother; Hypertension in her maternal grandmother; Seizures in her father.    ROS:  Please see the history of present illness.   Otherwise, review of systems are positive for none.   All other systems are reviewed and negative.    PHYSICAL EXAM:  VS:  BP 134/78   Pulse 65   Ht 5\' 4"  (1.626 m)   Wt 168 lb 6.4 oz (76.4 kg)   SpO2 97%   BMI 28.91 kg/m  , BMI Body mass index is 28.91 kg/m. GENERAL:  Well appearing HEENT:  Pupils equal round and reactive, fundi not visualized, oral mucosa unremarkable NECK:  No jugular venous distention, waveform within normal limits, carotid upstroke brisk and symmetric, no bruits, no thyromegaly LYMPHATICS:  No cervical adenopathy LUNGS:  Clear to auscultation bilaterally HEART:  RRR.  PMI not displaced or sustained,S1 and S2 within normal limits, no S3, no S4, no clicks, no rubs, no  murmurs ABD:  Flat, positive bowel  sounds normal in frequency in pitch, no bruits, no rebound, no guarding, no midline pulsatile mass, no hepatomegaly, no splenomegaly EXT:  2 plus pulses throughout, no edema, no cyanosis no clubbing SKIN:  No rashes no nodules NEURO:  Cranial nerves II through XII grossly intact, motor grossly intact throughout PSYCH:  Cognitively intact, oriented to person place and time   EKG:  EKG is not ordered today. 05/14/16: Sinus rhythm rate 60 bpm.    Recent Labs: 05/14/2016: ALT 18; BUN 17; Creatinine, Ser 0.75; Hemoglobin 13.9; Platelets 165.0; Potassium 4.5; Sodium 139; TSH 2.57    Lipid Panel    Component Value Date/Time   CHOL 210 (H) 05/14/2016 1248   TRIG 240.0 (H) 05/14/2016 1248   HDL 50.50 05/14/2016 1248   CHOLHDL 4 05/14/2016 1248   VLDL 48.0 (H) 05/14/2016 1248   LDLCALC 128 (H) 08/17/2008 2129   LDLDIRECT 135.0 05/14/2016 1248      Wt Readings from Last 3 Encounters:  06/06/16 168 lb 6.4 oz (76.4 kg)  05/14/16 168 lb 8 oz (76.4 kg)  12/06/15 168 lb (76.2 kg)      ASSESSMENT AND PLAN:  # Hyperlipidemia:  Alisha Long's lipids are elevated.  Her ASCVD 10 year risk is 5.9%.  She has not tolerated statins in the past.  We discussed the options of trying pravastatin present.. She is not interested in doing either at this time and plans to continue managing her lipids with diet. We also discussed increasing her exercise to 3 or 4 days per week. She is breast understanding.  Unfortunately she is not a candidate for a PCSK9 inhibitor given that she does not have any demonstrable cardiovascular disease, family history of premature CAD, or familial hyperlipidemia.   Current medicines are reviewed at length with the patient today.  The patient does not have concerns regarding medicines.  The following changes have been made:  no change  Labs/ tests ordered today include:  No orders of the defined types were placed in this encounter.    Disposition:   FU with Jerre Vandrunen C.  Oval Linsey, MD, [facility] as needed.    This note was written with the assistance of speech recognition software.  Please excuse any transcriptional errors.  Signed, Zacharey Jensen C. Oval Linsey, MD, [facility]  06/06/2016 9:08 PM    [place]

## 2016-06-06 ENCOUNTER — Encounter: Payer: Self-pay | Admitting: Cardiovascular Disease

## 2016-06-06 ENCOUNTER — Ambulatory Visit (INDEPENDENT_AMBULATORY_CARE_PROVIDER_SITE_OTHER): Payer: 59 | Admitting: Cardiovascular Disease

## 2016-06-06 VITALS — BP 134/78 | HR 65 | Ht 64.0 in | Wt 168.4 lb

## 2016-06-06 DIAGNOSIS — E7800 Pure hypercholesterolemia, unspecified: Secondary | ICD-10-CM | POA: Diagnosis not present

## 2016-06-06 NOTE — Patient Instructions (Signed)
Medication Instructions:  .Your physician recommends that you continue on your current medications as directed. Please refer to the Current Medication list given to you today.  Labwork: none  Testing/Procedures: none  Follow-Up: As needed   

## 2016-07-02 ENCOUNTER — Ambulatory Visit: Payer: 59

## 2016-07-09 ENCOUNTER — Ambulatory Visit
Admission: RE | Admit: 2016-07-09 | Discharge: 2016-07-09 | Disposition: A | Discharge status: Home / Self Care | Payer: 59 | Source: Ambulatory Visit | Attending: Family Medicine | Admitting: Family Medicine

## 2016-07-09 DIAGNOSIS — Z1231 Encounter for screening mammogram for malignant neoplasm of breast: Secondary | ICD-10-CM

## 2016-07-17 ENCOUNTER — Ambulatory Visit (AMBULATORY_SURGERY_CENTER): Payer: Self-pay

## 2016-07-17 ENCOUNTER — Encounter: Payer: Self-pay | Admitting: Gastroenterology

## 2016-07-17 VITALS — Ht 65.0 in | Wt 169.0 lb

## 2016-07-17 DIAGNOSIS — Z860100 Personal history of colon polyps, unspecified: Secondary | ICD-10-CM

## 2016-07-17 MED ORDER — SUPREP BOWEL PREP KIT 17.5-3.13-1.6 GM/177ML PO SOLN: 1.0000 | Freq: Once | ORAL | 0 refills | Status: AC

## 2016-07-17 NOTE — Progress Notes (Signed)
No allergies to eggs or soy No past problems with anesthesia No diet meds No home oxygen  Declined emmi 

## 2016-07-31 ENCOUNTER — Ambulatory Visit (AMBULATORY_SURGERY_CENTER): Payer: 59 | Admitting: Gastroenterology

## 2016-07-31 ENCOUNTER — Encounter: Payer: Self-pay | Admitting: Gastroenterology

## 2016-07-31 VITALS — BP 127/57 | HR 61 | Temp 98.0°F | Resp 16 | Ht 65.0 in | Wt 169.0 lb

## 2016-07-31 DIAGNOSIS — D122 Benign neoplasm of ascending colon: Secondary | ICD-10-CM | POA: Diagnosis not present

## 2016-07-31 DIAGNOSIS — D125 Benign neoplasm of sigmoid colon: Secondary | ICD-10-CM | POA: Diagnosis not present

## 2016-07-31 DIAGNOSIS — Z86010 Personal history of colonic polyps: Secondary | ICD-10-CM

## 2016-07-31 MED ORDER — SODIUM CHLORIDE 0.9 % IV SOLN: 500.0000 mL | INTRAVENOUS | Status: DC

## 2016-07-31 NOTE — Progress Notes (Signed)
Several attempts made to get pt's blood pressure in left arm.  C/O being "too tight" so cuff readjusted several times.  I changed monitors to get an accurate blood pressure.  She c/o of left arm "feeling numb" after BP cuff removed.  Fingers warm with good capillary refill noted and good radial pulse noted.  No further c/o at discharge

## 2016-07-31 NOTE — Patient Instructions (Signed)
YOU HAD AN ENDOSCOPIC PROCEDURE TODAY AT THE [facility]:   Refer to the procedure report that was given to you for any specific questions about what was found during the examination.  If the procedure report does not answer your questions, please call your gastroenterologist to clarify.  If you requested that your care partner not be given the details of your procedure findings, then the procedure report has been included in a sealed envelope for you to review at your convenience later.  YOU SHOULD EXPECT: Some feelings of bloating in the abdomen. Passage of more gas than usual.  Walking can help get rid of the air that was put into your GI tract during the procedure and reduce the bloating. If you had a lower endoscopy (such as a colonoscopy or flexible sigmoidoscopy) you may notice spotting of blood in your stool or on the toilet paper. If you underwent a bowel prep for your procedure, you may not have a normal bowel movement for a few days.  Please Note:  You might notice some irritation and congestion in your nose or some drainage.  This is from the oxygen used during your procedure.  There is no need for concern and it should clear up in a day or so.  SYMPTOMS TO REPORT IMMEDIATELY:   Following lower endoscopy (colonoscopy or flexible sigmoidoscopy):  Excessive amounts of blood in the stool  Significant tenderness or worsening of abdominal pains  Swelling of the abdomen that is new, acute  Fever of 100F or higher  For urgent or emergent issues, a gastroenterologist can be reached at any hour by calling [phone].  DIET:  We do recommend a small meal at first, but then you may proceed to your regular diet.  Drink plenty of fluids but you should avoid alcoholic beverages for 24 hours.  ACTIVITY:  You should plan to take it easy for the rest of today and you should NOT DRIVE or use heavy machinery until tomorrow (because of the sedation medicines used during the test).     FOLLOW UP: Our staff will call the number listed on your records the next business day following your procedure to check on you and address any questions or concerns that you may have regarding the information given to you following your procedure. If we do not reach you, we will leave a message.  However, if you are feeling well and you are not experiencing any problems, there is no need to return our call.  We will assume that you have returned to your regular daily activities without incident.  If any biopsies were taken you will be contacted by phone or by letter within the next 1-3 weeks.  Please call us at [phone] if you have not heard about the biopsies in 3 weeks.   SIGNATURES/CONFIDENTIALITY: You and/or your care partner have signed paperwork which will be entered into your electronic medical record.  These signatures attest to the fact that that the information above on your After Visit Summary has been reviewed and is understood.  Full responsibility of the confidentiality of this discharge information lies with you and/or your care-partner.  Please read over handout about polyps  Continue your normal medications  Await pathology 

## 2016-07-31 NOTE — Progress Notes (Signed)
Called to room to assist during endoscopic procedure.  Patient ID and intended procedure confirmed with present staff. Received instructions for my participation in the procedure from the performing physician.  

## 2016-07-31 NOTE — Op Note (Signed)
[facility] Patient Name: Alisha Long Procedure Date: 07/31/2016 7:39 AM MRN: [id number] Endoscopist: Milus Banister , MD Age: 65 Referring MD:  Date of Birth: [birth year] Gender: Female Account #: [id number] Procedure:                Colonoscopy Indications:              High risk colon cancer surveillance: Personal                            history of colonic polyps (subCM TA in 2012 and                            also in 2007) Medicines:                Monitored Anesthesia Care Procedure:                Pre-Anesthesia Assessment:                           - Prior to the procedure, a History and Physical                            was performed, and patient medications and                            allergies were reviewed. The patient's tolerance of                            previous anesthesia was also reviewed. The risks                            and benefits of the procedure and the sedation                            options and risks were discussed with the patient.                            All questions were answered, and informed consent                            was obtained. Prior Anticoagulants: The patient has                            taken no previous anticoagulant or antiplatelet                            agents. ASA Grade Assessment: II - A patient with                            mild systemic disease. After reviewing the risks                            and benefits, the patient was deemed in  satisfactory condition to undergo the procedure.                           After obtaining informed consent, the colonoscope                            was passed under direct vision. Throughout the                            procedure, the patient's blood pressure, pulse, and                            oxygen saturations were monitored continuously. The                            Model CF-HQ190L [id number] scope was introduced                             through the anus and advanced to the the cecum,                            identified by appendiceal orifice and ileocecal                            valve. The colonoscopy was performed without                            difficulty. The patient tolerated the procedure                            well. The quality of the bowel preparation was                            excellent. The ileocecal valve, appendiceal                            orifice, and rectum were photographed. Scope In: 7:44:15 AM Scope Out: 7:55:37 AM Scope Withdrawal Time: 0 hours 8 minutes 31 seconds  Total Procedure Duration: 0 hours 11 minutes 22 seconds  Findings:                 Two sessile polyps were found in the sigmoid colon                            and ascending colon. The polyps were 2 to 3 mm in                            size. These polyps were removed with a cold snare.                            Resection and retrieval were complete.                           The exam was otherwise without abnormality on  direct and retroflexion views. Complications:            No immediate complications. Estimated blood loss:                            None. Estimated Blood Loss:     Estimated blood loss: none. Impression:               - Two 2 to 3 mm polyps in the sigmoid colon and in                            the ascending colon, removed with a cold snare.                            Resected and retrieved.                           - The examination was otherwise normal on direct                            and retroflexion views. Recommendation:           - Patient has a contact number available for                            emergencies. The signs and symptoms of potential                            delayed complications were discussed with the                            patient. Return to normal activities tomorrow.                            Written discharge  instructions were provided to the                            patient.                           - Resume previous diet.                           - Continue present medications.                           You will receive a letter within 2-3 weeks with the                            pathology results and my final recommendations.                           If the polyp(s) is proven to be 'pre-cancerous' on                            pathology, you will need repeat colonoscopy in 5  years. If the polyp(s) is NOT 'precancerous' on                            pathology then you should repeat colon cancer                            screening in 10 years with colonoscopy without need                            for colon cancer screening by any method prior to                            then (including stool testing). Milus Banister, MD 07/31/2016 7:58:51 AM This report has been signed electronically.

## 2016-07-31 NOTE — Progress Notes (Signed)
Report to PACU, RN, vss, BBS= Clear.  

## 2016-07-31 NOTE — Addendum Note (Signed)
Addended by: Evonnie Pat A on: 07/31/2016 11:38 AM   Modules accepted: Orders

## 2016-08-01 ENCOUNTER — Telehealth: Payer: Self-pay

## 2016-08-01 NOTE — Telephone Encounter (Signed)
  Follow up Call-  Call back number 07/31/2016  Post procedure Call Back phone  # [phone]  Permission to leave phone message Yes  Some recent data might be hidden     Patient questions:  Do you have a fever, pain , or abdominal swelling? No. Pain Score  0 *  Have you tolerated food without any problems? Yes.    Have you been able to return to your normal activities? Yes.    Do you have any questions about your discharge instructions: Diet   No. Medications  No. Follow up visit  No.  Do you have questions or concerns about your Care? No.  Actions: * If pain score is 4 or above: No action needed, pain <4.

## 2016-08-06 ENCOUNTER — Encounter: Payer: Self-pay | Admitting: Gastroenterology

## 2016-12-14 ENCOUNTER — Ambulatory Visit (INDEPENDENT_AMBULATORY_CARE_PROVIDER_SITE_OTHER): Payer: 59 | Admitting: Family Medicine

## 2016-12-14 ENCOUNTER — Encounter: Payer: Self-pay | Admitting: Family Medicine

## 2016-12-14 VITALS — BP 146/74 | HR 78 | Temp 98.7°F | Ht 64.0 in | Wt 170.5 lb

## 2016-12-14 DIAGNOSIS — L918 Other hypertrophic disorders of the skin: Secondary | ICD-10-CM

## 2016-12-14 DIAGNOSIS — L700 Acne vulgaris: Secondary | ICD-10-CM | POA: Diagnosis not present

## 2016-12-14 DIAGNOSIS — L821 Other seborrheic keratosis: Secondary | ICD-10-CM | POA: Diagnosis not present

## 2016-12-14 NOTE — Patient Instructions (Signed)
Keep all areas clean dry with soap and water  Band aid and antibacterial ointment as needed  If any signs of infection - pain/ redness /drainage -please let us know  Take care

## 2016-12-14 NOTE — Progress Notes (Signed)
Subjective:    Patient ID: Alisha Long, female    DOB: May 14, 1951, 66 y.o.   MRN: [id number]  HPI Here for removal of skin tags  One on anterior neck - tag/irritated R hand - dry Back of R leg -dry One ext perineal area - small bump she cannot see  Patient Active Problem List   Diagnosis Date Noted  . Skin tag 12/14/2016  . 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 Comedone 12/14/2016  . Encounter for screening for HIV 05/14/2016  . Need for hepatitis C screening test 05/14/2016  . Jaw pain 05/14/2016  . Estrogen deficiency 05/24/2015  . Actinic keratosis 04/17/2013  . Anal skin tag 10/27/2012  . Encounter for routine gynecological examination 04/15/2012  . Routine general medical examination at a health care facility 04/07/2012  . HEADACHE 11/01/2010  . COLONIC POLYPS 01/06/2010  . POSTNASAL DRIP 01/06/2010  . IRON DEFICIENCY ANEMIA, HX OF 11/07/2009  . OTHER SEBORRHEIC KERATOSIS 07/19/2009  . SYMPTOM, INSOMNIA NOS 06/18/2007  . Hyperlipidemia 06/11/2007   Past Medical History:  Diagnosis Date  . Colon polyps   . History of shingles   . Hyperlipidemia   . Hypertension   . Insomnia   . Palpitations   . Post-nasal drip    with chronic cough   Past Surgical History:  Procedure Laterality Date  . ABDOMINAL HYSTERECTOMY     bleeding, ovaries intact  . CARPAL TUNNEL RELEASE  11/2004  . CESAREAN SECTION     x 3  . CHOLECYSTECTOMY  12/2005  . COLONOSCOPY     polyps  . ESOPHAGOGASTRODUODENOSCOPY     nml  . gallstones     Abd [place]   . KNEE SURGERY     10/2000  . KNEE SURGERY  01/2001   replaced ACL   Social History  Substance Use Topics  . Smoking status: Former Research scientist (life sciences)  . Smokeless tobacco: Never Used  . Alcohol use 0.0 oz/week     Comment: wine every once in a while   Family History  Problem Relation Age of Onset  . Seizures Father   . Hyperlipidemia Mother   . Aneurysm Maternal Grandmother   . Hyperlipidemia Maternal Grandmother   . Hypertension  Maternal Grandmother   . Colon cancer Neg Hx    Allergies  Allergen Reactions  . Penicillins Shortness Of Breath    REACTION: hives  . Amoxicillin     REACTION: hives  . Atorvastatin     REACTION: joint pain  . Bactrim [Sulfamethoxazole-Trimethoprim] Hives  . Cephalexin     REACTION: hives  . Mometasone Furoate     REACTION: headache  . Rosuvastatin     REACTION: headache   Current Outpatient Prescriptions on File Prior to Visit  Medication Sig Dispense Refill  . cholecalciferol (VITAMIN D) 1000 UNITS tablet Take 1,000 Units by mouth daily.      . Multiple Vitamins-Minerals (MULTIVITAMIN ADULTS 50+ PO) Take 1 capsule by mouth daily.    . naproxen sodium (ANAPROX) 220 MG tablet Take 440 mg by mouth 2 (two) times daily as needed.    . valACYclovir (VALTREX) 1000 MG tablet TAKE TWO (2) TABLETS BY MOUTH 2 TIMES DAILY AS NEEDED FOR COLD SORES (FOR ONE DAY) 12 tablet 4   Current Facility-Administered Medications on File Prior to Visit  Medication Dose Route Frequency Provider Last Rate Last Dose  . 0.9 %  sodium chloride infusion  500 mL Intravenous Continuous Milus Banister, MD  Review of Systems Review of Systems  Constitutional: Negative for fever, appetite change, fatigue and unexpected weight change.  Eyes: Negative for pain and visual disturbance.  Respiratory: Negative for cough and shortness of breath.   Cardiovascular: Negative for cp or palpitations    Gastrointestinal: Negative for nausea, diarrhea and constipation.  Genitourinary: Negative for urgency and frequency.  Skin: Negative for pallor or rash  pos for skin tags and moles Neurological: Negative for weakness, light-headedness, numbness and headaches.  Hematological: Negative for adenopathy. Does not bruise/bleed easily.  Psychiatric/Behavioral: Negative for dysphoric mood. The patient is not nervous/anxious.         Objective:   Physical Exam  Constitutional: She appears well-developed and  well-nourished. No distress.  Well appearing   Eyes: Conjunctivae and EOM are normal. Pupils are equal, round, and reactive to light.  Neck: Normal range of motion. Neck supple.  Genitourinary:  Genitourinary Comments: 1-2 mm comedone on L labia majora Expressed scant amt of white material  Cleaned well  Pt tolerated procedure well   Musculoskeletal:  No joint changes   Lymphadenopathy:    She has no cervical adenopathy.  Neurological: She is alert.  Skin: Skin is warm and dry. No rash noted. No erythema. No pallor.  No rash Lentigines and solar aging noted  2 mm irritated skin tag R anterior neck  anest with freeze spray  Cleaned and removed with scissors  Hemostasis with silver nit and pressure Dressed with band aid and abx oint  2-3 mm SK on R hand- irritated/tan  Cryo tx freeze and thaw times 3  3 mm sk on dorsal R lower leg  Cryo tx freeze and thaw times 3  Pt tolerated well     Psychiatric: She has a normal mood and affect.          Assessment & Plan:   Problem List Items Addressed This Visit      Musculoskeletal and Integument   Comedone    L labial majora Expressed white core  Pt tolerated well  Disc wound care- keep clean with soap and water Update if any problems       Seborrheic keratoses    2-3 mm on back of hand -irritated  3 mm on back of R lower leg-irritated  Both treated with liquid nitrogen freeze and thaw times 3  Disc expectations for healing  Disc wound care  Update if any problems       Skin tag    R anterior neck 2 mm and irritated from clothing but not infected  Removed by scissors , pt tolerated well  Disc wound care  Update if any problems

## 2016-12-14 NOTE — Progress Notes (Signed)
Pre visit review using our clinic review tool, if applicable. No additional management support is needed unless otherwise documented below in the visit note. 

## 2016-12-14 NOTE — Assessment & Plan Note (Signed)
R anterior neck 2 mm and irritated from clothing but not infected  Removed by scissors , pt tolerated well  Disc wound care  Update if any problems

## 2016-12-14 NOTE — Assessment & Plan Note (Signed)
2-3 mm on back of hand -irritated  3 mm on back of R lower leg-irritated  Both treated with liquid nitrogen freeze and thaw times 3  Disc expectations for healing  Disc wound care  Update if any problems

## 2016-12-16 NOTE — Assessment & Plan Note (Signed)
L labial majora Expressed white core  Pt tolerated well  Disc wound care- keep clean with soap and water Update if any problems

## 2017-05-08 ENCOUNTER — Other Ambulatory Visit: Payer: Self-pay | Admitting: Family Medicine

## 2017-05-08 DIAGNOSIS — Z1231 Encounter for screening mammogram for malignant neoplasm of breast: Secondary | ICD-10-CM

## 2017-05-10 ENCOUNTER — Telehealth: Payer: Self-pay | Admitting: Family Medicine

## 2017-05-10 DIAGNOSIS — E7800 Pure hypercholesterolemia, unspecified: Secondary | ICD-10-CM

## 2017-05-10 DIAGNOSIS — Z0000 Encounter for general adult medical examination without abnormal findings: Secondary | ICD-10-CM

## 2017-05-10 NOTE — Telephone Encounter (Signed)
-----   Message from Ellamae Sia sent at 05/07/2017  4:31 PM EDT ----- Regarding: Lab orders for Tuesday, 9.18.18 Patient is scheduled for CPX labs, please order future labs, Thanks , Karna Christmas

## 2017-05-13 ENCOUNTER — Other Ambulatory Visit (INDEPENDENT_AMBULATORY_CARE_PROVIDER_SITE_OTHER): Payer: PPO

## 2017-05-13 DIAGNOSIS — E7800 Pure hypercholesterolemia, unspecified: Secondary | ICD-10-CM

## 2017-05-13 DIAGNOSIS — Z0000 Encounter for general adult medical examination without abnormal findings: Secondary | ICD-10-CM

## 2017-05-13 LAB — CBC WITH DIFFERENTIAL/PLATELET
BASOS ABS: 0 10*3/uL (ref 0.0–0.1)
Basophils Relative: 0.8 % (ref 0.0–3.0)
EOS ABS: 0.1 10*3/uL (ref 0.0–0.7)
EOS PCT: 2.4 % (ref 0.0–5.0)
HCT: 41 % (ref 36.0–46.0)
Hemoglobin: 13.9 g/dL (ref 12.0–15.0)
LYMPHS ABS: 1.8 10*3/uL (ref 0.7–4.0)
Lymphocytes Relative: 30.4 % (ref 12.0–46.0)
MCHC: 33.8 g/dL (ref 30.0–36.0)
MCV: 96.3 fl (ref 78.0–100.0)
MONO ABS: 0.4 10*3/uL (ref 0.1–1.0)
MONOS PCT: 7.4 % (ref 3.0–12.0)
NEUTROS ABS: 3.5 10*3/uL (ref 1.4–7.7)
Neutrophils Relative %: 59 % (ref 43.0–77.0)
PLATELETS: 187 10*3/uL (ref 150.0–400.0)
RBC: 4.26 Mil/uL (ref 3.87–5.11)
RDW: 13.4 % (ref 11.5–15.5)
WBC: 5.9 10*3/uL (ref 4.0–10.5)

## 2017-05-13 LAB — COMPREHENSIVE METABOLIC PANEL
ALK PHOS: 111 U/L (ref 39–117)
ALT: 17 U/L (ref 0–35)
AST: 20 U/L (ref 0–37)
Albumin: 4.2 g/dL (ref 3.5–5.2)
BILIRUBIN TOTAL: 0.4 mg/dL (ref 0.2–1.2)
BUN: 15 mg/dL (ref 6–23)
CALCIUM: 10.3 mg/dL (ref 8.4–10.5)
CHLORIDE: 106 meq/L (ref 96–112)
CO2: 29 mEq/L (ref 19–32)
CREATININE: 0.79 mg/dL (ref 0.40–1.20)
GFR: 77.6 mL/min (ref >60.00)
Glucose, Bld: 95 mg/dL (ref 70–99)
Potassium: 4.5 mEq/L (ref 3.5–5.1)
Sodium: 140 mEq/L (ref 135–145)
TOTAL PROTEIN: 6.7 g/dL (ref 6.0–8.3)

## 2017-05-13 LAB — LIPID PANEL
CHOL/HDL RATIO: 4
CHOLESTEROL: 243 mg/dL — AB (ref 0–200)
HDL: 63.5 mg/dL (ref >39.00)
NonHDL: 179.32
Triglycerides: 218 mg/dL — ABNORMAL HIGH (ref 0.0–149.0)
VLDL: 43.6 mg/dL — ABNORMAL HIGH (ref 0.0–40.0)

## 2017-05-13 LAB — LDL CHOLESTEROL, DIRECT: LDL DIRECT: 144 mg/dL

## 2017-05-13 LAB — TSH: TSH: 3.37 u[IU]/mL (ref 0.35–4.50)

## 2017-05-14 ENCOUNTER — Other Ambulatory Visit: Payer: Self-pay

## 2017-05-21 ENCOUNTER — Ambulatory Visit (INDEPENDENT_AMBULATORY_CARE_PROVIDER_SITE_OTHER): Payer: PPO | Admitting: Family Medicine

## 2017-05-21 ENCOUNTER — Encounter: Payer: Self-pay | Admitting: Family Medicine

## 2017-05-21 VITALS — BP 130/78 | HR 62 | Temp 98.3°F | Ht 64.5 in | Wt 169.5 lb

## 2017-05-21 DIAGNOSIS — Z0000 Encounter for general adult medical examination without abnormal findings: Secondary | ICD-10-CM | POA: Insufficient documentation

## 2017-05-21 DIAGNOSIS — Z1159 Encounter for screening for other viral diseases: Secondary | ICD-10-CM

## 2017-05-21 DIAGNOSIS — Z23 Encounter for immunization: Secondary | ICD-10-CM

## 2017-05-21 DIAGNOSIS — E7800 Pure hypercholesterolemia, unspecified: Secondary | ICD-10-CM | POA: Diagnosis not present

## 2017-05-21 NOTE — Patient Instructions (Addendum)
Next time you are here -bring your living will/ power of attorney and we will scan it into chart   Flu shot and prevnar vaccine today   For cholesterol Avoid red meat/ fried foods/ egg yolks/ fatty breakfast meats/ butter, cheese and high fat dairy/ and shellfish

## 2017-05-21 NOTE — Progress Notes (Signed)
Subjective:    Patient ID: Alisha Long, female    DOB: 1951-03-19, 66 y.o.   MRN: [id number]  HPI I have personally reviewed the Medicare Annual Wellness questionnaire and have noted 1. The patient's medical and social history 2. Their use of alcohol, tobacco or illicit drugs 3. Their current medications and supplements 4. The patient's functional ability including ADL's, fall risks, home safety risks and hearing or visual             impairment. 5. Diet and physical activities 6. Evidence for depression or mood disorders   The patients weight, height, BMI have been recorded in the chart and visual acuity is per eye clinic.  I have made referrals, counseling and provided education to the patient based review of the above and I have provided the pt with a written personalized care plan for preventive services. Reviewed and updated provider list, see scanned forms.  Doing ok overall   See scanned forms.  Routine anticipatory guidance given to patient.  See health maintenance. Colon cancer screening colonoscopy 12/17 - 5 y recall for polyps  Breast cancer screening   Mammogram 11/17 and has one scheduled already for nov Self breast exam- no lumps  Hx of a hysterectomy for vaginal bleeding -no gyn problems or c/o  Flu vaccine- will get today  Tetanus vaccine 9/17  Pneumovax- will get PCV 13 today  Zoster vaccine- interested in shingrix later when avail/ had zostavax 2013  dexa nl 11/16    Takes vitamin D/ ca occas , exercises  No falls or fractures  Advance directive has living will and POA  Cognitive function addressed- see scanned forms- and if abnormal then additional documentation follows. -no concerns    PMH and SH reviewed  Meds, vitals, and allergies reviewed.   ROS: See HPI.  Otherwise negative.     Hearing Screening   125Hz  250Hz  500Hz  1000Hz  2000Hz  3000Hz  4000Hz  6000Hz  8000Hz   Right ear:   40 40 40  40    Left ear:   40 40 40  40    Vision Screening  Comments: Pt had eye exam with Dr. Herbert Deaner in 10/2016     hyperlipidemia  Lab Results  Component Value Date   CHOL 243 (H) 05/13/2017   CHOL 210 (H) 05/14/2016   CHOL 237 (H) 05/04/2015   Lab Results  Component Value Date   HDL 63.50 05/13/2017   HDL 50.50 05/14/2016   HDL 53.10 05/04/2015   Lab Results  Component Value Date   LDLCALC 128 (H) 08/17/2008   Lab Results  Component Value Date   TRIG 218.0 (H) 05/13/2017   TRIG 240.0 (H) 05/14/2016   TRIG 230.0 (H) 05/04/2015   Lab Results  Component Value Date   CHOLHDL 4 05/13/2017   CHOLHDL 4 05/14/2016   CHOLHDL 4 05/04/2015   Lab Results  Component Value Date   LDLDIRECT 144.0 05/13/2017   LDLDIRECT 135.0 05/14/2016   LDLDIRECT 162.0 05/04/2015  she is intolerant of statins and red yeast rice  Exercises more  On fish oil  Inc produce and eating less meats   Results for orders placed or performed in visit on 05/13/17  CBC with Differential/Platelet  Result Value Ref Range   WBC 5.9 4.0 - 10.5 K/uL   RBC 4.26 3.87 - 5.11 Mil/uL   Hemoglobin 13.9 12.0 - 15.0 g/dL   HCT 41.0 36.0 - 46.0 %   MCV 96.3 78.0 - 100.0 fl   MCHC 33.8 30.0 -  36.0 g/dL   RDW 13.4 11.5 - 15.5 %   Platelets 187.0 150.0 - 400.0 K/uL   Neutrophils Relative % 59.0 43.0 - 77.0 %   Lymphocytes Relative 30.4 12.0 - 46.0 %   Monocytes Relative 7.4 3.0 - 12.0 %   Eosinophils Relative 2.4 0.0 - 5.0 %   Basophils Relative 0.8 0.0 - 3.0 %   Neutro Abs 3.5 1.4 - 7.7 K/uL   Lymphs Abs 1.8 0.7 - 4.0 K/uL   Monocytes Absolute 0.4 0.1 - 1.0 K/uL   Eosinophils Absolute 0.1 0.0 - 0.7 K/uL   Basophils Absolute 0.0 0.0 - 0.1 K/uL  Comprehensive metabolic panel  Result Value Ref Range   Sodium 140 135 - 145 mEq/L   Potassium 4.5 3.5 - 5.1 mEq/L   Chloride 106 96 - 112 mEq/L   CO2 29 19 - 32 mEq/L   Glucose, Bld 95 70 - 99 mg/dL   BUN 15 6 - 23 mg/dL   Creatinine, Ser 0.79 0.40 - 1.20 mg/dL   Total Bilirubin 0.4 0.2 - 1.2 mg/dL   Alkaline  Phosphatase 111 39 - 117 U/L   AST 20 0 - 37 U/L   ALT 17 0 - 35 U/L   Total Protein 6.7 6.0 - 8.3 g/dL   Albumin 4.2 3.5 - 5.2 g/dL   Calcium 10.3 8.4 - 10.5 mg/dL   GFR 77.60 >60.00 mL/min  Lipid panel  Result Value Ref Range   Cholesterol 243 (H) 0 - 200 mg/dL   Triglycerides 218.0 (H) 0.0 - 149.0 mg/dL   HDL 63.50 >39.00 mg/dL   VLDL 43.6 (H) 0.0 - 40.0 mg/dL   Total CHOL/HDL Ratio 4    NonHDL 179.32   TSH  Result Value Ref Range   TSH 3.37 0.35 - 4.50 uIU/mL  LDL cholesterol, direct  Result Value Ref Range   Direct LDL 144.0 mg/dL     Patient Active Problem List   Diagnosis Date Noted  . Welcome to Medicare preventive visit 05/21/2017  . Skin tag 12/14/2016  . 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 Comedone 12/14/2016  . Need for hepatitis C screening test 05/14/2016  . Estrogen deficiency 05/24/2015  . Anal skin tag 10/27/2012  . Encounter for routine gynecological examination 04/15/2012  . Routine general medical examination at a health care facility 04/07/2012  . HEADACHE 11/01/2010  . COLONIC POLYPS 01/06/2010  . POSTNASAL DRIP 01/06/2010  . IRON DEFICIENCY ANEMIA, HX OF 11/07/2009  . OTHER SEBORRHEIC KERATOSIS 07/19/2009  . SYMPTOM, INSOMNIA NOS 06/18/2007  . Hyperlipidemia 06/11/2007   Past Medical History:  Diagnosis Date  . Colon polyps   . History of shingles   . Hyperlipidemia   . Hypertension   . Insomnia   . Palpitations   . Post-nasal drip    with chronic cough   Past Surgical History:  Procedure Laterality Date  . ABDOMINAL HYSTERECTOMY     bleeding, ovaries intact  . CARPAL TUNNEL RELEASE  11/2004  . CESAREAN SECTION     x 3  . CHOLECYSTECTOMY  12/2005  . COLONOSCOPY     polyps  . ESOPHAGOGASTRODUODENOSCOPY     nml  . gallstones     Abd [place]   . KNEE SURGERY     10/2000  . KNEE SURGERY  01/2001   replaced ACL   Social History  Substance Use Topics  . Smoking status: Former Research scientist (life sciences)  . Smokeless tobacco: Never Used  . Alcohol use  0.0 oz/week  Comment: wine every once in a while   Family History  Problem Relation Age of Onset  . Seizures Father   . Hyperlipidemia Mother   . Aneurysm Maternal Grandmother   . Hyperlipidemia Maternal Grandmother   . Hypertension Maternal Grandmother   . Colon cancer Neg Hx    Allergies  Allergen Reactions  . Penicillins Shortness Of Breath    REACTION: hives  . Amoxicillin     REACTION: hives  . Atorvastatin     REACTION: joint pain  . Bactrim [Sulfamethoxazole-Trimethoprim] Hives  . Cephalexin     REACTION: hives  . Mometasone Furoate     REACTION: headache  . Rosuvastatin     REACTION: headache   Current Outpatient Prescriptions on File Prior to Visit  Medication Sig Dispense Refill  . cholecalciferol (VITAMIN D) 1000 UNITS tablet Take 1,000 Units by mouth daily.      . Multiple Vitamins-Minerals (MULTIVITAMIN ADULTS 50+ PO) Take 1 capsule by mouth daily.    . naproxen sodium (ANAPROX) 220 MG tablet Take 440 mg by mouth 2 (two) times daily as needed.    . valACYclovir (VALTREX) 1000 MG tablet TAKE TWO (2) TABLETS BY MOUTH 2 TIMES DAILY AS NEEDED FOR COLD SORES (FOR ONE DAY) 12 tablet 4   Current Facility-Administered Medications on File Prior to Visit  Medication Dose Route Frequency Provider Last Rate Last Dose  . 0.9 %  sodium chloride infusion  500 mL Intravenous Continuous Milus Banister, MD        Review of Systems  Constitutional: Negative for activity change, appetite change, fatigue, fever and unexpected weight change.  HENT: Negative for congestion, ear pain, rhinorrhea, sinus pressure and sore throat.   Eyes: Negative for pain, redness and visual disturbance.  Respiratory: Negative for cough, shortness of breath and wheezing.   Cardiovascular: Negative for chest pain and palpitations.  Gastrointestinal: Negative for abdominal pain, blood in stool, constipation and diarrhea.  Endocrine: Negative for polydipsia and polyuria.  Genitourinary: Negative  for dysuria, frequency and urgency.  Musculoskeletal: Negative for arthralgias, back pain and myalgias.  Skin: Negative for pallor and rash.  Allergic/Immunologic: Negative for environmental allergies.  Neurological: Negative for dizziness, syncope and headaches.  Hematological: Negative for adenopathy. Does not bruise/bleed easily.  Psychiatric/Behavioral: Negative for decreased concentration and dysphoric mood. The patient is not nervous/anxious.        Objective:   Physical Exam  Constitutional: She appears well-developed and well-nourished. No distress.  overwt and well appearing   HENT:  Head: Normocephalic and atraumatic.  Right Ear: External ear normal.  Left Ear: External ear normal.  Mouth/Throat: Oropharynx is clear and moist.  Eyes: Pupils are equal, round, and reactive to light. Conjunctivae and EOM are normal. No scleral icterus.  Neck: Normal range of motion. Neck supple. No JVD present. Carotid bruit is not present. No thyromegaly present.  Cardiovascular: Normal rate, regular rhythm, normal heart sounds and intact distal pulses.  Exam reveals no gallop.   Pulmonary/Chest: Effort normal and breath sounds normal. No respiratory distress. She has no wheezes. She exhibits no tenderness.  Abdominal: Soft. Bowel sounds are normal. She exhibits no distension, no abdominal bruit and no mass. There is no tenderness.  Genitourinary: No breast swelling, tenderness, discharge or bleeding.  Genitourinary Comments: Breast exam: No mass, nodules, thickening, tenderness, bulging, retraction, inflamation, nipple discharge or skin changes noted.  No axillary or clavicular LA.      Musculoskeletal: Normal range of motion. She exhibits no edema or  tenderness.  Lymphadenopathy:    She has no cervical adenopathy.  Neurological: She is alert. She has normal reflexes. No cranial nerve deficit. She exhibits normal muscle tone. Coordination normal.  Skin: Skin is warm and dry. No rash noted. No  erythema. No pallor.  Solar lentigines diffusely Some SKs  Psychiatric: She has a normal mood and affect.          Assessment & Plan:   Problem List Items Addressed This Visit      Other   Hyperlipidemia - Primary    Disc goals for lipids and reasons to control them Rev labs with pt Rev low sat fat diet in detail  HDL is up  LDL still high and trig  Handout given  Disc diet       Need for hepatitis C screening test    Negative screen      Welcome to Medicare preventive visit    Reviewed health habits including diet and exercise and skin cancer prevention Reviewed appropriate screening tests for age  Also reviewed health mt list, fam hx and immunization status , as well as social and family history   See HPI  Nl hearing screen Neg depression screen No falls  Will bring copy of POA and living will to scan in the chart No cognitive deficits  Flu shot and prevnar vaccine today  Labs reviewed        Other Visit Diagnoses    Need for influenza vaccination       Relevant Orders   Flu Vaccine QUAD 6+ mos PF IM (Fluarix Quad PF) (Completed)   Need for vaccination with 13-polyvalent pneumococcal conjugate vaccine       Relevant Orders   Pneumococcal conjugate vaccine 13-valent (Completed)

## 2017-05-21 NOTE — Assessment & Plan Note (Signed)
Disc goals for lipids and reasons to control them Rev labs with pt Rev low sat fat diet in detail  HDL is up  LDL still high and trig  Handout given  Disc diet

## 2017-05-21 NOTE — Assessment & Plan Note (Signed)
Negative  screen!

## 2017-05-21 NOTE — Assessment & Plan Note (Signed)
Reviewed health habits including diet and exercise and skin cancer prevention Reviewed appropriate screening tests for age  Also reviewed health mt list, fam hx and immunization status , as well as social and family history   See HPI  Nl hearing screen Neg depression screen No falls  Will bring copy of POA and living will to scan in the chart No cognitive deficits  Flu shot and prevnar vaccine today  Labs reviewed

## 2017-06-18 DIAGNOSIS — H2513 Age-related nuclear cataract, bilateral: Secondary | ICD-10-CM | POA: Diagnosis not present

## 2017-06-18 DIAGNOSIS — H35371 Puckering of macula, right eye: Secondary | ICD-10-CM | POA: Diagnosis not present

## 2017-06-18 DIAGNOSIS — H2511 Age-related nuclear cataract, right eye: Secondary | ICD-10-CM | POA: Diagnosis not present

## 2017-06-18 DIAGNOSIS — H04123 Dry eye syndrome of bilateral lacrimal glands: Secondary | ICD-10-CM | POA: Diagnosis not present

## 2017-06-18 DIAGNOSIS — H25013 Cortical age-related cataract, bilateral: Secondary | ICD-10-CM | POA: Diagnosis not present

## 2017-06-26 ENCOUNTER — Other Ambulatory Visit: Payer: Self-pay | Admitting: Family Medicine

## 2017-07-01 ENCOUNTER — Telehealth: Payer: Self-pay

## 2017-07-01 NOTE — Telephone Encounter (Signed)
Pt has a crick in neck since last week;neck hurts worse when turns neck to the ltfe. Pt can touch chin to neck. pt has tried OTC meds,exercises,heat and cold,Valium as muscle relaxant with no relief; pt request med previously given; ? Flexeril; last refilled # 30 x 2 on 03/24/2013. Pt had med wellness on 05/21/17. Pt wants to know if med can be sent to [facility] without being seen? Pt request cb.

## 2017-07-02 MED ORDER — CYCLOBENZAPRINE HCL 10 MG PO TABS: 10.0000 mg | ORAL_TABLET | Freq: Three times a day (TID) | ORAL | 0 refills | Status: DC | PRN

## 2017-07-02 NOTE — Addendum Note (Signed)
Addended by: Loura Pardon A on: 07/02/2017 05:12 PM   Modules accepted: Orders

## 2017-07-02 NOTE — Telephone Encounter (Signed)
I sent flexeril to [facility]  Use with caution of sedation  Do not use it with valium   If no improvement soon please f/u

## 2017-07-02 NOTE — Telephone Encounter (Signed)
Patient advised.

## 2017-07-02 NOTE — Telephone Encounter (Signed)
Pt calling again today to check on status of pain medicine

## 2017-07-11 ENCOUNTER — Ambulatory Visit
Admission: RE | Admit: 2017-07-11 | Discharge: 2017-07-11 | Disposition: A | Discharge status: Home / Self Care | Payer: PPO | Source: Ambulatory Visit | Attending: Family Medicine | Admitting: Family Medicine

## 2017-07-11 DIAGNOSIS — Z1231 Encounter for screening mammogram for malignant neoplasm of breast: Secondary | ICD-10-CM | POA: Diagnosis not present

## 2017-07-31 DIAGNOSIS — H2511 Age-related nuclear cataract, right eye: Secondary | ICD-10-CM | POA: Diagnosis not present

## 2017-07-31 DIAGNOSIS — H25811 Combined forms of age-related cataract, right eye: Secondary | ICD-10-CM | POA: Diagnosis not present

## 2017-10-16 ENCOUNTER — Ambulatory Visit: Payer: PPO | Admitting: Family Medicine

## 2017-10-16 ENCOUNTER — Ambulatory Visit (INDEPENDENT_AMBULATORY_CARE_PROVIDER_SITE_OTHER): Payer: PPO | Admitting: Family Medicine

## 2017-10-16 ENCOUNTER — Encounter: Payer: Self-pay | Admitting: Family Medicine

## 2017-10-16 VITALS — BP 128/76 | HR 74 | Temp 98.4°F | Ht 64.5 in | Wt 166.5 lb

## 2017-10-16 DIAGNOSIS — L821 Other seborrheic keratosis: Secondary | ICD-10-CM

## 2017-10-16 DIAGNOSIS — L918 Other hypertrophic disorders of the skin: Secondary | ICD-10-CM | POA: Diagnosis not present

## 2017-10-16 NOTE — Progress Notes (Signed)
Subjective:    Patient ID: Alisha Long, female    DOB: 1951/03/29, 67 y.o.   MRN: [id number]  HPI Here for skin tag removal on neck   Tag on anterior neck- very bothersome  Catches clothing and jewelry  Once fell off and came right back   Has sun spots Sees derm once per year  Would be interested in having some removed from face     Patient Active Problem List   Diagnosis Date Noted  . Welcome to Medicare preventive visit 05/21/2017  . Skin tag 12/14/2016  . 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 Need for hepatitis C screening test 05/14/2016  . Estrogen deficiency 05/24/2015  . Anal skin tag 10/27/2012  . Encounter for routine gynecological examination 04/15/2012  . Routine general medical examination at a health care facility 04/07/2012  . HEADACHE 11/01/2010  . COLONIC POLYPS 01/06/2010  . POSTNASAL DRIP 01/06/2010  . IRON DEFICIENCY ANEMIA, HX OF 11/07/2009  . OTHER SEBORRHEIC KERATOSIS 07/19/2009  . SYMPTOM, INSOMNIA NOS 06/18/2007  . Hyperlipidemia 06/11/2007   Past Medical History:  Diagnosis Date  . Colon polyps   . History of shingles   . Hyperlipidemia   . Hypertension   . Insomnia   . Palpitations   . Post-nasal drip    with chronic cough   Past Surgical History:  Procedure Laterality Date  . ABDOMINAL HYSTERECTOMY     bleeding, ovaries intact  . BREAST EXCISIONAL BIOPSY Left   . CARPAL TUNNEL RELEASE  11/2004  . CESAREAN SECTION     x 3  . CHOLECYSTECTOMY  12/2005  . COLONOSCOPY     polyps  . ESOPHAGOGASTRODUODENOSCOPY     nml  . gallstones     Abd [place]   . KNEE SURGERY     10/2000  . KNEE SURGERY  01/2001   replaced ACL   Social History   Tobacco Use  . Smoking status: Former Research scientist (life sciences)  . Smokeless tobacco: Never Used  Substance Use Topics  . Alcohol use: Yes    Alcohol/week: 0.0 oz    Comment: wine every once in a while  . Drug use: No   Family History  Problem Relation Age of Onset  . Seizures Father   . Hyperlipidemia  Mother   . Aneurysm Maternal Grandmother   . Hyperlipidemia Maternal Grandmother   . Hypertension Maternal Grandmother   . Colon cancer Neg Hx   . Breast cancer Neg Hx    Allergies  Allergen Reactions  . Penicillins Shortness Of Breath    REACTION: hives  . Amoxicillin     REACTION: hives  . Atorvastatin     REACTION: joint pain  . Bactrim [Sulfamethoxazole-Trimethoprim] Hives  . Cephalexin     REACTION: hives  . Mometasone Furoate     REACTION: headache  . Rosuvastatin     REACTION: headache   Current Outpatient Medications on File Prior to Visit  Medication Sig Dispense Refill  . cholecalciferol (VITAMIN D) 1000 UNITS tablet Take 1,000 Units by mouth daily.      . cyclobenzaprine (FLEXERIL) 10 MG tablet Take 1 tablet (10 mg total) 3 (three) times daily as needed by mouth for muscle spasms. Caution of sedation 30 tablet 0  . Multiple Vitamins-Minerals (MULTIVITAMIN ADULTS 50+ PO) Take 1 capsule by mouth daily.    . naproxen sodium (ANAPROX) 220 MG tablet Take 440 mg by mouth 2 (two) times daily as needed.    . Omega-3 Fatty Acids (FISH OIL PO)  Take 1 capsule by mouth daily.    . valACYclovir (VALTREX) 1000 MG tablet TAKE 2 TABLETS BY MOUTH 2 TIMES DAILY ASNEEDED FOR COLD SORES FOR 1 DAY 12 tablet 5   Current Facility-Administered Medications on File Prior to Visit  Medication Dose Route Frequency Provider Last Rate Last Dose  . 0.9 %  sodium chloride infusion  500 mL Intravenous Continuous Milus Banister, MD        Review of Systems  Constitutional: Negative for activity change, appetite change, fatigue, fever and unexpected weight change.  HENT: Negative for congestion, ear pain, rhinorrhea, sinus pressure and sore throat.   Eyes: Negative for pain, redness and visual disturbance.  Respiratory: Negative for cough, shortness of breath and wheezing.   Cardiovascular: Negative for chest pain and palpitations.  Gastrointestinal: Negative for abdominal pain, blood in stool,  constipation and diarrhea.  Endocrine: Negative for polydipsia and polyuria.  Genitourinary: Negative for dysuria, frequency and urgency.  Musculoskeletal: Negative for arthralgias, back pain and myalgias.  Skin: Negative for pallor and rash.       Pos for bothersome skin tag on neck   "sun spots" on face   Freckles   No rash  Allergic/Immunologic: Negative for environmental allergies.  Neurological: Negative for dizziness, syncope and headaches.  Hematological: Negative for adenopathy. Does not bruise/bleed easily.  Psychiatric/Behavioral: Negative for decreased concentration and dysphoric mood. The patient is not nervous/anxious.        Objective:   Physical Exam  Constitutional: She appears well-developed and well-nourished. No distress.  Well appearing   HENT:  Head: Normocephalic and atraumatic.  Eyes: Conjunctivae and EOM are normal. Pupils are equal, round, and reactive to light. No scleral icterus.  Neck: Normal range of motion. Neck supple. No thyromegaly present.  Cardiovascular: Normal rate and regular rhythm.  Lymphadenopathy:    She has no cervical adenopathy.  Skin: Skin is warm and dry. No rash noted. No erythema. No pallor.  2-3 mm irritated pink skin tag- L anterior neck  Removed by clipping-pt tolerated well   Solar lentigines diffusely Some sks  No rash  Solar aging noted   Psychiatric: She has a normal mood and affect.          Assessment & Plan:   Problem List Items Addressed This Visit      Musculoskeletal and Integument   Seborrheic keratoses    Pt has several of these on trunk  Sees dermatology for these and skin screen Disc use of sun protection to prevent skin cancer       Skin tag - Primary    New abraded/irritated skin tag L ant neck  2-3 mm and keratotic  Cleaned and prepped  Removed with clipping (scissors) at base after freeze spray for anesthesia Silver nitrate-sparingly for hemostasis (no bleeding occurred) Dressed with  triple abx oint and band aid  After care discussed  Avoid friction or submersion until healed Update if red or s/s of infection

## 2017-10-16 NOTE — Patient Instructions (Signed)
I clipped a small skin tag Keep area clean with soap and water  Use antibiotic ointment and band aid until healed   Update me if any problems  Keep wearing sunscreen

## 2017-10-16 NOTE — Assessment & Plan Note (Signed)
New abraded/irritated skin tag L ant neck  2-3 mm and keratotic  Cleaned and prepped  Removed with clipping (scissors) at base after freeze spray for anesthesia Silver nitrate-sparingly for hemostasis (no bleeding occurred) Dressed with triple abx oint and band aid  After care discussed  Avoid friction or submersion until healed Update if red or s/s of infection

## 2017-10-16 NOTE — Assessment & Plan Note (Signed)
Pt has several of these on trunk  Sees dermatology for these and skin screen Disc use of sun protection to prevent skin cancer

## 2017-12-26 DIAGNOSIS — H25812 Combined forms of age-related cataract, left eye: Secondary | ICD-10-CM | POA: Diagnosis not present

## 2017-12-26 DIAGNOSIS — H2512 Age-related nuclear cataract, left eye: Secondary | ICD-10-CM | POA: Diagnosis not present

## 2018-02-19 ENCOUNTER — Encounter: Payer: Self-pay | Admitting: Family Medicine

## 2018-03-03 ENCOUNTER — Ambulatory Visit (INDEPENDENT_AMBULATORY_CARE_PROVIDER_SITE_OTHER): Payer: PPO | Admitting: Family Medicine

## 2018-03-03 ENCOUNTER — Encounter: Payer: Self-pay | Admitting: Family Medicine

## 2018-03-03 VITALS — BP 128/80 | HR 100 | Temp 98.6°F | Ht 62.5 in | Wt 164.0 lb

## 2018-03-03 DIAGNOSIS — L438 Other lichen planus: Secondary | ICD-10-CM | POA: Insufficient documentation

## 2018-03-03 DIAGNOSIS — W57XXXA Bitten or stung by nonvenomous insect and other nonvenomous arthropods, initial encounter: Secondary | ICD-10-CM | POA: Diagnosis not present

## 2018-03-03 MED ORDER — TRIAMCINOLONE ACETONIDE 0.1 % EX CREA: 1.0000 "application " | TOPICAL_CREAM | Freq: Two times a day (BID) | CUTANEOUS | 0 refills | Status: DC

## 2018-03-03 NOTE — Progress Notes (Signed)
Subjective:    Patient ID: Alisha Long, female    DOB: 1951/01/24, 67 y.o.   MRN: [id number]  HPI Here for skin/mouth issues  Was diagnosed with lichen planus from her dentist  Of note- Hep C and HIV screens were neg in 2017  Started on cheeks / a little on tongue  The mucosa sloughed off on inside of cheeks  Using fluocinonide gel (lidex) which is helping     Chigger bites  Exposed on Saturday in [place]ed area  Did not see them  Itching - sat night- low back /down legs (was wearing shoes and socks)  No mosquitos seen  No poison ivy   Putting benadryl cream and spray  Taking oral benadryl   Patient Active Problem List   Diagnosis Date Noted  . 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 Insect bites 03/03/2018  . Welcome to Medicare preventive visit 05/21/2017  . Skin tag 12/14/2016  . 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 Need for hepatitis C screening test 05/14/2016  . Estrogen deficiency 05/24/2015  . Anal skin tag 10/27/2012  . Encounter for routine gynecological examination 04/15/2012  . Routine general medical examination at a health care facility 04/07/2012  . HEADACHE 11/01/2010  . COLONIC POLYPS 01/06/2010  . POSTNASAL DRIP 01/06/2010  . IRON DEFICIENCY ANEMIA, HX OF 11/07/2009  . OTHER SEBORRHEIC KERATOSIS 07/19/2009  . SYMPTOM, INSOMNIA NOS 06/18/2007  . Hyperlipidemia 06/11/2007   Past Medical History:  Diagnosis Date  . Colon polyps   . History of shingles   . Hyperlipidemia   . Hypertension   . Insomnia   . Palpitations   . Post-nasal drip    with chronic cough   Past Surgical History:  Procedure Laterality Date  . ABDOMINAL HYSTERECTOMY     bleeding, ovaries intact  . BREAST EXCISIONAL BIOPSY Left   . CARPAL TUNNEL RELEASE  11/2004  . CESAREAN SECTION     x 3  . CHOLECYSTECTOMY  12/2005  . COLONOSCOPY     polyps  . ESOPHAGOGASTRODUODENOSCOPY     nml  . gallstones     Abd [place]   . KNEE SURGERY     10/2000  . KNEE SURGERY  01/2001   replaced ACL   Social History   Tobacco Use  . Smoking status: Former Research scientist (life sciences)  . Smokeless tobacco: Never Used  Substance Use Topics  . Alcohol use: Yes    Alcohol/week: 0.0 oz    Comment: wine every once in a while  . Drug use: No   Family History  Problem Relation Age of Onset  . Seizures Father   . Hyperlipidemia Mother   . Aneurysm Maternal Grandmother   . Hyperlipidemia Maternal Grandmother   . Hypertension Maternal Grandmother   . Colon cancer Neg Hx   . Breast cancer Neg Hx    Allergies  Allergen Reactions  . Penicillins Shortness Of Breath    REACTION: hives  . Amoxicillin     REACTION: hives  . Atorvastatin     REACTION: joint pain  . Bactrim [Sulfamethoxazole-Trimethoprim] Hives  . Cephalexin     REACTION: hives  . Mometasone Furoate     REACTION: headache  . Rosuvastatin     REACTION: headache   Current Outpatient Medications on File Prior to Visit  Medication Sig Dispense Refill  . cholecalciferol (VITAMIN D) 1000 UNITS tablet Take 1,000 Units by mouth daily.      . cyclobenzaprine (FLEXERIL) 10 MG tablet Take 1 tablet (10 mg total)  3 (three) times daily as needed by mouth for muscle spasms. Caution of sedation 30 tablet 0  . fluocinonide gel (LIDEX) 0.05 % APPLY SPARINGLY TO THE AFFECTED AREA 3 TIMES DAILY UNTIL RESOLVED. NOTHING BY MOUTH FOR 30 MINS  1  . Multiple Vitamins-Minerals (MULTIVITAMIN ADULTS 50+ PO) Take 1 capsule by mouth daily.    . naproxen sodium (ANAPROX) 220 MG tablet Take 440 mg by mouth 2 (two) times daily as needed.    . Omega-3 Fatty Acids (FISH OIL PO) Take 1 capsule by mouth daily.    . valACYclovir (VALTREX) 1000 MG tablet TAKE 2 TABLETS BY MOUTH 2 TIMES DAILY ASNEEDED FOR COLD SORES FOR 1 DAY 12 tablet 5   Current Facility-Administered Medications on File Prior to Visit  Medication Dose Route Frequency Provider Last Rate Last Dose  . 0.9 %  sodium chloride infusion  500 mL Intravenous Continuous Milus Banister, MD           Review of Systems  Constitutional: Negative for activity change, appetite change, fatigue, fever and unexpected weight change.  HENT: Negative for congestion, ear pain, mouth sores, rhinorrhea, sinus pressure and sore throat.        Mouth lichen planus  Eyes: Negative for pain, redness and visual disturbance.  Respiratory: Negative for cough, shortness of breath and wheezing.   Cardiovascular: Negative for chest pain and palpitations.  Gastrointestinal: Negative for abdominal pain, blood in stool, constipation and diarrhea.  Endocrine: Negative for polydipsia and polyuria.  Genitourinary: Negative for dysuria, frequency and urgency.  Musculoskeletal: Negative for arthralgias, back pain and myalgias.  Skin: Positive for rash. Negative for pallor and wound.       Insect bites  Allergic/Immunologic: Negative for environmental allergies.  Neurological: Negative for dizziness, syncope and headaches.  Hematological: Negative for adenopathy. Does not bruise/bleed easily.  Psychiatric/Behavioral: Negative for decreased concentration and dysphoric mood. The patient is not nervous/anxious.        Objective:   Physical Exam  Constitutional: She appears well-developed and well-nourished. No distress.  Well appearing   HENT:  Head: Normocephalic and atraumatic.  Mouth/Throat: Oropharynx is clear and moist. No oropharyngeal exudate.  Scant white streaks inside buccal mucosa and sides of tongue Not scrapable with tongue blade Not tender No ulceration or redness  Eyes: Pupils are equal, round, and reactive to light. Conjunctivae and EOM are normal.  Neck: Normal range of motion. Neck supple.  Cardiovascular: Normal rate, regular rhythm and normal heart sounds.  Pulmonary/Chest: Effort normal and breath sounds normal. She has no wheezes.  Lymphadenopathy:    She has no cervical adenopathy.  Skin: Skin is warm and dry.  Insect bites (1 cm areas of induration and erythema w/o skin breakdown)  - worst on L upper leg  Some on R leg and also upper posterior arms   No rash on face/scalp  No signs of plant dermatitis   Psychiatric: She has a normal mood and affect.  Pleasant           Assessment & Plan:   Problem List Items Addressed This Visit      Digestive   Oral lichen planus    Mild/ treated with lidex from her dentist (helping)  Buccal mucosa-sides of tongue No particular trauma known No other symptoms  Neg Hep C/ HIV screen in the past        Other   Insect bites - Primary

## 2018-03-03 NOTE — Patient Instructions (Signed)
Keep insect bites clean with soap and water Try not to scratch  Oral antihistamine- benadryl or zyrtec may help (watch out for sedation)  Avoid heat   Cold compresses help  Use the triamcinolone cream as needed   Update if not starting to improve in a week or if worsening    Remember insect repellent in the future   For lichen planus - continue the lidex  Let your dentist know if there is no improvement

## 2018-03-03 NOTE — Assessment & Plan Note (Signed)
Mild/ treated with lidex from her dentist (helping)  Buccal mucosa-sides of tongue No particular trauma known No other symptoms  Neg Hep C/ HIV screen in the past

## 2018-03-27 DIAGNOSIS — T1490XA Injury, unspecified, initial encounter: Secondary | ICD-10-CM | POA: Diagnosis not present

## 2018-05-14 DIAGNOSIS — L814 Other melanin hyperpigmentation: Secondary | ICD-10-CM | POA: Diagnosis not present

## 2018-05-14 DIAGNOSIS — B353 Tinea pedis: Secondary | ICD-10-CM | POA: Diagnosis not present

## 2018-05-14 DIAGNOSIS — Z85828 Personal history of other malignant neoplasm of skin: Secondary | ICD-10-CM | POA: Diagnosis not present

## 2018-05-14 DIAGNOSIS — D225 Melanocytic nevi of trunk: Secondary | ICD-10-CM | POA: Diagnosis not present

## 2018-05-14 DIAGNOSIS — L821 Other seborrheic keratosis: Secondary | ICD-10-CM | POA: Diagnosis not present

## 2018-05-18 ENCOUNTER — Telehealth: Payer: Self-pay | Admitting: Family Medicine

## 2018-05-18 DIAGNOSIS — E7800 Pure hypercholesterolemia, unspecified: Secondary | ICD-10-CM

## 2018-05-18 DIAGNOSIS — Z0000 Encounter for general adult medical examination without abnormal findings: Secondary | ICD-10-CM

## 2018-05-18 NOTE — Telephone Encounter (Signed)
-----   Message from Eustace Pen, LPN sent at 8/61/4830  1:17 PM EDT ----- Regarding: Labs 9/27 Lab orders needed. Thank you.  Insurance:  [employer]

## 2018-05-22 ENCOUNTER — Ambulatory Visit: Payer: PPO

## 2018-05-23 ENCOUNTER — Ambulatory Visit (INDEPENDENT_AMBULATORY_CARE_PROVIDER_SITE_OTHER): Payer: PPO

## 2018-05-23 VITALS — BP 126/80 | HR 61 | Temp 98.5°F | Ht 64.5 in | Wt 161.5 lb

## 2018-05-23 DIAGNOSIS — Z0000 Encounter for general adult medical examination without abnormal findings: Secondary | ICD-10-CM

## 2018-05-23 DIAGNOSIS — Z23 Encounter for immunization: Secondary | ICD-10-CM

## 2018-05-23 DIAGNOSIS — E7800 Pure hypercholesterolemia, unspecified: Secondary | ICD-10-CM | POA: Diagnosis not present

## 2018-05-23 LAB — COMPREHENSIVE METABOLIC PANEL
ALK PHOS: 115 U/L (ref 39–117)
ALT: 16 U/L (ref 0–35)
AST: 19 U/L (ref 0–37)
Albumin: 4.2 g/dL (ref 3.5–5.2)
BILIRUBIN TOTAL: 0.5 mg/dL (ref 0.2–1.2)
BUN: 13 mg/dL (ref 6–23)
CO2: 31 meq/L (ref 19–32)
Calcium: 10.8 mg/dL — ABNORMAL HIGH (ref 8.4–10.5)
Chloride: 106 mEq/L (ref 96–112)
Creatinine, Ser: 0.76 mg/dL (ref 0.40–1.20)
GFR: 80.89 mL/min (ref >60.00)
GLUCOSE: 90 mg/dL (ref 70–99)
Potassium: 4.7 mEq/L (ref 3.5–5.1)
SODIUM: 142 meq/L (ref 135–145)
TOTAL PROTEIN: 6.9 g/dL (ref 6.0–8.3)

## 2018-05-23 LAB — LIPID PANEL
CHOL/HDL RATIO: 4
Cholesterol: 234 mg/dL — ABNORMAL HIGH (ref 0–200)
HDL: 53.7 mg/dL (ref >39.00)
LDL Cholesterol: 144 mg/dL — ABNORMAL HIGH (ref 0–99)
NonHDL: 180.23
Triglycerides: 183 mg/dL — ABNORMAL HIGH (ref 0.0–149.0)
VLDL: 36.6 mg/dL (ref 0.0–40.0)

## 2018-05-23 LAB — CBC WITH DIFFERENTIAL/PLATELET
BASOS ABS: 0 10*3/uL (ref 0.0–0.1)
Basophils Relative: 0.7 % (ref 0.0–3.0)
EOS PCT: 1.8 % (ref 0.0–5.0)
Eosinophils Absolute: 0.1 10*3/uL (ref 0.0–0.7)
HCT: 42.5 % (ref 36.0–46.0)
HEMOGLOBIN: 14.6 g/dL (ref 12.0–15.0)
LYMPHS ABS: 1.8 10*3/uL (ref 0.7–4.0)
Lymphocytes Relative: 28.4 % (ref 12.0–46.0)
MCHC: 34.4 g/dL (ref 30.0–36.0)
MCV: 94.1 fl (ref 78.0–100.0)
MONO ABS: 0.5 10*3/uL (ref 0.1–1.0)
MONOS PCT: 7.3 % (ref 3.0–12.0)
NEUTROS PCT: 61.8 % (ref 43.0–77.0)
Neutro Abs: 4 10*3/uL (ref 1.4–7.7)
Platelets: 174 10*3/uL (ref 150.0–400.0)
RBC: 4.51 Mil/uL (ref 3.87–5.11)
RDW: 13 % (ref 11.5–15.5)
WBC: 6.5 10*3/uL (ref 4.0–10.5)

## 2018-05-23 LAB — TSH: TSH: 2.59 u[IU]/mL (ref 0.35–4.50)

## 2018-05-23 NOTE — Progress Notes (Signed)
Subjective:   Alisha Long is a 67 y.o. female who presents for an Initial Medicare Annual Wellness Visit.  Review of Systems    N/A  Cardiac Risk Factors include: advanced age (>24men, >39 women);dyslipidemia     Objective:    Today's Vitals   05/23/18 1128  BP: 126/80  Pulse: 61  Temp: 98.5 F (36.9 C)  TempSrc: Oral  SpO2: 98%  Weight: 161 lb 8 oz (73.3 kg)  Height: 5' 4.5" (1.638 m)  PainSc: 0-No pain   Body mass index is 27.29 kg/m.  Advanced Directives 05/23/2018 07/31/2016 07/17/2016  Does Patient Have a Medical Advance Directive? Yes Yes Yes  Type of Advance Directive Living will - -  Would patient like information on creating a medical advance directive? No - Patient declined - -    Current Medications (verified) Outpatient Encounter Medications as of 05/23/2018  Medication Sig  . cholecalciferol (VITAMIN D) 1000 UNITS tablet Take 1,000 Units by mouth daily.    . cyclobenzaprine (FLEXERIL) 10 MG tablet Take 1 tablet (10 mg total) 3 (three) times daily as needed by mouth for muscle spasms. Caution of sedation  . fluocinonide gel (LIDEX) 0.05 % APPLY SPARINGLY TO THE AFFECTED AREA 3 TIMES DAILY UNTIL RESOLVED. NOTHING BY MOUTH FOR 30 MINS  . Multiple Vitamins-Minerals (MULTIVITAMIN ADULTS 50+ PO) Take 1 capsule by mouth daily.  . naproxen sodium (ANAPROX) 220 MG tablet Take 440 mg by mouth 2 (two) times daily as needed.  . Omega-3 Fatty Acids (FISH OIL PO) Take 1 capsule by mouth daily.  Marland Kitchen triamcinolone cream (KENALOG) 0.1 % Apply 1 application topically 2 (two) times daily. To affected areas  . valACYclovir (VALTREX) 1000 MG tablet TAKE 2 TABLETS BY MOUTH 2 TIMES DAILY ASNEEDED FOR COLD SORES FOR 1 DAY   Facility-Administered Encounter Medications as of 05/23/2018  Medication  . 0.9 %  sodium chloride infusion    Allergies (verified) Penicillins; Amoxicillin; Atorvastatin; Bactrim [sulfamethoxazole-trimethoprim]; Cephalexin; Mometasone furoate; and  Rosuvastatin   History: Past Medical History:  Diagnosis Date  . Colon polyps   . History of shingles   . Hyperlipidemia   . Hypertension   . Insomnia   . Palpitations   . Post-nasal drip    with chronic cough   Past Surgical History:  Procedure Laterality Date  . ABDOMINAL HYSTERECTOMY     bleeding, ovaries intact  . BREAST EXCISIONAL BIOPSY Left   . CARPAL TUNNEL RELEASE  11/2004  . CESAREAN SECTION     x 3  . CHOLECYSTECTOMY  12/2005  . COLONOSCOPY     polyps  . ESOPHAGOGASTRODUODENOSCOPY     nml  . gallstones     Abd [place]   . KNEE SURGERY     10/2000  . KNEE SURGERY  01/2001   replaced ACL   Family History  Problem Relation Age of Onset  . Seizures Father   . Hyperlipidemia Mother   . Aneurysm Maternal Grandmother   . Hyperlipidemia Maternal Grandmother   . Hypertension Maternal Grandmother   . Colon cancer Neg Hx   . Breast cancer Neg Hx    Social History   Socioeconomic History  . Marital status: Married    Spouse name: Not on file  . Number of children: 3  . Years of education: Not on file  . Highest education level: Not on file  Occupational History  . Occupation: Arboriculturist: Armed forces technical officer: UNEMPLOYED  Social Needs  .  Financial resource strain: Not on file  . Food insecurity:    Worry: Not on file    Inability: Not on file  . Transportation needs:    Medical: Not on file    Non-medical: Not on file  Tobacco Use  . Smoking status: Former Research scientist (life sciences)  . Smokeless tobacco: Never Used  Substance and Sexual Activity  . Alcohol use: Yes    Alcohol/week: 0.0 standard drinks    Comment: wine every once in a while  . Drug use: No  . Sexual activity: Not on file  Lifestyle  . Physical activity:    Days per week: Not on file    Minutes per session: Not on file  . Stress: Not on file  Relationships  . Social connections:    Talks on phone: Not on file    Gets together: Not on file    Attends religious service: Not  on file    Active member of club or organization: Not on file    Attends meetings of clubs or organizations: Not on file    Relationship status: Not on file  Other Topics Concern  . Not on file  Social History Narrative   Married, 3 children. Works for [employer], gets regular exercise.     Tobacco Counseling Counseling given: No   Clinical Intake:  Pre-visit preparation completed: Yes  Pain : No/denies pain Pain Score: 0-No pain     Nutritional Status: BMI 25 -29 Overweight Nutritional Risks: None Diabetes: No  How often do you need to have someone help you when you read instructions, pamphlets, or other written materials from your doctor or pharmacy?: 1 - Never What is the last grade level you completed in school?: Bachelor degree  Interpreter Needed?: No  Comments: pt lives with spouse Information entered by :: LPinson, LPN   Activities of Daily Living In your present state of health, do you have any difficulty performing the following activities: 05/23/2018  Hearing? N  Vision? N  Difficulty concentrating or making decisions? N  Walking or climbing stairs? N  Dressing or bathing? N  Doing errands, shopping? N  Preparing Food and eating ? N  Using the Toilet? N  In the past six months, have you accidently leaked urine? N  Do you have problems with loss of bowel control? N  Managing your Medications? N  Managing your Finances? N  Housekeeping or managing your Housekeeping? N  Some recent data might be hidden     Immunizations and Health Maintenance Immunization History  Administered Date(s) Administered  . Influenza,inj,Quad PF,6+ Mos 06/30/2014, 05/13/2015, 05/14/2016, 05/21/2017, 05/23/2018  . Pneumococcal Conjugate-13 05/21/2017  . Pneumococcal Polysaccharide-23 05/23/2018  . Td 04/27/2003, 09/09/2013  . Tdap 05/14/2016  . Zoster 05/06/2012   There are no preventive care reminders to display for this patient.  Patient Care Team: Tower, Wynelle Fanny, MD as PCP - General  Indicate any recent Medical Services you may have received from other than [facility] providers in the past year (date may be approximate).     Assessment:   This is a routine wellness examination for Alisha Long.  Hearing/Vision screen  Hearing Screening   125Hz  250Hz  500Hz  1000Hz  2000Hz  3000Hz  4000Hz  6000Hz  8000Hz   Right ear:   40 40 40  40    Left ear:   40 40 40  40    Vision Screening Comments: Vision exam in June 2019 with Dr. Matthew Saras  Dietary issues and exercise activities discussed: Current Exercise Habits: Structured exercise  class, Type of exercise: Other - see comments(gym/Silver Sneakers), Time (Minutes): 60, Frequency (Times/Week): 3, Weekly Exercise (Minutes/Week): 180, Intensity: Moderate, Exercise limited by: None identified  Goals    . Increase physical activity     Starting 05/23/2018, I will continue to exercise for 60 minutes 3 days per week.       Depression Screen PHQ 2/9 Scores 05/23/2018 05/21/2017  PHQ - 2 Score 0 0  PHQ- 9 Score 0 -    Fall Risk Fall Risk  05/23/2018 05/21/2017  Falls in the past year? No No   Cognitive Function: MMSE - Mini Mental State Exam 05/23/2018  Orientation to time 5  Orientation to Place 5  Registration 3  Attention/ Calculation 0  Recall 3  Language- name 2 objects 0  Language- repeat 1  Language- follow 3 step command 3  Language- read & follow direction 0  Write a sentence 0  Copy design 0  Total score 20     PLEASE NOTE: A Mini-Cog screen was completed. Maximum score is 20. A value of 0 denotes this part of [place] MMSE was not completed or the patient failed this part of the Mini-Cog screening.   Mini-Cog Screening Orientation to Time - Max 5 pts Orientation to Place - Max 5 pts Registration - Max 3 pts Recall - Max 3 pts Language Repeat - Max 1 pts Language Follow 3 Step Command - Max 3 pts     Screening Tests Health Maintenance  Topic Date Due  . MAMMOGRAM  07/11/2018  . COLONOSCOPY   07/31/2021  . TETANUS/TDAP  05/14/2026  . INFLUENZA VACCINE  Completed  . DEXA SCAN  Completed  . Hepatitis C Screening  Completed  . PNA vac Low Risk Adult  Completed       Plan:     I have personally reviewed, addressed, and noted the following in the patient's chart:  A. Medical and social history B. Use of alcohol, tobacco or illicit drugs  C. Current medications and supplements D. Functional ability and status E.  Nutritional status F.  Physical activity G. Advance directives H. List of other physicians I.  Hospitalizations, surgeries, and ER visits in previous 12 months J.  [place] to include hearing, vision, cognitive, depression L. Referrals and appointments - none  In addition, I have reviewed and discussed with patient certain preventive protocols, quality metrics, and best practice recommendations. A written personalized care plan for preventive services as well as general preventive health recommendations were provided to patient.  See attached scanned questionnaire for additional information.   Signed,   Lindell Noe, MHA, BS, LPN Health Coach

## 2018-05-23 NOTE — Patient Instructions (Signed)
Alisha Long , Thank you for taking time to come for your Medicare Wellness Visit. I appreciate your ongoing commitment to your health goals. Please review the following plan we discussed and let me know if I can assist you in the future.   These are the goals we discussed: Goals    . Increase physical activity     Starting 05/23/2018, I will continue to exercise for 60 minutes 3 days per week.        This is a list of the screening recommended for you and due dates:  Health Maintenance  Topic Date Due  . Mammogram  07/11/2018  . Colon Cancer Screening  07/31/2021  . Tetanus Vaccine  05/14/2026  . Flu Shot  Completed  . DEXA scan (bone density measurement)  Completed  .  Hepatitis C: One time screening is recommended by Center for Disease Control  (CDC) for  adults born from [birth year] through 1964.   Completed  . Pneumonia vaccines  Completed   Preventive Care for Adults  A healthy lifestyle and preventive care can promote health and wellness. Preventive health guidelines for adults include the following key practices.  . A routine yearly physical is a good way to check with your health care provider about your health and preventive screening. It is a chance to share any concerns and updates on your health and to receive a thorough exam.  . Visit your dentist for a routine exam and preventive care every 6 months. Brush your teeth twice a day and floss once a day. Good oral hygiene prevents tooth decay and gum disease.  . The frequency of eye exams is based on your age, health, family medical history, use  of contact lenses, and other factors. Follow your health care provider's recommendations for frequency of eye exams.  . Eat a healthy diet. Foods like vegetables, fruits, whole grains, low-fat dairy products, and lean protein foods contain the nutrients you need without too many calories. Decrease your intake of foods high in solid fats, added sugars, and salt. Eat the right amount of  calories for you. Get information about a proper diet from your health care provider, if necessary.  . Regular physical exercise is one of the most important things you can do for your health. Most adults should get at least 150 minutes of moderate-intensity exercise (any activity that increases your heart rate and causes you to sweat) each week. In addition, most adults need muscle-strengthening exercises on 2 or more days a week.  Silver Sneakers may be a benefit available to you. To determine eligibility, you may visit the website: www.silversneakers.com or contact program at 2258300780 Mon-Fri between 8AM-8PM.   . Maintain a healthy weight. The body mass index (BMI) is a screening tool to identify possible weight problems. It provides an estimate of body fat based on height and weight. Your health care provider can find your BMI and can help you achieve or maintain a healthy weight.   For adults 20 years and older: ? A BMI below 18.5 is considered underweight. ? A BMI of 18.5 to 24.9 is normal. ? A BMI of 25 to 29.9 is considered overweight. ? A BMI of 30 and above is considered obese.   . Maintain normal blood lipids and cholesterol levels by exercising and minimizing your intake of saturated fat. Eat a balanced diet with plenty of fruit and vegetables. Blood tests for lipids and cholesterol should begin at age 53 and be repeated every 5 years.  If your lipid or cholesterol levels are high, you are over 50, or you are at high risk for heart disease, you may need your cholesterol levels checked more frequently. Ongoing high lipid and cholesterol levels should be treated with medicines if diet and exercise are not working.  . If you smoke, find out from your health care provider how to quit. If you do not use tobacco, please do not start.  . If you choose to drink alcohol, please do not consume more than 2 drinks per day. One drink is considered to be 12 ounces (355 mL) of beer, 5 ounces  (148 mL) of wine, or 1.5 ounces (44 mL) of liquor.  . If you are 35-2 years old, ask your health care provider if you should take aspirin to prevent strokes.  . Use sunscreen. Apply sunscreen liberally and repeatedly throughout the day. You should seek shade when your shadow is shorter than you. Protect yourself by wearing long sleeves, pants, a wide-brimmed hat, and sunglasses year round, whenever you are outdoors.  . Once a month, do a whole body skin exam, using a mirror to look at the skin on your back. Tell your health care provider of new moles, moles that have irregular borders, moles that are larger than a pencil eraser, or moles that have changed in shape or color.

## 2018-05-23 NOTE — Progress Notes (Signed)
PCP notes:   Health maintenance:  Flu vaccine - administered PPSV23 - administered  Abnormal screenings:   None   Patient concerns:   None  Nurse concerns:  None  Next PCP appt:   05/26/18 @ 1015  I reviewed health advisor's note, was available for consultation, and agree with documentation and plan.Loura Pardon MD

## 2018-05-26 ENCOUNTER — Ambulatory Visit (INDEPENDENT_AMBULATORY_CARE_PROVIDER_SITE_OTHER): Payer: PPO | Admitting: Family Medicine

## 2018-05-26 ENCOUNTER — Encounter: Payer: Self-pay | Admitting: Family Medicine

## 2018-05-26 VITALS — BP 130/80 | HR 83 | Temp 98.6°F | Ht 64.5 in | Wt 162.8 lb

## 2018-05-26 DIAGNOSIS — L438 Other lichen planus: Secondary | ICD-10-CM

## 2018-05-26 DIAGNOSIS — E7800 Pure hypercholesterolemia, unspecified: Secondary | ICD-10-CM | POA: Diagnosis not present

## 2018-05-26 DIAGNOSIS — L439 Lichen planus, unspecified: Secondary | ICD-10-CM | POA: Insufficient documentation

## 2018-05-26 DIAGNOSIS — Z0000 Encounter for general adult medical examination without abnormal findings: Secondary | ICD-10-CM

## 2018-05-26 NOTE — Assessment & Plan Note (Signed)
Seen/eval by ENT

## 2018-05-26 NOTE — Progress Notes (Signed)
Subjective:    Patient ID: Alisha Long, female    DOB: 24-Jan-1951, 67 y.o.   MRN: [id number]  HPI Here for health maintenance exam and to review chronic medical problems    Has been feeling good overall   Wt Readings from Last 3 Encounters:  05/26/18 162 lb 12 oz (73.8 kg)  05/23/18 161 lb 8 oz (73.3 kg)  03/03/18 164 lb (74.4 kg)  stable wt  Taking care of herself -eating healthy  Goes to the gym / silver sneakers program and enjoys that  27.50 kg/m   Had amw on 9/27 Given flu and PNA 23 vaccine at that time   Mammogram 11/18 - will schedule herself /get a reminder soon Self breast exam -no lumps   Colonoscopy 12/17 with 5 y recall   dexa 11/16 normal No falls or fractures  She takes vit D daily 1000 iu   zostavax 9/13 Interested in shingrix vaccine when available    Hyperlipidemia Lab Results  Component Value Date   CHOL 234 (H) 05/23/2018   CHOL 243 (H) 05/13/2017   CHOL 210 (H) 05/14/2016   Lab Results  Component Value Date   HDL 53.70 05/23/2018   HDL 63.50 05/13/2017   HDL 50.50 05/14/2016   Lab Results  Component Value Date   LDLCALC 144 (H) 05/23/2018   [place] 128 (H) 08/17/2008   Lab Results  Component Value Date   TRIG 183.0 (H) 05/23/2018   TRIG 218.0 (H) 05/13/2017   TRIG 240.0 (H) 05/14/2016   Lab Results  Component Value Date   CHOLHDL 4 05/23/2018   CHOLHDL 4 05/13/2017   CHOLHDL 4 05/14/2016   Lab Results  Component Value Date   LDLDIRECT 144.0 05/13/2017   LDLDIRECT 135.0 05/14/2016   LDLDIRECT 162.0 05/04/2015   Intolerant of stains  LDL is stable  Trig down  She eats the best she can and exercises   Other labs  Results for orders placed or performed in visit on 05/23/18  TSH  Result Value Ref Range   TSH 2.59 0.35 - 4.50 uIU/mL  Lipid panel  Result Value Ref Range   Cholesterol 234 (H) 0 - 200 mg/dL   Triglycerides 183.0 (H) 0.0 - 149.0 mg/dL   HDL 53.70 >39.00 mg/dL   VLDL 36.6 0.0 - 40.0 mg/dL   LDL  Cholesterol 144 (H) 0 - 99 mg/dL   Total CHOL/HDL Ratio 4    NonHDL 180.23   Comprehensive metabolic panel  Result Value Ref Range   Sodium 142 135 - 145 mEq/L   Potassium 4.7 3.5 - 5.1 mEq/L   Chloride 106 96 - 112 mEq/L   CO2 31 19 - 32 mEq/L   Glucose, Bld 90 70 - 99 mg/dL   BUN 13 6 - 23 mg/dL   Creatinine, Ser 0.76 0.40 - 1.20 mg/dL   Total Bilirubin 0.5 0.2 - 1.2 mg/dL   Alkaline Phosphatase 115 39 - 117 U/L   AST 19 0 - 37 U/L   ALT 16 0 - 35 U/L   Total Protein 6.9 6.0 - 8.3 g/dL   Albumin 4.2 3.5 - 5.2 g/dL   Calcium 10.8 (H) 8.4 - 10.5 mg/dL   GFR 80.89 >60.00 mL/min  CBC with Differential/Platelet  Result Value Ref Range   WBC 6.5 4.0 - 10.5 K/uL   RBC 4.51 3.87 - 5.11 Mil/uL   Hemoglobin 14.6 12.0 - 15.0 g/dL   HCT 42.5 36.0 - 46.0 %   MCV 94.1  78.0 - 100.0 fl   MCHC 34.4 30.0 - 36.0 g/dL   RDW 13.0 11.5 - 15.5 %   Platelets 174.0 150.0 - 400.0 K/uL   Neutrophils Relative % 61.8 43.0 - 77.0 %   Lymphocytes Relative 28.4 12.0 - 46.0 %   Monocytes Relative 7.3 3.0 - 12.0 %   Eosinophils Relative 1.8 0.0 - 5.0 %   Basophils Relative 0.7 0.0 - 3.0 %   Neutro Abs 4.0 1.4 - 7.7 K/uL   Lymphs Abs 1.8 0.7 - 4.0 K/uL   Monocytes Absolute 0.5 0.1 - 1.0 K/uL   Eosinophils Absolute 0.1 0.0 - 0.7 K/uL   Basophils Absolute 0.0 0.0 - 0.1 K/uL     Uses replens twice weekly for vaginal dryness (also lubricants)  occ gets symptoms of uti ? (urgency) - feels like she does not always empty her bladder Urine does not have a change in odor  Takes cranberry supplement/occ uses azo  bp is up on first check  BP Readings from Last 3 Encounters:  05/26/18 (!) 144/78  05/23/18 126/80  03/03/18 128/80   Was treated for thrush by ENT Took diflucan and an anti fungal gel  Also lichen planus (watching)   Patient Active Problem List   Diagnosis Date Noted  . Lichen planus 55/97/4163  . 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 Welcome to Medicare preventive visit 05/21/2017  . Skin  tag 12/14/2016  . 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 Need for hepatitis C screening test 05/14/2016  . Estrogen deficiency 05/24/2015  . Anal skin tag 10/27/2012  . Encounter for routine gynecological examination 04/15/2012  . Routine general medical examination at a health care facility 04/07/2012  . HEADACHE 11/01/2010  . COLONIC POLYPS 01/06/2010  . POSTNASAL DRIP 01/06/2010  . IRON DEFICIENCY ANEMIA, HX OF 11/07/2009  . OTHER SEBORRHEIC KERATOSIS 07/19/2009  . SYMPTOM, INSOMNIA NOS 06/18/2007  . Hyperlipidemia 06/11/2007   Past Medical History:  Diagnosis Date  . Colon polyps   . History of shingles   . Hyperlipidemia   . Hypertension   . Insomnia   . Palpitations   . Post-nasal drip    with chronic cough   Past Surgical History:  Procedure Laterality Date  . ABDOMINAL HYSTERECTOMY     bleeding, ovaries intact  . BREAST EXCISIONAL BIOPSY Left   . CARPAL TUNNEL RELEASE  11/2004  . CESAREAN SECTION     x 3  . CHOLECYSTECTOMY  12/2005  . COLONOSCOPY     polyps  . ESOPHAGOGASTRODUODENOSCOPY     nml  . gallstones     Abd [place]   . KNEE SURGERY     10/2000  . KNEE SURGERY  01/2001   replaced ACL   Social History   Tobacco Use  . Smoking status: Former Research scientist (life sciences)  . Smokeless tobacco: Never Used  Substance Use Topics  . Alcohol use: Yes    Alcohol/week: 0.0 standard drinks    Comment: wine every once in a while  . Drug use: No   Family History  Problem Relation Age of Onset  . Seizures Father   . Hyperlipidemia Mother   . Aneurysm Maternal Grandmother   . Hyperlipidemia Maternal Grandmother   . Hypertension Maternal Grandmother   . Colon cancer Neg Hx   . Breast cancer Neg Hx    Allergies  Allergen Reactions  . Penicillins Shortness Of Breath    REACTION: hives  . Amoxicillin     REACTION: hives  . Atorvastatin  REACTION: joint pain  . Bactrim [Sulfamethoxazole-Trimethoprim] Hives  . Cephalexin     REACTION: hives  . Mometasone Furoate      REACTION: headache  . Rosuvastatin     REACTION: headache   Current Outpatient Medications on File Prior to Visit  Medication Sig Dispense Refill  . cholecalciferol (VITAMIN D) 1000 UNITS tablet Take 1,000 Units by mouth daily.      . cyclobenzaprine (FLEXERIL) 10 MG tablet Take 1 tablet (10 mg total) 3 (three) times daily as needed by mouth for muscle spasms. Caution of sedation 30 tablet 0  . fluocinonide gel (LIDEX) 0.05 % APPLY SPARINGLY TO THE AFFECTED AREA 3 TIMES DAILY UNTIL RESOLVED. NOTHING BY MOUTH FOR 30 MINS  1  . Multiple Vitamins-Minerals (MULTIVITAMIN ADULTS 50+ PO) Take 1 capsule by mouth daily.    . naproxen sodium (ANAPROX) 220 MG tablet Take 440 mg by mouth 2 (two) times daily as needed.    . Omega-3 Fatty Acids (FISH OIL PO) Take 1 capsule by mouth daily.    Marland Kitchen triamcinolone cream (KENALOG) 0.1 % Apply 1 application topically 2 (two) times daily. To affected areas 30 g 0  . valACYclovir (VALTREX) 1000 MG tablet TAKE 2 TABLETS BY MOUTH 2 TIMES DAILY ASNEEDED FOR COLD SORES FOR 1 DAY 12 tablet 5   No current facility-administered medications on file prior to visit.     Review of Systems  Constitutional: Negative for activity change, appetite change, fatigue, fever and unexpected weight change.  HENT: Negative for congestion, ear pain, rhinorrhea, sinus pressure and sore throat.   Eyes: Negative for pain, redness and visual disturbance.  Respiratory: Negative for cough, shortness of breath and wheezing.   Cardiovascular: Negative for chest pain and palpitations.  Gastrointestinal: Negative for abdominal pain, blood in stool, constipation and diarrhea.  Endocrine: Negative for polydipsia and polyuria.  Genitourinary: Negative for dysuria, frequency and urgency.       Has had a few utis that may be related to sexual activity   Musculoskeletal: Negative for arthralgias, back pain and myalgias.  Skin: Negative for pallor and rash.  Allergic/Immunologic: Negative for  environmental allergies.  Neurological: Negative for dizziness, syncope and headaches.  Hematological: Negative for adenopathy. Does not bruise/bleed easily.  Psychiatric/Behavioral: Negative for decreased concentration and dysphoric mood. The patient is not nervous/anxious.        Objective:   Physical Exam  Constitutional: She appears well-developed and well-nourished. No distress.  Well appearing   HENT:  Head: Normocephalic and atraumatic.  Right Ear: External ear normal.  Left Ear: External ear normal.  Mouth/Throat: Oropharynx is clear and moist.  Eyes: Pupils are equal, round, and reactive to light. Conjunctivae and EOM are normal. No scleral icterus.  Neck: Normal range of motion. Neck supple. No JVD present. Carotid bruit is not present. No thyromegaly present.  Cardiovascular: Normal rate, regular rhythm, normal heart sounds and intact distal pulses. Exam reveals no gallop.  Pulmonary/Chest: Effort normal and breath sounds normal. No respiratory distress. She has no wheezes. She exhibits no tenderness. No breast tenderness, discharge or bleeding.  Abdominal: Soft. Bowel sounds are normal. She exhibits no distension, no abdominal bruit and no mass. There is no tenderness.  Genitourinary: No breast tenderness, discharge or bleeding.  Genitourinary Comments: Breast exam: No mass, nodules, thickening, tenderness, bulging, retraction, inflamation, nipple discharge or skin changes noted.  No axillary or clavicular LA.      Musculoskeletal: Normal range of motion. She exhibits no edema, tenderness or deformity.  Lymphadenopathy:    She has no cervical adenopathy.  Neurological: She is alert. She has normal reflexes. She displays normal reflexes. No cranial nerve deficit. She exhibits normal muscle tone. Coordination normal.  Skin: Skin is warm and dry. No rash noted. No erythema. No pallor.  2-3 mm skin colored raised oval growth on L upper arm   Solar lentigines diffusely     Psychiatric: She has a normal mood and affect.  Pleasant           Assessment & Plan:   Problem List Items Addressed This Visit      Digestive   Oral lichen planus    Has improved Under care of ENT        Other   Hyperlipidemia    Disc goals for lipids and reasons to control them Rev last labs with pt Rev low sat fat diet in detail Stable from last check  Intolerant of statins Will continue to work on diet       Routine general medical examination at a health care facility - Primary    Reviewed health habits including diet and exercise and skin cancer prevention Reviewed appropriate screening tests for age  Also reviewed health mt list, fam hx and immunization status , as well as social and family history   See HPI amw reviewed  Labs reviewed  Pt will schedule mammogram for Nov Stable cholesterol (statin intol)  Disc shingrix vaccine

## 2018-05-26 NOTE — Assessment & Plan Note (Signed)
Has improved Under care of ENT

## 2018-05-26 NOTE — Assessment & Plan Note (Signed)
Reviewed health habits including diet and exercise and skin cancer prevention Reviewed appropriate screening tests for age  Also reviewed health mt list, fam hx and immunization status , as well as social and family history   See HPI amw reviewed  Labs reviewed  Pt will schedule mammogram for Nov Stable cholesterol (statin intol)  Disc shingrix vaccine

## 2018-05-26 NOTE — Assessment & Plan Note (Signed)
Disc goals for lipids and reasons to control them Rev last labs with pt Rev low sat fat diet in detail Stable from last check  Intolerant of statins Will continue to work on diet

## 2018-05-26 NOTE — Patient Instructions (Addendum)
If you are interested in the new shingles vaccine (Shingrix) - call your local pharmacy to check on coverage and availability  If interested - call pharmacy (check on coverage) and get on a wait list    Get a plain cranberry supplement and take it every day to prevent utis  Also drink lots of water- all the time  Be sure to urinate before and after sexual activity  Always wipe front to back

## 2018-05-29 ENCOUNTER — Other Ambulatory Visit: Payer: Self-pay | Admitting: Family Medicine

## 2018-05-29 DIAGNOSIS — Z1231 Encounter for screening mammogram for malignant neoplasm of breast: Secondary | ICD-10-CM

## 2018-06-16 IMAGING — MG DIGITAL SCREENING BILATERAL MAMMOGRAM WITH CAD
4 series · 4 of 4 positions shown · non-contrast
Comparison: Previous exam(s).

CLINICAL DATA: Screening.

EXAM:
DIGITAL SCREENING BILATERAL MAMMOGRAM WITH CAD

[R MLO]
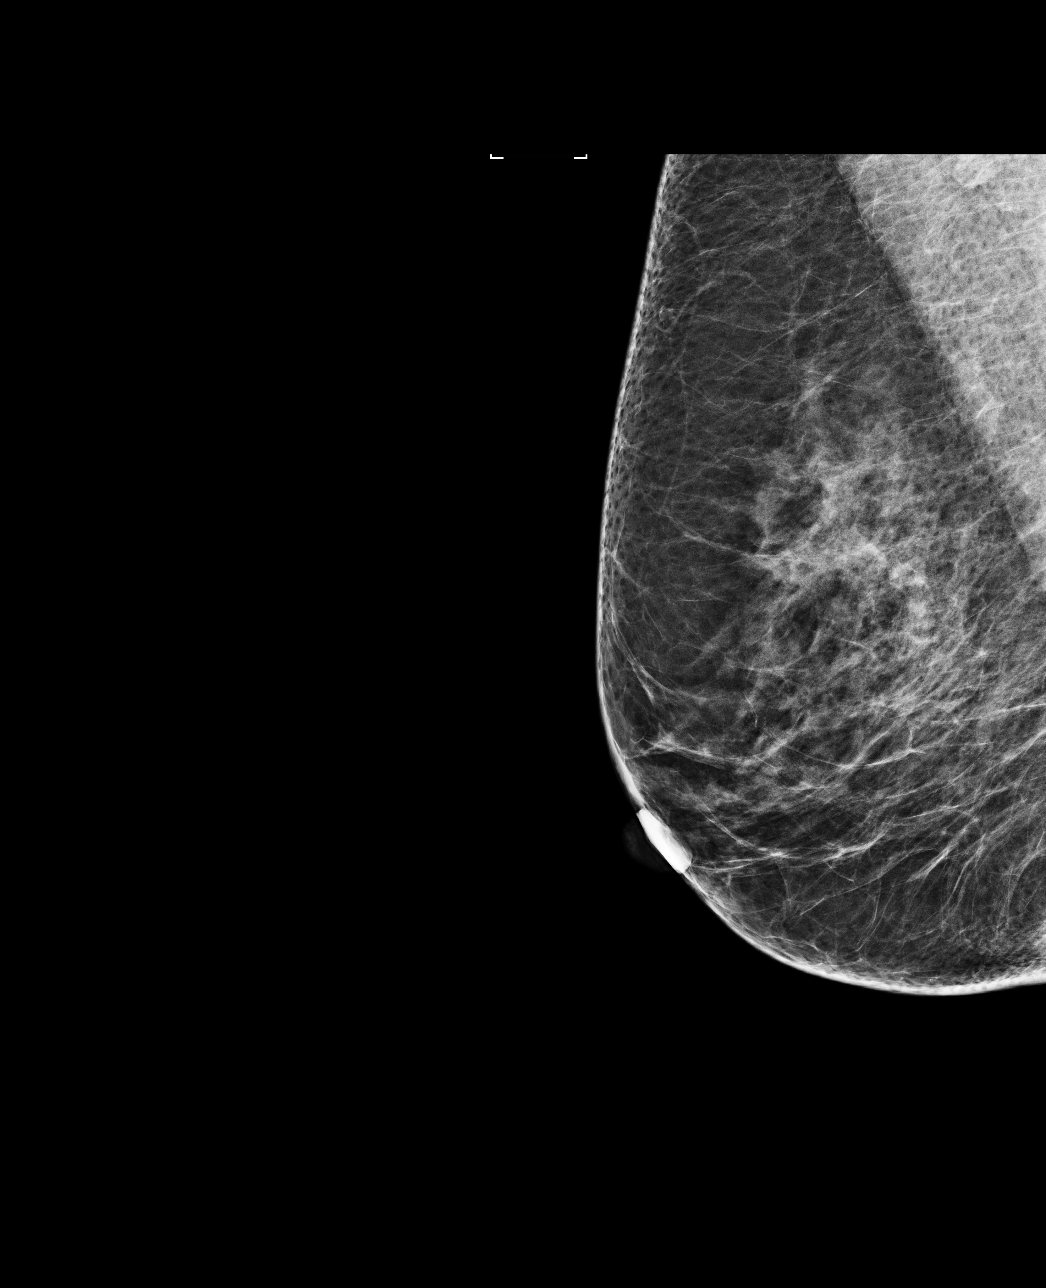

[L CC]
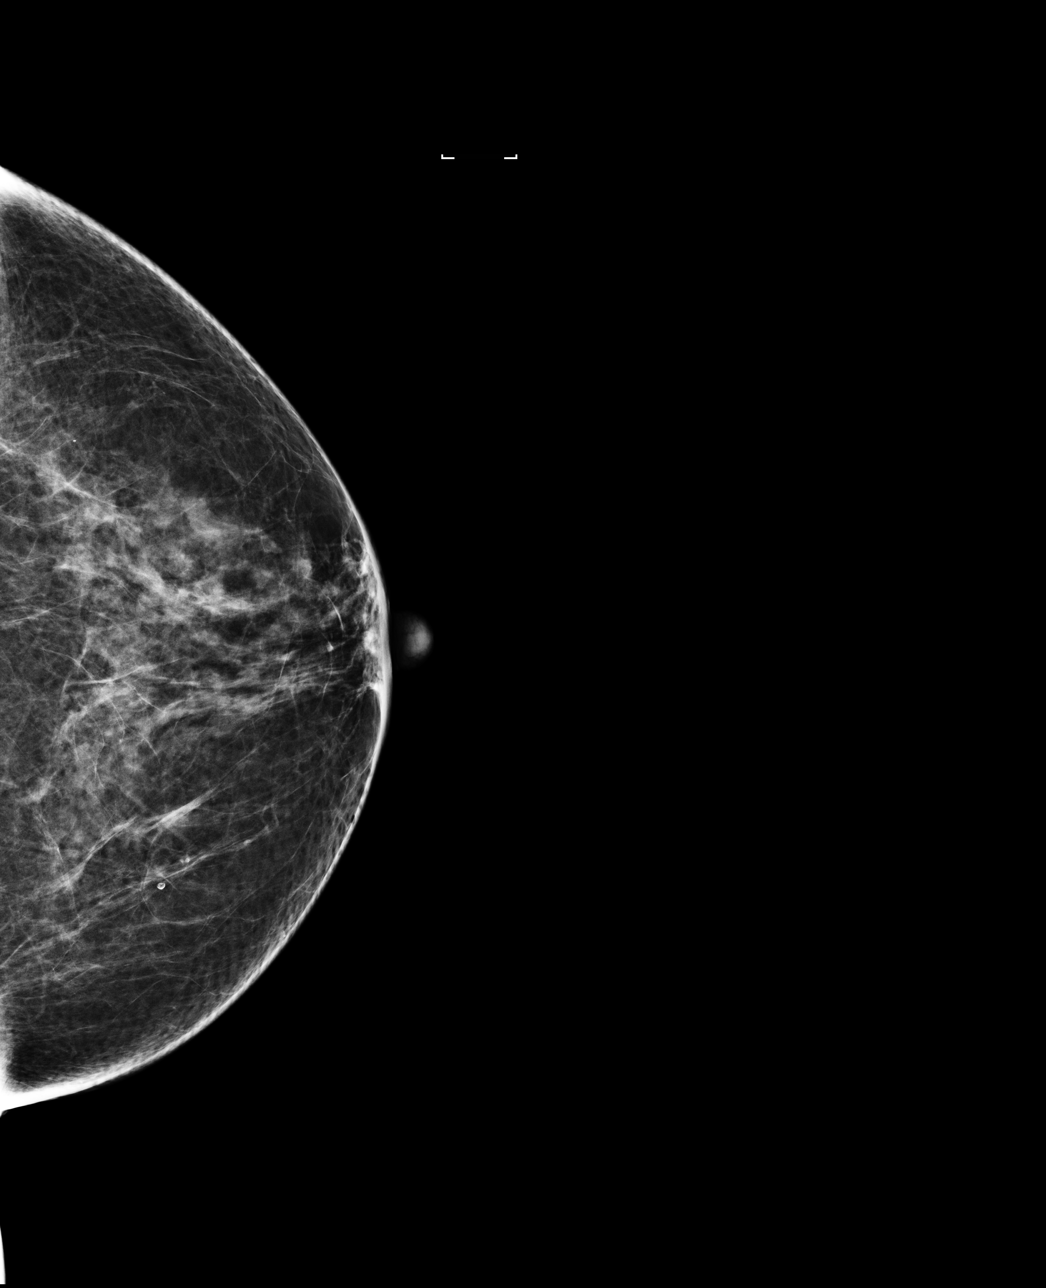

[R CC]
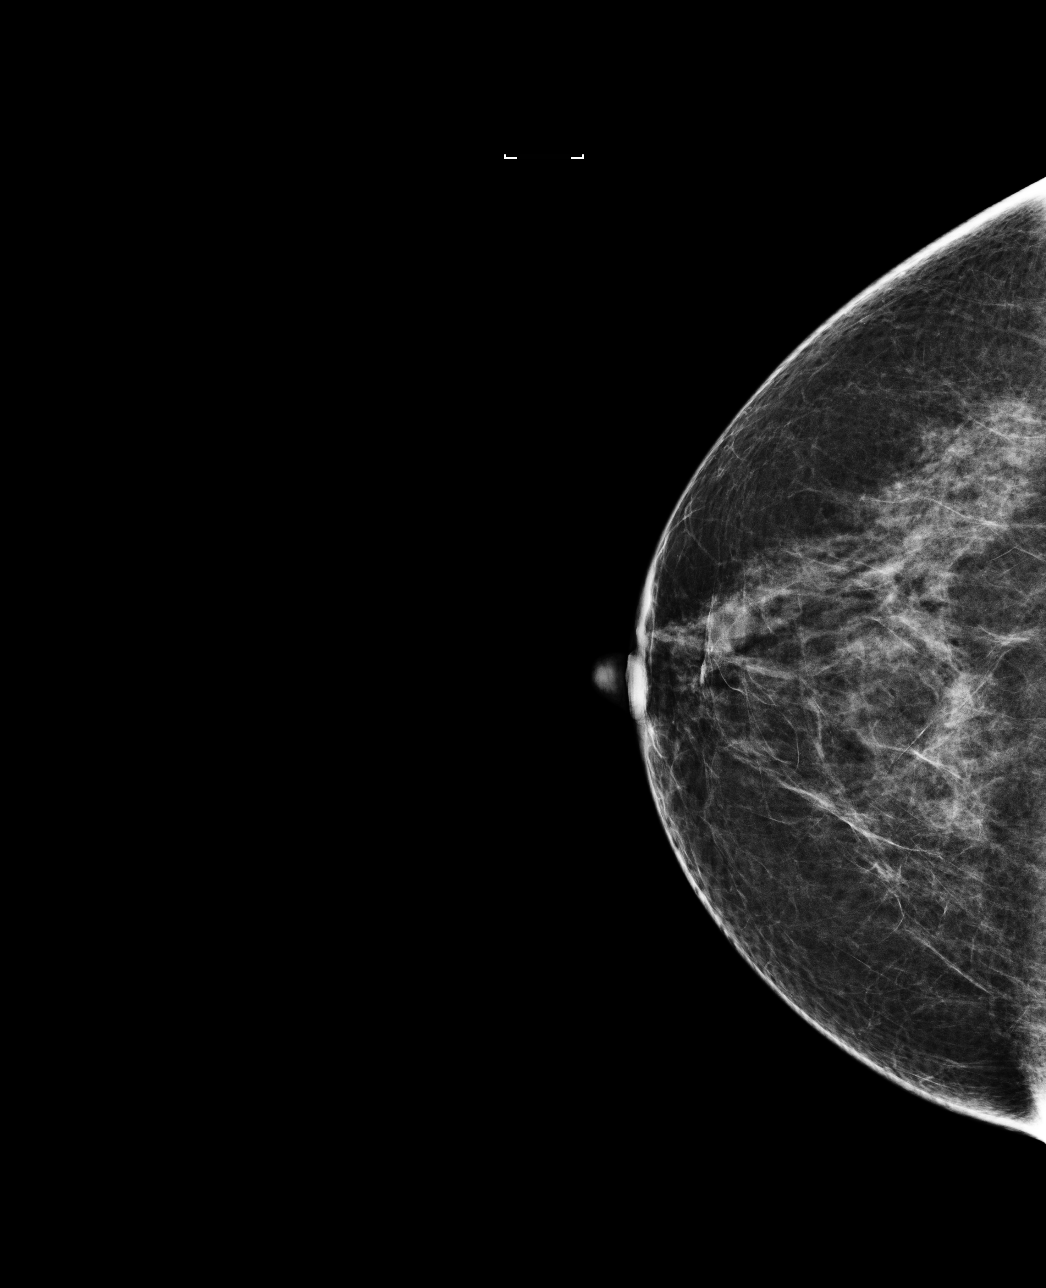

[L MLO]
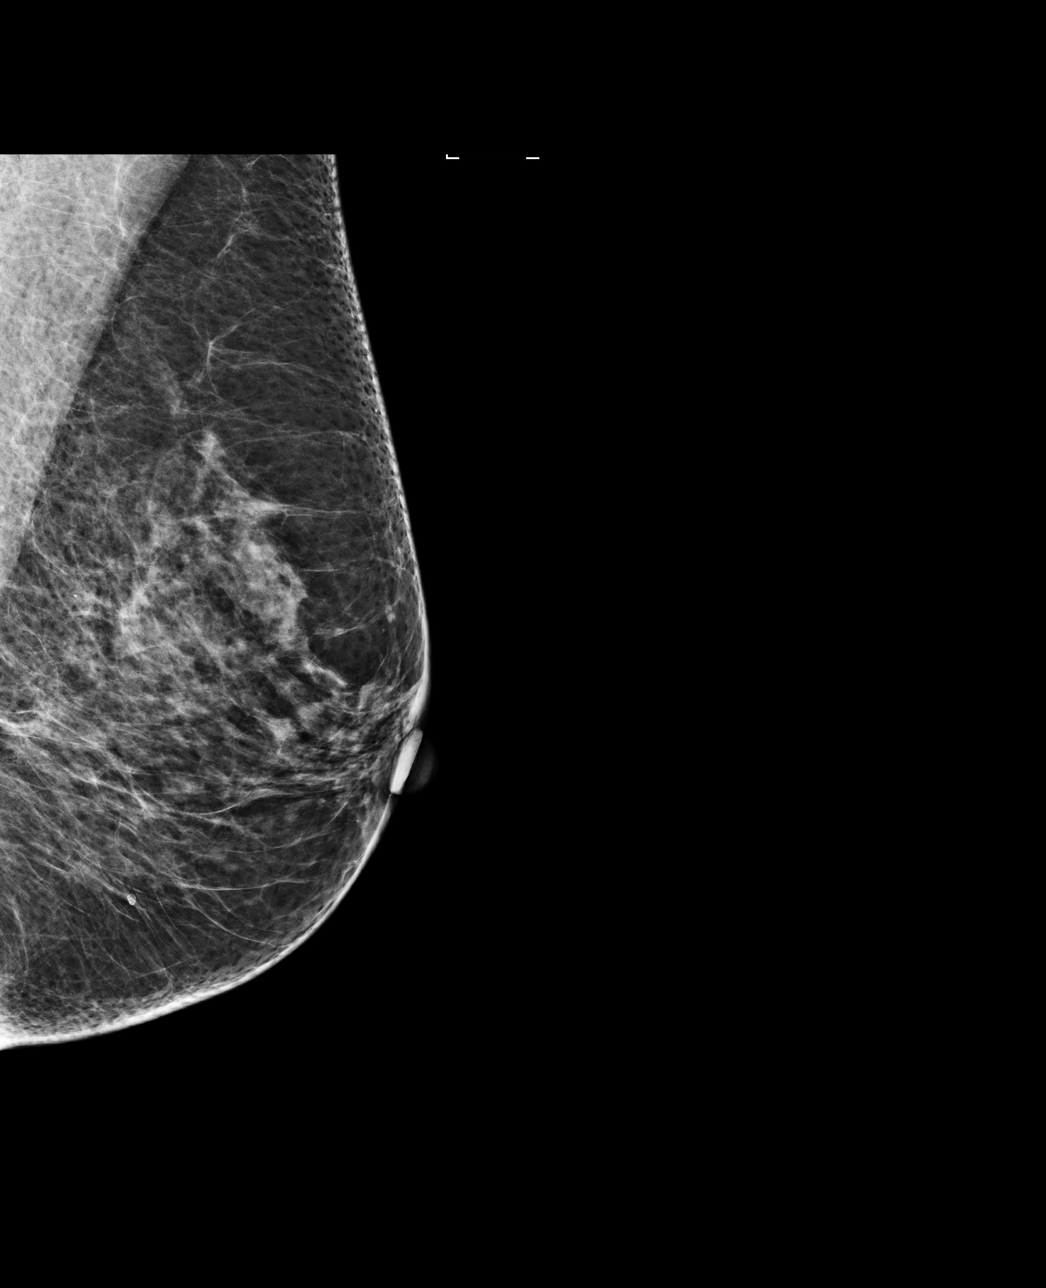

[4 of 4 positions shown; findings below may reference images not displayed]

ACR Breast Density Category b: There are scattered areas of
fibroglandular density.
FINDINGS: There are no findings suspicious for malignancy. Images were
processed with CAD.
IMPRESSION: No mammographic evidence of malignancy. A result letter of this
screening mammogram will be mailed directly to the patient.

RECOMMENDATION:
Screening mammogram in one year. (Code:AS-G-LCT)

BI-RADS CATEGORY  1: Negative.

## 2018-06-24 ENCOUNTER — Other Ambulatory Visit: Payer: Self-pay | Admitting: *Deleted

## 2018-06-24 MED ORDER — VALACYCLOVIR HCL 1 G PO TABS: ORAL_TABLET | ORAL | 1 refills | Status: DC

## 2018-06-24 NOTE — Telephone Encounter (Signed)
Last filled on 06/27/17 #12 tabs with 5 refills, CPE and AWV scheduled on 05/29/19, please advise

## 2018-07-18 ENCOUNTER — Ambulatory Visit
Admission: RE | Admit: 2018-07-18 | Discharge: 2018-07-18 | Disposition: A | Discharge status: Home / Self Care | Payer: PPO | Source: Ambulatory Visit | Attending: Family Medicine | Admitting: Family Medicine

## 2018-07-18 DIAGNOSIS — Z1231 Encounter for screening mammogram for malignant neoplasm of breast: Secondary | ICD-10-CM | POA: Diagnosis not present

## 2018-08-25 ENCOUNTER — Ambulatory Visit (INDEPENDENT_AMBULATORY_CARE_PROVIDER_SITE_OTHER): Payer: PPO | Admitting: Family Medicine

## 2018-08-25 ENCOUNTER — Encounter: Payer: Self-pay | Admitting: Family Medicine

## 2018-08-25 VITALS — BP 144/80 | HR 82 | Temp 98.7°F | Ht 64.5 in | Wt 166.5 lb

## 2018-08-25 DIAGNOSIS — L918 Other hypertrophic disorders of the skin: Secondary | ICD-10-CM | POA: Diagnosis not present

## 2018-08-25 DIAGNOSIS — L821 Other seborrheic keratosis: Secondary | ICD-10-CM

## 2018-08-25 NOTE — Assessment & Plan Note (Signed)
L axilla  3 mm flat/rough tan SK tx with liquid nitrogen cryo Disc aftercare Tolerated well

## 2018-08-25 NOTE — Assessment & Plan Note (Signed)
R upper arm 2-3 mm (cryo tx)  Ant neck 2 mm (cryo and clipped with scissors in sterile fashion)  Pt tolerated well  Disc aftercare Disc good skin care and use of sunscreen

## 2018-08-25 NOTE — Progress Notes (Signed)
Subjective:    Patient ID: Alisha Long, female    DOB: 1951-03-14, 67 y.o.   MRN: [id number]  HPI  Here for bothersome skin tags   L upper arm and L axillae and R neck  Bothersome/growng and abraded   None appear to be infected   Patient Active Problem List   Diagnosis Date Noted  . Lichen planus 50/35/4656  . Oral lichen planus 81/27/5170  . Welcome to Medicare preventive visit 05/21/2017  . Skin tag 12/14/2016  . Seborrheic keratosis 12/14/2016  . Need for hepatitis C screening test 05/14/2016  . Estrogen deficiency 05/24/2015  . Anal skin tag 10/27/2012  . Encounter for routine gynecological examination 04/15/2012  . Routine general medical examination at a health care facility 04/07/2012  . HEADACHE 11/01/2010  . COLONIC POLYPS 01/06/2010  . POSTNASAL DRIP 01/06/2010  . IRON DEFICIENCY ANEMIA, HX OF 11/07/2009  . OTHER SEBORRHEIC KERATOSIS 07/19/2009  . SYMPTOM, INSOMNIA NOS 06/18/2007  . Hyperlipidemia 06/11/2007   Past Medical History:  Diagnosis Date  . Colon polyps   . History of shingles   . Hyperlipidemia   . Hypertension   . Insomnia   . Palpitations   . Post-nasal drip    with chronic cough   Past Surgical History:  Procedure Laterality Date  . ABDOMINAL HYSTERECTOMY     bleeding, ovaries intact  . BREAST EXCISIONAL BIOPSY Left   . CARPAL TUNNEL RELEASE  11/2004  . CESAREAN SECTION     x 3  . CHOLECYSTECTOMY  12/2005  . COLONOSCOPY     polyps  . ESOPHAGOGASTRODUODENOSCOPY     nml  . gallstones     Abd [place]   . KNEE SURGERY     10/2000  . KNEE SURGERY  01/2001   replaced ACL   Social History   Tobacco Use  . Smoking status: Former Research scientist (life sciences)  . Smokeless tobacco: Never Used  Substance Use Topics  . Alcohol use: Yes    Alcohol/week: 0.0 standard drinks    Comment: wine every once in a while  . Drug use: No   Family History  Problem Relation Age of Onset  . Seizures Father   . Hyperlipidemia Mother   . Aneurysm Maternal  Grandmother   . Hyperlipidemia Maternal Grandmother   . Hypertension Maternal Grandmother   . Colon cancer Neg Hx   . Breast cancer Neg Hx    Allergies  Allergen Reactions  . Penicillins Shortness Of Breath    REACTION: hives  . Amoxicillin     REACTION: hives  . Atorvastatin     REACTION: joint pain  . Bactrim [Sulfamethoxazole-Trimethoprim] Hives  . Cephalexin     REACTION: hives  . Mometasone Furoate     REACTION: headache  . Rosuvastatin     REACTION: headache   Current Outpatient Medications on File Prior to Visit  Medication Sig Dispense Refill  . cholecalciferol (VITAMIN D) 1000 UNITS tablet Take 1,000 Units by mouth daily.      . cyclobenzaprine (FLEXERIL) 10 MG tablet Take 1 tablet (10 mg total) 3 (three) times daily as needed by mouth for muscle spasms. Caution of sedation 30 tablet 0  . fluocinonide gel (LIDEX) 0.05 % APPLY SPARINGLY TO THE AFFECTED AREA 3 TIMES DAILY UNTIL RESOLVED. NOTHING BY MOUTH FOR 30 MINS  1  . Multiple Vitamins-Minerals (MULTIVITAMIN ADULTS 50+ PO) Take 1 capsule by mouth daily.    . naproxen sodium (ANAPROX) 220 MG tablet Take 440 mg by mouth  2 (two) times daily as needed.    . Omega-3 Fatty Acids (FISH OIL PO) Take 1 capsule by mouth daily.    Marland Kitchen triamcinolone cream (KENALOG) 0.1 % Apply 1 application topically 2 (two) times daily. To affected areas 30 g 0  . valACYclovir (VALTREX) 1000 MG tablet TAKE 2 TABLETS BY MOUTH 2 TIMES DAILY ASNEEDED FOR COLD SORES FOR 1 DAY 12 tablet 1   No current facility-administered medications on file prior to visit.      Review of Systems  Constitutional: Negative for fatigue and fever.  Respiratory: Negative for shortness of breath.   Skin: Negative for color change, pallor, rash and wound.       Irritated skin tags   Hematological: Negative for adenopathy. Does not bruise/bleed easily.       Objective:   Physical Exam Constitutional:      General: She is not in acute distress.    Appearance:  Normal appearance. She is normal weight.  HENT:     Head: Normocephalic and atraumatic.  Eyes:     Conjunctiva/sclera: Conjunctivae normal.     Pupils: Pupils are equal, round, and reactive to light.  Neck:     Musculoskeletal: Neck supple.  Cardiovascular:     Rate and Rhythm: Normal rate and regular rhythm.  Pulmonary:     Effort: Pulmonary effort is normal. No respiratory distress.     Breath sounds: Normal breath sounds.  Lymphadenopathy:     Cervical: No cervical adenopathy.  Skin:    General: Skin is warm and dry.     Coloration: Skin is not pale.     Findings: No erythema or rash.     Comments: 2 mm abraded skin tag anterior neck /tan  Cryo tx times one and clipped in sterile fashion Silver nitrate for hemostasis and band aid  SK L axilla -tan and 3 mm/ flat tx with liq nitrogen cryo times 3   R upper arm - abraded skin tag 2-3 mm - tx with liquid nitrogen cryo times 3   Pt tolerated well   Solar aging and lentigines diffusely    Neurological:     Mental Status: She is alert.     Coordination: Coordination normal.  Psychiatric:        Mood and Affect: Mood normal.           Assessment & Plan:   Problem List Items Addressed This Visit      Musculoskeletal and Integument   Skin tag - Primary    R upper arm 2-3 mm (cryo tx)  Ant neck 2 mm (cryo and clipped with scissors in sterile fashion)  Pt tolerated well  Disc aftercare Disc good skin care and use of sunscreen       Seborrheic keratosis    L axilla  3 mm flat/rough tan SK tx with liquid nitrogen cryo Disc aftercare Tolerated well

## 2018-08-25 NOTE — Patient Instructions (Signed)
Keep skin procedure sites clean and dry  vaseline is ok to use  The spots will look irritated and blister at first - keep clean until healed If any problems let me know   Keep wearing sunscreen for good skin health

## 2019-02-03 DIAGNOSIS — H524 Presbyopia: Secondary | ICD-10-CM | POA: Diagnosis not present

## 2019-02-03 DIAGNOSIS — H52203 Unspecified astigmatism, bilateral: Secondary | ICD-10-CM | POA: Diagnosis not present

## 2019-03-18 ENCOUNTER — Other Ambulatory Visit: Payer: Self-pay | Admitting: *Deleted

## 2019-03-18 MED ORDER — VALACYCLOVIR HCL 1 G PO TABS: ORAL_TABLET | ORAL | 1 refills | Status: DC

## 2019-04-13 ENCOUNTER — Encounter: Payer: Self-pay | Admitting: Family Medicine

## 2019-04-17 ENCOUNTER — Ambulatory Visit (INDEPENDENT_AMBULATORY_CARE_PROVIDER_SITE_OTHER): Payer: PPO | Admitting: Family Medicine

## 2019-04-17 ENCOUNTER — Other Ambulatory Visit: Payer: Self-pay

## 2019-04-17 ENCOUNTER — Encounter: Payer: Self-pay | Admitting: Family Medicine

## 2019-04-17 DIAGNOSIS — R300 Dysuria: Secondary | ICD-10-CM

## 2019-04-17 LAB — POC URINALSYSI DIPSTICK (AUTOMATED)
Bilirubin, UA: NEGATIVE
Blood, UA: NEGATIVE
Glucose, UA: NEGATIVE
Ketones, UA: NEGATIVE
Leukocytes, UA: NEGATIVE
Nitrite, UA: NEGATIVE
Protein, UA: NEGATIVE
Spec Grav, UA: 1.02 (ref 1.010–1.025)
Urobilinogen, UA: 0.2 E.U./dL
pH, UA: 6 (ref 5.0–8.0)

## 2019-04-17 MED ORDER — NITROFURANTOIN MONOHYD MACRO 100 MG PO CAPS: 100.0000 mg | ORAL_CAPSULE | Freq: Two times a day (BID) | ORAL | 0 refills | Status: DC

## 2019-04-17 NOTE — Progress Notes (Signed)
Subjective:    Patient ID: Alisha Long, female    DOB: 1951/05/31, 68 y.o.   MRN: [id number]  HPI  Here for urinary symptoms   Thinks she got a uti about a month ago  Drank cranberry juice and water Used azo for symptoms Got better   Came back a week later -but more mild Did same thing and got better  Now for the past week she has frequent urination/urgency Feels like her bladder is not emptying  No fever  No blood in urine  No nausea or dec appetire   Burning to urinate was brief and mild  (so did not use azo) No chills  Still drinking cranberry juice and lots of fluids   Uses replens several times per week  No vaginal itch or discomfort   Nocturia times 2  Has had a hysterectomy  No bladder tack   No h/o overactive bladder Avoids caffeine  No incontinence     ua today: is completely clear   Rev with pt  Patient Active Problem List   Diagnosis Date Noted  . Dysuria 04/17/2019  . Lichen planus [id number]  . 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 Welcome to Medicare preventive visit 05/21/2017  . Skin tag 12/14/2016  . Seborrheic keratosis 12/14/2016  . Need for hepatitis C screening test 05/14/2016  . Estrogen deficiency 05/24/2015  . Encounter for routine gynecological examination 04/15/2012  . Routine general medical examination at a health care facility 04/07/2012  . HEADACHE 11/01/2010  . COLONIC POLYPS 01/06/2010  . POSTNASAL DRIP 01/06/2010  . IRON DEFICIENCY ANEMIA, HX OF 11/07/2009  . OTHER SEBORRHEIC KERATOSIS 07/19/2009  . SYMPTOM, INSOMNIA NOS 06/18/2007  . Hyperlipidemia 06/11/2007   Past Medical History:  Diagnosis Date  . Colon polyps   . History of shingles   . Hyperlipidemia   . Hypertension   . Insomnia   . Palpitations   . Post-nasal drip    with chronic cough   Past Surgical History:  Procedure Laterality Date  . ABDOMINAL HYSTERECTOMY     bleeding, ovaries intact  . BREAST EXCISIONAL BIOPSY Left   . CARPAL TUNNEL  RELEASE  11/2004  . CESAREAN SECTION     x 3  . CHOLECYSTECTOMY  12/2005  . COLONOSCOPY     polyps  . ESOPHAGOGASTRODUODENOSCOPY     nml  . gallstones     Abd [place]   . KNEE SURGERY     10/2000  . KNEE SURGERY  01/2001   replaced ACL   Social History   Tobacco Use  . Smoking status: Former Research scientist (life sciences)  . Smokeless tobacco: Never Used  Substance Use Topics  . Alcohol use: Yes    Alcohol/week: 0.0 standard drinks    Comment: wine every once in a while  . Drug use: No   Family History  Problem Relation Age of Onset  . Seizures Father   . Hyperlipidemia Mother   . Aneurysm Maternal Grandmother   . Hyperlipidemia Maternal Grandmother   . Hypertension Maternal Grandmother   . Colon cancer Neg Hx   . Breast cancer Neg Hx    Allergies  Allergen Reactions  . Penicillins Shortness Of Breath    REACTION: hives  . Amoxicillin     REACTION: hives  . Atorvastatin     REACTION: joint pain  . Bactrim [Sulfamethoxazole-Trimethoprim] Hives  . Cephalexin     REACTION: hives  . Mometasone Furoate     REACTION: headache  . Rosuvastatin  REACTION: headache   Current Outpatient Medications on File Prior to Visit  Medication Sig Dispense Refill  . cholecalciferol (VITAMIN D) 1000 UNITS tablet Take 1,000 Units by mouth daily.      . cyclobenzaprine (FLEXERIL) 10 MG tablet Take 1 tablet (10 mg total) 3 (three) times daily as needed by mouth for muscle spasms. Caution of sedation 30 tablet 0  . fluocinonide gel (LIDEX) 0.05 % APPLY SPARINGLY TO THE AFFECTED AREA 3 TIMES DAILY UNTIL RESOLVED. NOTHING BY MOUTH FOR 30 MINS  1  . Multiple Vitamins-Minerals (MULTIVITAMIN ADULTS 50+ PO) Take 1 capsule by mouth daily.    . naproxen sodium (ANAPROX) 220 MG tablet Take 440 mg by mouth 2 (two) times daily as needed.    . Omega-3 Fatty Acids (FISH OIL PO) Take 1 capsule by mouth daily.    Marland Kitchen triamcinolone cream (KENALOG) 0.1 % Apply 1 application topically 2 (two) times daily. To affected areas 30 g  0  . valACYclovir (VALTREX) 1000 MG tablet TAKE 2 TABLETS BY MOUTH 2 TIMES DAILY ASNEEDED FOR COLD SORES FOR 1 DAY 12 tablet 1   No current facility-administered medications on file prior to visit.     Review of Systems  Constitutional: Negative for activity change, appetite change, fatigue, fever and unexpected weight change.  HENT: Negative for congestion, ear pain, rhinorrhea, sinus pressure and sore throat.   Eyes: Negative for pain, redness and visual disturbance.  Respiratory: Negative for cough, shortness of breath and wheezing.   Cardiovascular: Negative for chest pain and palpitations.  Gastrointestinal: Negative for abdominal pain, blood in stool, constipation and diarrhea.  Endocrine: Negative for polydipsia and polyuria.  Genitourinary: Positive for dysuria, frequency and urgency. Negative for decreased urine volume, hematuria, pelvic pain and vaginal pain.  Musculoskeletal: Negative for arthralgias, back pain and myalgias.  Skin: Negative for pallor and rash.  Allergic/Immunologic: Negative for environmental allergies.  Neurological: Negative for dizziness, syncope and headaches.  Hematological: Negative for adenopathy. Does not bruise/bleed easily.  Psychiatric/Behavioral: Negative for decreased concentration and dysphoric mood. The patient is not nervous/anxious.        Objective:   Physical Exam Constitutional:      General: She is not in acute distress.    Appearance: Normal appearance. She is well-developed and normal weight. She is not ill-appearing or diaphoretic.  HENT:     Head: Normocephalic and atraumatic.  Eyes:     Conjunctiva/sclera: Conjunctivae normal.     Pupils: Pupils are equal, round, and reactive to light.  Neck:     Musculoskeletal: Normal range of motion and neck supple.  Cardiovascular:     Rate and Rhythm: Normal rate and regular rhythm.     Heart sounds: Normal heart sounds.  Pulmonary:     Effort: Pulmonary effort is normal.     Breath  sounds: Normal breath sounds.  Abdominal:     General: Bowel sounds are normal. There is no distension.     Palpations: Abdomen is soft.     Tenderness: There is abdominal tenderness. There is no rebound.     Comments: No cva tenderness  No suprapubic tenderness or fullness (it is uncomfortable)  Lymphadenopathy:     Cervical: No cervical adenopathy.  Skin:    Findings: No rash.  Neurological:     Mental Status: She is alert.  Psychiatric:        Mood and Affect: Mood normal.           Assessment & Plan:  Problem List Items Addressed This Visit      Other   Dysuria    Clear ua but will cx and tx with macrobid on spec  She may be wiping small infections out with fluids and cranberry Enc fluids  Alert if symptoms worsen  Enc her to avoid other beverages besides water  Also avoid baths or detergents around urethra       Relevant Orders   Urine Culture (Completed)   POCT Urinalysis Dipstick (Automated) (Completed)

## 2019-04-17 NOTE — Patient Instructions (Addendum)
Keep drinking water  Stop cranberry  For now - just drink water  Even watch spicy foods   UA is clear but I want to do a culture Take the generic macrobid and we will update you when culture result comes in   If symptoms suddenly worsen please call

## 2019-04-18 LAB — URINE CULTURE
MICRO NUMBER:: 798066
Result:: NO GROWTH
SPECIMEN QUALITY:: ADEQUATE

## 2019-04-19 NOTE — Assessment & Plan Note (Signed)
Clear ua but will cx and tx with macrobid on spec  She may be wiping small infections out with fluids and cranberry Enc fluids  Alert if symptoms worsen  Enc her to avoid other beverages besides water  Also avoid baths or detergents around urethra

## 2019-05-19 DIAGNOSIS — Z85828 Personal history of other malignant neoplasm of skin: Secondary | ICD-10-CM | POA: Diagnosis not present

## 2019-05-19 DIAGNOSIS — B353 Tinea pedis: Secondary | ICD-10-CM | POA: Diagnosis not present

## 2019-05-19 DIAGNOSIS — D225 Melanocytic nevi of trunk: Secondary | ICD-10-CM | POA: Diagnosis not present

## 2019-05-19 DIAGNOSIS — L718 Other rosacea: Secondary | ICD-10-CM | POA: Diagnosis not present

## 2019-05-19 DIAGNOSIS — L820 Inflamed seborrheic keratosis: Secondary | ICD-10-CM | POA: Diagnosis not present

## 2019-05-19 DIAGNOSIS — L821 Other seborrheic keratosis: Secondary | ICD-10-CM | POA: Diagnosis not present

## 2019-05-19 DIAGNOSIS — L570 Actinic keratosis: Secondary | ICD-10-CM | POA: Diagnosis not present

## 2019-05-19 DIAGNOSIS — L814 Other melanin hyperpigmentation: Secondary | ICD-10-CM | POA: Diagnosis not present

## 2019-05-24 ENCOUNTER — Telehealth: Payer: Self-pay | Admitting: Family Medicine

## 2019-05-24 DIAGNOSIS — E7800 Pure hypercholesterolemia, unspecified: Secondary | ICD-10-CM

## 2019-05-24 DIAGNOSIS — Z0000 Encounter for general adult medical examination without abnormal findings: Secondary | ICD-10-CM

## 2019-05-24 NOTE — Telephone Encounter (Signed)
-----   Message from Ellamae Sia sent at 05/18/2019  3:32 PM EDT ----- Regarding: Lab orders for Monday, 9.28.20 Patient is scheduled for CPX labs, please order future labs, Thanks , Karna Christmas

## 2019-05-25 ENCOUNTER — Other Ambulatory Visit (INDEPENDENT_AMBULATORY_CARE_PROVIDER_SITE_OTHER): Payer: PPO

## 2019-05-25 ENCOUNTER — Other Ambulatory Visit: Payer: Self-pay

## 2019-05-25 DIAGNOSIS — E7800 Pure hypercholesterolemia, unspecified: Secondary | ICD-10-CM | POA: Diagnosis not present

## 2019-05-25 DIAGNOSIS — Z0000 Encounter for general adult medical examination without abnormal findings: Secondary | ICD-10-CM

## 2019-05-25 LAB — COMPREHENSIVE METABOLIC PANEL
ALT: 21 U/L (ref 0–35)
AST: 20 U/L (ref 0–37)
Albumin: 4.2 g/dL (ref 3.5–5.2)
Alkaline Phosphatase: 119 U/L — ABNORMAL HIGH (ref 39–117)
BUN: 14 mg/dL (ref 6–23)
CO2: 29 mEq/L (ref 19–32)
Calcium: 10.5 mg/dL (ref 8.4–10.5)
Chloride: 105 mEq/L (ref 96–112)
Creatinine, Ser: 0.78 mg/dL (ref 0.40–1.20)
GFR: 73.64 mL/min (ref >60.00)
Glucose, Bld: 132 mg/dL — ABNORMAL HIGH (ref 70–99)
Potassium: 3.8 mEq/L (ref 3.5–5.1)
Sodium: 142 mEq/L (ref 135–145)
Total Bilirubin: 0.5 mg/dL (ref 0.2–1.2)
Total Protein: 6.4 g/dL (ref 6.0–8.3)

## 2019-05-25 LAB — CBC WITH DIFFERENTIAL/PLATELET
Basophils Absolute: 0.1 10*3/uL (ref 0.0–0.1)
Basophils Relative: 0.9 % (ref 0.0–3.0)
Eosinophils Absolute: 0.1 10*3/uL (ref 0.0–0.7)
Eosinophils Relative: 2.1 % (ref 0.0–5.0)
HCT: 42.6 % (ref 36.0–46.0)
Hemoglobin: 14.2 g/dL (ref 12.0–15.0)
Lymphocytes Relative: 29.2 % (ref 12.0–46.0)
Lymphs Abs: 2.1 10*3/uL (ref 0.7–4.0)
MCHC: 33.3 g/dL (ref 30.0–36.0)
MCV: 95.9 fl (ref 78.0–100.0)
Monocytes Absolute: 0.4 10*3/uL (ref 0.1–1.0)
Monocytes Relative: 5.7 % (ref 3.0–12.0)
Neutro Abs: 4.5 10*3/uL (ref 1.4–7.7)
Neutrophils Relative %: 62.1 % (ref 43.0–77.0)
Platelets: 164 10*3/uL (ref 150.0–400.0)
RBC: 4.44 Mil/uL (ref 3.87–5.11)
RDW: 13.3 % (ref 11.5–15.5)
WBC: 7.2 10*3/uL (ref 4.0–10.5)

## 2019-05-25 LAB — LIPID PANEL
Cholesterol: 234 mg/dL — ABNORMAL HIGH (ref 0–200)
HDL: 57.2 mg/dL (ref >39.00)
NonHDL: 176.51
Total CHOL/HDL Ratio: 4
Triglycerides: 238 mg/dL — ABNORMAL HIGH (ref 0.0–149.0)
VLDL: 47.6 mg/dL — ABNORMAL HIGH (ref 0.0–40.0)

## 2019-05-25 LAB — LDL CHOLESTEROL, DIRECT: Direct LDL: 154 mg/dL

## 2019-05-25 LAB — TSH: TSH: 3.82 u[IU]/mL (ref 0.35–4.50)

## 2019-05-28 ENCOUNTER — Encounter: Payer: PPO | Admitting: Family Medicine

## 2019-05-29 ENCOUNTER — Ambulatory Visit: Payer: PPO

## 2019-05-29 ENCOUNTER — Encounter: Payer: PPO | Admitting: Family Medicine

## 2019-06-08 ENCOUNTER — Other Ambulatory Visit: Payer: Self-pay | Admitting: Family Medicine

## 2019-06-08 DIAGNOSIS — Z1231 Encounter for screening mammogram for malignant neoplasm of breast: Secondary | ICD-10-CM

## 2019-07-05 ENCOUNTER — Other Ambulatory Visit: Payer: Self-pay | Admitting: Family Medicine

## 2019-07-06 ENCOUNTER — Other Ambulatory Visit: Payer: Self-pay

## 2019-07-06 ENCOUNTER — Other Ambulatory Visit (HOSPITAL_COMMUNITY)
Admission: RE | Admit: 2019-07-06 | Discharge: 2019-07-06 | Disposition: A | Discharge status: Home / Self Care | Payer: PPO | Source: Ambulatory Visit | Attending: Family Medicine | Admitting: Family Medicine

## 2019-07-06 ENCOUNTER — Ambulatory Visit (INDEPENDENT_AMBULATORY_CARE_PROVIDER_SITE_OTHER): Payer: PPO | Admitting: Family Medicine

## 2019-07-06 ENCOUNTER — Encounter: Payer: Self-pay | Admitting: Family Medicine

## 2019-07-06 VITALS — BP 125/80 | HR 72 | Temp 97.1°F | Ht 64.25 in | Wt 169.2 lb

## 2019-07-06 DIAGNOSIS — Z01419 Encounter for gynecological examination (general) (routine) without abnormal findings: Secondary | ICD-10-CM | POA: Insufficient documentation

## 2019-07-06 DIAGNOSIS — E7800 Pure hypercholesterolemia, unspecified: Secondary | ICD-10-CM | POA: Diagnosis not present

## 2019-07-06 DIAGNOSIS — Z0000 Encounter for general adult medical examination without abnormal findings: Secondary | ICD-10-CM | POA: Diagnosis not present

## 2019-07-06 DIAGNOSIS — Z23 Encounter for immunization: Secondary | ICD-10-CM | POA: Diagnosis not present

## 2019-07-06 DIAGNOSIS — R7309 Other abnormal glucose: Secondary | ICD-10-CM | POA: Diagnosis not present

## 2019-07-06 DIAGNOSIS — R7303 Prediabetes: Secondary | ICD-10-CM | POA: Insufficient documentation

## 2019-07-06 MED ORDER — VALACYCLOVIR HCL 1 G PO TABS: ORAL_TABLET | ORAL | 1 refills | Status: DC

## 2019-07-06 MED ORDER — ROSUVASTATIN CALCIUM 5 MG PO TABS: 5.0000 mg | ORAL_TABLET | ORAL | 11 refills | Status: DC

## 2019-07-06 NOTE — Progress Notes (Signed)
Subjective:    Patient ID: Alisha Long, female    DOB: [birth year], 68 y.o.   MRN: [id number]  HPI Here for amw and health mt exam with rev of chronic problems   I have personally reviewed the Medicare Annual Wellness questionnaire and have noted 1. The patient's medical and social history 2. Their use of alcohol, tobacco or illicit drugs 3. Their current medications and supplements 4. The patient's functional ability including ADL's, fall risks, home safety risks and hearing or visual             impairment. 5. Diet and physical activities 6. Evidence for depression or mood disorders  The patients weight, height, BMI have been recorded in the chart and visual acuity is per eye clinic.  I have made referrals, counseling and provided education to the patient based review of the above and I have provided the pt with a written personalized care plan for preventive services. Reviewed and updated provider list, see scanned forms.  See scanned forms.  Routine anticipatory guidance given to patient.  See health maintenance. Colon cancer screening 12/17 -- 5 y recall for ademoma  Breast cancer screening mammogram 11/19 has one scheduled later this mo  Self breast exam-no lumps  Flu vaccine=given today  Wants a pap this year  Has had a hysterectomy  No gyn problems   Tetanus vaccine Tdap 9/17  Pneumovax completed Zoster vaccine 9/13 zostavax  Dexa 11/16 -normal range  Falls-none  Fractures-non  Supplements- takes mvi and also 1000 iu D daily  Exercise - not as much as in the past  Went to the gym - her instructor left  Is walking at home and [facility] some videos through [facility]   Advance directive-has living will and poa  Keeps a 67 year old  Cognitive function addressed- see scanned forms- and if abnormal then additional documentation follows.   PMH and SH reviewed  Meds, vitals, and allergies reviewed.   ROS: See HPI.  Otherwise negative.    Weight : Wt Readings from Last  3 Encounters:  07/06/19 169 lb 3 oz (76.7 kg)  04/17/19 168 lb (76.2 kg)  08/25/18 166 lb 8 oz (75.5 kg)   28.82 kg/m   Hearing/vision:  Hearing Screening   125Hz  250Hz  500Hz  1000Hz  2000Hz  3000Hz  4000Hz  6000Hz  8000Hz   Right ear:   40 40 40  40    Left ear:   40 40 40  40    Vision Screening Comments: Pt had eye exam June 2020 with Dr. Matthew Saras no vision changes or concerns  Needs reading glasses   BP Readings from Last 3 Encounters:  07/06/19 (!) 146/84  04/17/19 140/84  08/25/18 (!) 144/80  at home 130s/70s in afternoon    Pulse Readings from Last 3 Encounters:  07/06/19 72  04/17/19 67  08/25/18 82    Hyperlipidemia Lab Results  Component Value Date   CHOL 234 (H) 05/25/2019   CHOL 234 (H) 05/23/2018   CHOL 243 (H) 05/13/2017   Lab Results  Component Value Date   HDL 57.20 05/25/2019   HDL 53.70 05/23/2018   HDL 63.50 05/13/2017   Lab Results  Component Value Date   LDLCALC 144 (H) 05/23/2018   LDLCALC 128 (H) 08/17/2008   Lab Results  Component Value Date   TRIG 238.0 (H) 05/25/2019   TRIG 183.0 (H) 05/23/2018   TRIG 218.0 (H) 05/13/2017   Lab Results  Component Value Date   CHOLHDL 4 05/25/2019  CHOLHDL 4 05/23/2018   CHOLHDL 4 05/13/2017   Lab Results  Component Value Date   LDLDIRECT 154.0 05/25/2019   LDLDIRECT 144.0 05/13/2017   LDLDIRECT 135.0 05/14/2016   intol to statins  Has not been exercising quite the same  Diet is good  Avoids fried food  Has tried a statin -low dose-severe joint pain  Is interested in crestor twice weeks   Not many desserts   Checks glucose fasting  Fasting low 90s average  Was 132 fasting for [place]   Lab Results  Component Value Date   CREATININE 0.78 05/25/2019   BUN 14 05/25/2019   NA 142 05/25/2019   K 3.8 05/25/2019   CL 105 05/25/2019   CO2 29 05/25/2019   Lab Results  Component Value Date   ALT 21 05/25/2019   AST 20 05/25/2019   ALKPHOS 119 (H) 05/25/2019   BILITOT 0.5 05/25/2019   Lab  Results  Component Value Date   WBC 7.2 05/25/2019   HGB 14.2 05/25/2019   HCT 42.6 05/25/2019   MCV 95.9 05/25/2019   PLT 164.0 05/25/2019    Lab Results  Component Value Date   TSH 3.82 05/25/2019     Patient Active Problem List   Diagnosis Date Noted  . Medicare annual wellness visit, initial 07/06/2019  . Elevated glucose 07/06/2019  . Lichen planus [id number]  . 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 Welcome to Medicare preventive visit 05/21/2017  . Seborrheic keratosis 12/14/2016  . Need for hepatitis C screening test 05/14/2016  . Estrogen deficiency 05/24/2015  . Encounter for routine gynecological examination 04/15/2012  . Well woman exam with routine gynecological exam 04/07/2012  . HEADACHE 11/01/2010  . COLONIC POLYPS 01/06/2010  . POSTNASAL DRIP 01/06/2010  . IRON DEFICIENCY ANEMIA, HX OF 11/07/2009  . OTHER SEBORRHEIC KERATOSIS 07/19/2009  . SYMPTOM, INSOMNIA NOS 06/18/2007  . Hyperlipidemia 06/11/2007   Past Medical History:  Diagnosis Date  . Colon polyps   . History of shingles   . Hyperlipidemia   . Hypertension   . Insomnia   . Palpitations   . Post-nasal drip    with chronic cough   Past Surgical History:  Procedure Laterality Date  . ABDOMINAL HYSTERECTOMY     bleeding, ovaries intact  . BREAST EXCISIONAL BIOPSY Left   . CARPAL TUNNEL RELEASE  11/2004  . CESAREAN SECTION     x 3  . CHOLECYSTECTOMY  12/2005  . COLONOSCOPY     polyps  . ESOPHAGOGASTRODUODENOSCOPY     nml  . gallstones     Abd [place]   . KNEE SURGERY     10/2000  . KNEE SURGERY  01/2001   replaced ACL   Social History   Tobacco Use  . Smoking status: Former Research scientist (life sciences)  . Smokeless tobacco: Never Used  Substance Use Topics  . Alcohol use: Yes    Alcohol/week: 0.0 standard drinks    Comment: wine every once in a while  . Drug use: No   Family History  Problem Relation Age of Onset  . Seizures Father   . Hyperlipidemia Mother   . Aneurysm Maternal Grandmother   .  Hyperlipidemia Maternal Grandmother   . Hypertension Maternal Grandmother   . Colon cancer Neg Hx   . Breast cancer Neg Hx    Allergies  Allergen Reactions  . Penicillins Shortness Of Breath    REACTION: hives  . Amoxicillin     REACTION: hives  . Atorvastatin  REACTION: joint pain  . Bactrim [Sulfamethoxazole-Trimethoprim] Hives  . Cephalexin     REACTION: hives  . Mometasone Furoate     REACTION: headache  . Rosuvastatin     REACTION: headache   Current Outpatient Medications on File Prior to Visit  Medication Sig Dispense Refill  . cholecalciferol (VITAMIN D) 1000 UNITS tablet Take 1,000 Units by mouth daily.      . cyclobenzaprine (FLEXERIL) 10 MG tablet Take 1 tablet (10 mg total) 3 (three) times daily as needed by mouth for muscle spasms. Caution of sedation 30 tablet 0  . fluocinonide gel (LIDEX) 0.05 % APPLY SPARINGLY TO THE AFFECTED AREA 3 TIMES DAILY UNTIL RESOLVED. NOTHING BY MOUTH FOR 30 MINS  1  . Multiple Vitamins-Minerals (MULTIVITAMIN ADULTS 50+ PO) Take 1 capsule by mouth daily.    . naproxen sodium (ANAPROX) 220 MG tablet Take 440 mg by mouth 2 (two) times daily as needed.    . Omega-3 Fatty Acids (FISH OIL PO) Take 1 capsule by mouth daily.    Marland Kitchen triamcinolone cream (KENALOG) 0.1 % Apply 1 application topically 2 (two) times daily. To affected areas 30 g 0  . Zinc 50 MG CAPS Take 1 capsule by mouth daily.     No current facility-administered medications on file prior to visit.     Review of Systems  Constitutional: Negative for activity change, appetite change, fatigue, fever and unexpected weight change.  HENT: Negative for congestion, ear pain, rhinorrhea, sinus pressure and sore throat.        Lichen planus in mouth Frequent cold sores   Eyes: Negative for pain, redness and visual disturbance.  Respiratory: Negative for cough, shortness of breath and wheezing.   Cardiovascular: Negative for chest pain and palpitations.  Gastrointestinal: Negative for  abdominal pain, blood in stool, constipation and diarrhea.  Endocrine: Negative for polydipsia and polyuria.  Genitourinary: Negative for dysuria, frequency and urgency.  Musculoskeletal: Negative for arthralgias, back pain and myalgias.  Skin: Negative for pallor and rash.  Allergic/Immunologic: Negative for environmental allergies.  Neurological: Negative for dizziness, syncope and headaches.  Hematological: Negative for adenopathy. Does not bruise/bleed easily.  Psychiatric/Behavioral: Negative for decreased concentration and dysphoric mood. The patient is not nervous/anxious.        Objective:   Physical Exam Constitutional:      General: She is not in acute distress.    Appearance: Normal appearance. She is well-developed. She is not ill-appearing or diaphoretic.  HENT:     Head: Normocephalic and atraumatic.     Right Ear: Tympanic membrane, ear canal and external ear normal.     Left Ear: Tympanic membrane, ear canal and external ear normal.     Nose: Nose normal. No congestion.     Mouth/Throat:     Mouth: Mucous membranes are moist.     Pharynx: Oropharynx is clear. No posterior oropharyngeal erythema.  Eyes:     General: No scleral icterus.    Extraocular Movements: Extraocular movements intact.     Conjunctiva/sclera: Conjunctivae normal.     Pupils: Pupils are equal, round, and reactive to light.  Neck:     Musculoskeletal: Normal range of motion and neck supple. No neck rigidity or muscular tenderness.     Thyroid: No thyromegaly.     Vascular: No carotid bruit or JVD.  Cardiovascular:     Rate and Rhythm: Normal rate and regular rhythm.     Pulses: Normal pulses.     Heart sounds: Normal heart sounds.  No gallop.   Pulmonary:     Effort: Pulmonary effort is normal. No respiratory distress.     Breath sounds: Normal breath sounds. No wheezing.     Comments: Good air exch Chest:     Chest wall: No tenderness.  Abdominal:     General: Bowel sounds are normal.  There is no distension or abdominal bruit.     Palpations: Abdomen is soft. There is no mass.     Tenderness: There is no abdominal tenderness.     Hernia: No hernia is present.  Genitourinary:    Comments: Breast exam: No mass, nodules, thickening, tenderness, bulging, retraction, inflamation, nipple discharge or skin changes noted.  No axillary or clavicular LA.              Anus appears normal w/o hemorrhoids or masses     External genitalia : nl appearance and hair distribution/no lesions     Urethral meatus : nl size, no lesions or prolapse     Urethra: no masses, tenderness or scarring    Bladder : no masses or tenderness     Vagina: nl general appearance, no discharge or  Lesions, no significant cystocele  or rectocele     Cervix: surgically absent   Pap done from cervical cuff    Uterus: surgically absent    Adnexa : unable to palpate  No pelvic M or tenderness noted       Musculoskeletal: Normal range of motion.        General: No tenderness.     Right lower leg: No edema.     Left lower leg: No edema.  Lymphadenopathy:     Cervical: No cervical adenopathy.  Skin:    General: Skin is warm and dry.     Coloration: Skin is not pale.     Findings: No erythema or rash.     Comments: Solar lentigines diffusely Also sks   Neurological:     Mental Status: She is alert. Mental status is at baseline.     Cranial Nerves: No cranial nerve deficit.     Motor: No abnormal muscle tone.     Coordination: Coordination normal.     Gait: Gait normal.     Deep Tendon Reflexes: Reflexes are normal and symmetric. Reflexes normal.  Psychiatric:        Mood and Affect: Mood normal.        Cognition and Memory: Cognition and memory normal.           Assessment & Plan:   Problem List Items Addressed This Visit      Other   Hyperlipidemia    Disc goals for lipids and reasons to control them Rev last labs with pt Rev low sat fat diet in detail LDL is up  to 154 -even with fairly good diet  Intol of daily atorvastatin in the past Open to trying low dose crestor twice weekly  Re check lipid in 3 mo  inst to hold med and call if side eff Continue good diet      Relevant Medications   rosuvastatin (CRESTOR) 5 MG tablet   Well woman exam with routine gynecological exam    Reviewed health habits including diet and exercise and skin cancer prevention Reviewed appropriate screening tests for age  Also reviewed health mt list, fam hx and immunization status , as well as social and family history   See HPI Labs reviewed  Safety during pandemic discussed  Due for colonoscpy  12/21 Mammogram scheduled later this mo  Disc shingrix vaccine-declined (has had zostavax) No falls or fx  Disc plan for exercise  Advance directive utd  Hearing screen nl  No new vision concerns/-utd exam  Plan made for lipid tx  Nl gyn exam done with h/o hysterectomy and no abnormalities  Pt requested pap -done from vaginal cuff today      Relevant Orders   Cytology - PAP([place])   Medicare annual wellness visit, initial - Primary    Reviewed health habits including diet and exercise and skin cancer prevention Reviewed appropriate screening tests for age  Also reviewed health mt list, fam hx and immunization status , as well as social and family history   See HPI Labs reviewed  Safety during pandemic discussed  Due for colonoscpy 12/21 Mammogram scheduled later this mo  Disc shingrix vaccine-declined (has had zostavax) No falls or fx  Disc plan for exercise  Advance directive utd  Hearing screen nl  No new vision concerns/-utd exam  No cognitive concerns Plan made for lipid tx        Elevated glucose    132 fasting -out lab Per pt avg 90s at home (once in 120s)  disc imp of low glycemic diet and wt loss to prevent DM2         Other Visit Diagnoses    Need for influenza vaccination       Relevant Orders   Flu Vaccine QUAD High Dose(Fluad)  (Completed)

## 2019-07-06 NOTE — Assessment & Plan Note (Signed)
Disc goals for lipids and reasons to control them Rev last labs with pt Rev low sat fat diet in detail LDL is up to 154 -even with fairly good diet  Intol of daily atorvastatin in the past Open to trying low dose crestor twice weekly  Re check lipid in 3 mo  inst to hold med and call if side eff Continue good diet

## 2019-07-06 NOTE — Patient Instructions (Addendum)
If you are interested in the new shingles vaccine (Shingrix) - call your local pharmacy to check on coverage and availability  If affordable, get on a wait list at your pharmacy to get the vaccine.   To prevent diabetes Try to get most of your carbohydrates from produce (with the exception of white potatoes)  Eat less bread/pasta/rice/snack foods/cereals/sweets and other items from the middle of the grocery store (processed carbs)  For cholesterol  Avoid red meat/ fried foods/ egg yolks/ fatty breakfast meats/ butter, cheese and high fat dairy/ and shellfish    Try 5 mg of crestor twice weekly  Plan fasting labs in 3 months

## 2019-07-06 NOTE — Assessment & Plan Note (Signed)
132 fasting -out lab Per pt avg 90s at home (once in 120s)  disc imp of low glycemic diet and wt loss to prevent DM2

## 2019-07-06 NOTE — Assessment & Plan Note (Signed)
Reviewed health habits including diet and exercise and skin cancer prevention Reviewed appropriate screening tests for age  Also reviewed health mt list, fam hx and immunization status , as well as social and family history   See HPI Labs reviewed  Safety during pandemic discussed  Due for colonoscpy 12/21 Mammogram scheduled later this mo  Disc shingrix vaccine-declined (has had zostavax) No falls or fx  Disc plan for exercise  Advance directive utd  Hearing screen nl  No new vision concerns/-utd exam  Plan made for lipid tx  Nl gyn exam done with h/o hysterectomy and no abnormalities  Pt requested pap -done from vaginal cuff today

## 2019-07-06 NOTE — Assessment & Plan Note (Addendum)
Reviewed health habits including diet and exercise and skin cancer prevention Reviewed appropriate screening tests for age  Also reviewed health mt list, fam hx and immunization status , as well as social and family history   See HPI Labs reviewed  Safety during pandemic discussed  Due for colonoscpy 12/21 Mammogram scheduled later this mo  Disc shingrix vaccine-declined (has had zostavax) No falls or fx  Disc plan for exercise  Advance directive utd  Hearing screen nl  No new vision concerns/-utd exam  No cognitive concerns Plan made for lipid tx

## 2019-07-08 LAB — CYTOLOGY - PAP: Diagnosis: NEGATIVE

## 2019-07-22 ENCOUNTER — Ambulatory Visit: Payer: PPO

## 2019-09-16 ENCOUNTER — Ambulatory Visit: Payer: PPO

## 2019-10-05 ENCOUNTER — Ambulatory Visit: Payer: PPO

## 2019-10-06 ENCOUNTER — Encounter: Payer: Self-pay | Admitting: Family Medicine

## 2019-10-21 ENCOUNTER — Other Ambulatory Visit: Payer: Self-pay

## 2019-10-21 ENCOUNTER — Ambulatory Visit
Admission: RE | Admit: 2019-10-21 | Discharge: 2019-10-21 | Disposition: A | Discharge status: Home / Self Care | Payer: PPO | Source: Ambulatory Visit | Attending: Family Medicine | Admitting: Family Medicine

## 2019-10-21 DIAGNOSIS — Z1231 Encounter for screening mammogram for malignant neoplasm of breast: Secondary | ICD-10-CM | POA: Diagnosis not present

## 2019-10-21 DIAGNOSIS — L820 Inflamed seborrheic keratosis: Secondary | ICD-10-CM | POA: Diagnosis not present

## 2019-10-21 DIAGNOSIS — L814 Other melanin hyperpigmentation: Secondary | ICD-10-CM | POA: Diagnosis not present

## 2019-10-21 DIAGNOSIS — Z85828 Personal history of other malignant neoplasm of skin: Secondary | ICD-10-CM | POA: Diagnosis not present

## 2019-10-24 ENCOUNTER — Ambulatory Visit: Payer: PPO

## 2020-02-09 DIAGNOSIS — Z961 Presence of intraocular lens: Secondary | ICD-10-CM | POA: Diagnosis not present

## 2020-02-09 DIAGNOSIS — H43813 Vitreous degeneration, bilateral: Secondary | ICD-10-CM | POA: Diagnosis not present

## 2020-02-09 DIAGNOSIS — H52203 Unspecified astigmatism, bilateral: Secondary | ICD-10-CM | POA: Diagnosis not present

## 2020-02-09 DIAGNOSIS — H04123 Dry eye syndrome of bilateral lacrimal glands: Secondary | ICD-10-CM | POA: Diagnosis not present

## 2020-03-22 ENCOUNTER — Other Ambulatory Visit: Payer: Self-pay

## 2020-05-03 ENCOUNTER — Other Ambulatory Visit: Payer: Self-pay | Admitting: Family Medicine

## 2020-05-04 NOTE — Telephone Encounter (Signed)
CPE was on 07/06/19, also last filled on 07/06/19 #12 tabs with 1 refill, please advise

## 2020-05-26 DIAGNOSIS — B353 Tinea pedis: Secondary | ICD-10-CM | POA: Diagnosis not present

## 2020-05-26 DIAGNOSIS — B029 Zoster without complications: Secondary | ICD-10-CM | POA: Diagnosis not present

## 2020-05-26 DIAGNOSIS — L814 Other melanin hyperpigmentation: Secondary | ICD-10-CM | POA: Diagnosis not present

## 2020-05-26 DIAGNOSIS — L821 Other seborrheic keratosis: Secondary | ICD-10-CM | POA: Diagnosis not present

## 2020-05-26 DIAGNOSIS — D225 Melanocytic nevi of trunk: Secondary | ICD-10-CM | POA: Diagnosis not present

## 2020-05-26 DIAGNOSIS — B078 Other viral warts: Secondary | ICD-10-CM | POA: Diagnosis not present

## 2020-05-26 DIAGNOSIS — B351 Tinea unguium: Secondary | ICD-10-CM | POA: Diagnosis not present

## 2020-05-26 DIAGNOSIS — D2262 Melanocytic nevi of left upper limb, including shoulder: Secondary | ICD-10-CM | POA: Diagnosis not present

## 2020-05-26 DIAGNOSIS — L718 Other rosacea: Secondary | ICD-10-CM | POA: Diagnosis not present

## 2020-05-26 DIAGNOSIS — Z85828 Personal history of other malignant neoplasm of skin: Secondary | ICD-10-CM | POA: Diagnosis not present

## 2020-05-26 DIAGNOSIS — L570 Actinic keratosis: Secondary | ICD-10-CM | POA: Diagnosis not present

## 2020-06-07 ENCOUNTER — Telehealth: Payer: Self-pay | Admitting: Family Medicine

## 2020-06-07 NOTE — Telephone Encounter (Signed)
LVM for pt to RTN my call to r/s appt with [facility] on 07/07/20

## 2020-07-07 ENCOUNTER — Other Ambulatory Visit (INDEPENDENT_AMBULATORY_CARE_PROVIDER_SITE_OTHER): Payer: PPO

## 2020-07-07 ENCOUNTER — Ambulatory Visit: Payer: PPO

## 2020-07-07 ENCOUNTER — Telehealth (INDEPENDENT_AMBULATORY_CARE_PROVIDER_SITE_OTHER): Payer: PPO | Admitting: Family Medicine

## 2020-07-07 ENCOUNTER — Other Ambulatory Visit: Payer: Self-pay

## 2020-07-07 DIAGNOSIS — E7800 Pure hypercholesterolemia, unspecified: Secondary | ICD-10-CM

## 2020-07-07 DIAGNOSIS — R7309 Other abnormal glucose: Secondary | ICD-10-CM | POA: Diagnosis not present

## 2020-07-07 DIAGNOSIS — Z0000 Encounter for general adult medical examination without abnormal findings: Secondary | ICD-10-CM | POA: Diagnosis not present

## 2020-07-07 LAB — COMPREHENSIVE METABOLIC PANEL
ALT: 16 U/L (ref 0–35)
AST: 19 U/L (ref 0–37)
Albumin: 4.3 g/dL (ref 3.5–5.2)
Alkaline Phosphatase: 100 U/L (ref 39–117)
BUN: 14 mg/dL (ref 6–23)
CO2: 31 mEq/L (ref 19–32)
Calcium: 10.2 mg/dL (ref 8.4–10.5)
Chloride: 104 mEq/L (ref 96–112)
Creatinine, Ser: 0.79 mg/dL (ref 0.40–1.20)
GFR: 76.95 mL/min (ref >60.00)
Glucose, Bld: 95 mg/dL (ref 70–99)
Potassium: 4.3 mEq/L (ref 3.5–5.1)
Sodium: 140 mEq/L (ref 135–145)
Total Bilirubin: 0.5 mg/dL (ref 0.2–1.2)
Total Protein: 6.5 g/dL (ref 6.0–8.3)

## 2020-07-07 LAB — CBC WITH DIFFERENTIAL/PLATELET
Basophils Absolute: 0.1 10*3/uL (ref 0.0–0.1)
Basophils Relative: 0.8 % (ref 0.0–3.0)
Eosinophils Absolute: 0.1 10*3/uL (ref 0.0–0.7)
Eosinophils Relative: 2.1 % (ref 0.0–5.0)
HCT: 42.5 % (ref 36.0–46.0)
Hemoglobin: 14.3 g/dL (ref 12.0–15.0)
Lymphocytes Relative: 26.6 % (ref 12.0–46.0)
Lymphs Abs: 1.7 10*3/uL (ref 0.7–4.0)
MCHC: 33.6 g/dL (ref 30.0–36.0)
MCV: 95.2 fl (ref 78.0–100.0)
Monocytes Absolute: 0.5 10*3/uL (ref 0.1–1.0)
Monocytes Relative: 7.7 % (ref 3.0–12.0)
Neutro Abs: 4 10*3/uL (ref 1.4–7.7)
Neutrophils Relative %: 62.8 % (ref 43.0–77.0)
Platelets: 172 10*3/uL (ref 150.0–400.0)
RBC: 4.46 Mil/uL (ref 3.87–5.11)
RDW: 13.4 % (ref 11.5–15.5)
WBC: 6.3 10*3/uL (ref 4.0–10.5)

## 2020-07-07 LAB — LIPID PANEL
Cholesterol: 201 mg/dL — ABNORMAL HIGH (ref 0–200)
HDL: 56.7 mg/dL (ref >39.00)
LDL Cholesterol: 113 mg/dL — ABNORMAL HIGH (ref 0–99)
NonHDL: 144.2
Total CHOL/HDL Ratio: 4
Triglycerides: 156 mg/dL — ABNORMAL HIGH (ref 0.0–149.0)
VLDL: 31.2 mg/dL (ref 0.0–40.0)

## 2020-07-07 LAB — TSH: TSH: 3 u[IU]/mL (ref 0.35–4.50)

## 2020-07-07 LAB — HEMOGLOBIN A1C: Hgb A1c MFr Bld: 5.8 % (ref 4.6–6.5)

## 2020-07-07 NOTE — Telephone Encounter (Signed)
Labs

## 2020-07-11 ENCOUNTER — Other Ambulatory Visit: Payer: Self-pay

## 2020-07-11 ENCOUNTER — Ambulatory Visit (INDEPENDENT_AMBULATORY_CARE_PROVIDER_SITE_OTHER): Payer: PPO | Admitting: Family Medicine

## 2020-07-11 ENCOUNTER — Encounter: Payer: Self-pay | Admitting: Family Medicine

## 2020-07-11 VITALS — BP 133/85 | HR 71 | Temp 96.3°F | Ht 64.25 in | Wt 169.0 lb

## 2020-07-11 DIAGNOSIS — E7800 Pure hypercholesterolemia, unspecified: Secondary | ICD-10-CM

## 2020-07-11 DIAGNOSIS — R7309 Other abnormal glucose: Secondary | ICD-10-CM | POA: Diagnosis not present

## 2020-07-11 DIAGNOSIS — Z0000 Encounter for general adult medical examination without abnormal findings: Secondary | ICD-10-CM

## 2020-07-11 DIAGNOSIS — Z23 Encounter for immunization: Secondary | ICD-10-CM

## 2020-07-11 MED ORDER — VALACYCLOVIR HCL 1 G PO TABS
ORAL_TABLET | ORAL | 3 refills | Status: DC
Start: 2020-07-11 — End: 2021-06-05

## 2020-07-11 NOTE — Progress Notes (Signed)
Subjective:    Patient ID: Alisha Long, female    DOB: 18-Jun-1951, 69 y.o.   MRN: [id number]  This visit occurred during the SARS-CoV-2 public health emergency.  Safety protocols were in place, including screening questions prior to the visit, additional usage of staff PPE, and extensive cleaning of exam room while observing appropriate contact time as indicated for disinfecting solutions.    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 How much D) Is outdoors Exercise -chases a 29 y old  Used to exercise more - life got hectic /perhaps can do at home    H/o elevated glucose Lab Results  Component Value Date   HGBA1C 5.8 07/07/2020  changed her tea to unsweet    BP Readings from Last 3 Encounters:  07/11/20 (!) 152/78  07/06/19 125/80  04/17/19 140/84  re check BP: 133/85  Takes some ibuprofen or aleve daily  At home 130s/70s  Pulse Readings from Last 3 Encounters:  07/11/20 71  07/06/19 72  04/17/19 67   Cholesterol  Lab Results  Component Value Date   CHOL 201 (H) 07/07/2020   CHOL 234 (H) 05/25/2019   CHOL 234 (H) 05/23/2018   Lab Results  Component Value Date   HDL 56.70 07/07/2020   HDL 57.20 05/25/2019   HDL 53.70 05/23/2018   Lab Results  Component Value Date   LDLCALC 113 (H) 07/07/2020   LDLCALC 144 (H)  05/23/2018   LDLCALC 128 (H) 08/17/2008   Lab Results  Component Value Date   TRIG 156.0 (H) 07/07/2020   TRIG 238.0 (H) 05/25/2019   TRIG 183.0 (H) 05/23/2018   Lab Results  Component Value Date   CHOLHDL 4 07/07/2020   CHOLHDL 4 05/25/2019   CHOLHDL 4 05/23/2018   Lab Results  Component Value Date   LDLDIRECT 154.0 05/25/2019   LDLDIRECT 144.0 05/13/2017   LDLDIRECT 135.0 05/14/2016   Eating more chicken instead of beef  She takes cinnamon also (? If it helps)  Drinking more lemon in water   Lab Results  Component Value Date   WBC 6.3 07/07/2020   HGB 14.3 07/07/2020   HCT 42.5 07/07/2020   MCV 95.2 07/07/2020   PLT 172.0 07/07/2020   Lab Results  Component Value Date   TSH 3.00 07/07/2020    Lab Results  Component Value Date   CREATININE 0.79 07/07/2020   BUN 14 07/07/2020   NA 140 07/07/2020   K 4.3 07/07/2020   CL 104 07/07/2020   CO2 31 07/07/2020   Lab Results  Component Value Date   ALT 16 07/07/2020   AST 19 07/07/2020   ALKPHOS 100 07/07/2020   BILITOT 0.5 07/07/2020  Patient Active Problem List   Diagnosis Date Noted  . Routine general medical examination at a health care facility 07/07/2020  . Medicare annual wellness visit, initial 07/06/2019  . Elevated glucose 07/06/2019  . Lichen planus 02/58/5277  . 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 Welcome to Medicare preventive visit 05/21/2017  . Seborrheic keratosis 12/14/2016  . Estrogen deficiency 05/24/2015  . Encounter for routine gynecological examination 04/15/2012  . Well woman exam with routine gynecological exam 04/07/2012  . HEADACHE 11/01/2010  . COLONIC POLYPS 01/06/2010  . POSTNASAL DRIP 01/06/2010  . IRON DEFICIENCY ANEMIA, HX OF 11/07/2009  . OTHER SEBORRHEIC KERATOSIS 07/19/2009  . SYMPTOM, INSOMNIA NOS 06/18/2007  . Hyperlipidemia 06/11/2007   Past Medical History:  Diagnosis Date  . Colon polyps   . History of shingles   . Hyperlipidemia   . Hypertension   .  Insomnia   . Palpitations   . Post-nasal drip    with chronic cough   Past Surgical History:  Procedure Laterality Date  . ABDOMINAL HYSTERECTOMY     bleeding, ovaries intact  . BREAST EXCISIONAL BIOPSY Left   . CARPAL TUNNEL RELEASE  11/2004  . CESAREAN SECTION     x 3  . CHOLECYSTECTOMY  12/2005  . COLONOSCOPY     polyps  . ESOPHAGOGASTRODUODENOSCOPY     nml  . gallstones     Abd [place]   . KNEE SURGERY     10/2000  . KNEE SURGERY  01/2001   replaced ACL   Social History   Tobacco Use  . Smoking status: Former Research scientist (life sciences)  . Smokeless tobacco: Never Used  Vaping Use  . Vaping Use: Never used  Substance Use Topics  . Alcohol use: Yes    Alcohol/week: 0.0 standard drinks    Comment: wine every once in a while  . Drug use: No   Family History  Problem Relation Age of Onset  . Seizures Father   . Hyperlipidemia Mother   . Aneurysm Maternal Grandmother   . Hyperlipidemia Maternal Grandmother   . Hypertension Maternal Grandmother   . Colon cancer Neg Hx   . Breast cancer Neg Hx    Allergies  Allergen Reactions  . Penicillins Shortness Of Breath    REACTION: hives  . Amoxicillin     REACTION: hives  . Atorvastatin     REACTION: joint pain  . Bactrim [Sulfamethoxazole-Trimethoprim] Hives  . Cephalexin     REACTION: hives  . Mometasone Furoate     REACTION: headache  . Rosuvastatin     REACTION: headache   Current Outpatient Medications on File Prior to Visit  Medication Sig Dispense Refill  . ciclopirox (LOPROX) 0.77 % cream Apply topically.    . cyclobenzaprine (FLEXERIL) 10 MG tablet Take 1 tablet (10 mg total) 3 (three) times daily as needed by mouth for muscle spasms. Caution of sedation 30 tablet 0  . Multiple Vitamins-Minerals (MULTIVITAMIN ADULTS 50+ PO) Take 1 capsule by mouth daily.    . naproxen sodium (ANAPROX) 220 MG tablet Take 440 mg by mouth 2 (two) times daily as needed.    . triamcinolone cream (KENALOG) 0.1 % Apply 1 application topically 2  (two) times daily. To affected areas 30 g 0   No current facility-administered medications on file prior to visit.    Review of Systems  Constitutional: Negative for activity change, appetite change, fatigue, fever and unexpected weight change.  HENT: Negative for congestion, ear pain, rhinorrhea, sinus pressure and sore throat.  Eyes: Negative for pain, redness and visual disturbance.  Respiratory: Negative for cough, shortness of breath and wheezing.   Cardiovascular: Negative for chest pain and palpitations.  Gastrointestinal: Negative for abdominal pain, blood in stool, constipation and diarrhea.  Endocrine: Negative for polydipsia and polyuria.  Genitourinary: Negative for dysuria, frequency and urgency.  Musculoskeletal: Positive for arthralgias. Negative for back pain and myalgias.       Knee pain   Skin: Negative for pallor and rash.  Allergic/Immunologic: Negative for environmental allergies.  Neurological: Negative for dizziness, syncope and headaches.  Hematological: Negative for adenopathy. Does not bruise/bleed easily.  Psychiatric/Behavioral: Negative for decreased concentration and dysphoric mood. The patient is not nervous/anxious.        Objective:   Physical Exam Constitutional:      General: She is not in acute distress.    Appearance: Normal appearance. She is well-developed and normal weight. She is not ill-appearing or diaphoretic.  HENT:     Head: Normocephalic and atraumatic.     Right Ear: Tympanic membrane, ear canal and external ear normal.     Left Ear: Tympanic membrane, ear canal and external ear normal.     Nose: Nose normal. No congestion.     Mouth/Throat:     Mouth: Mucous membranes are moist.     Pharynx: Oropharynx is clear. No posterior oropharyngeal erythema.  Eyes:     General: No scleral icterus.    Extraocular Movements: Extraocular movements intact.     Conjunctiva/sclera: Conjunctivae normal.     Pupils: Pupils are equal, round, and  reactive to light.  Neck:     Thyroid: No thyromegaly.     Vascular: No carotid bruit or JVD.  Cardiovascular:     Rate and Rhythm: Normal rate and regular rhythm.     Pulses: Normal pulses.     Heart sounds: Normal heart sounds. No gallop.   Pulmonary:     Effort: Pulmonary effort is normal. No respiratory distress.     Breath sounds: Normal breath sounds. No wheezing.     Comments: Good air exch Chest:     Chest wall: No tenderness.  Abdominal:     General: Bowel sounds are normal. There is no distension or abdominal bruit.     Palpations: Abdomen is soft. There is no mass.     Tenderness: There is no abdominal tenderness.     Hernia: No hernia is present.  Genitourinary:    Comments: Breast exam: No mass, nodules, thickening, tenderness, bulging, retraction, inflamation, nipple discharge or skin changes noted.  No axillary or clavicular LA.     Musculoskeletal:        General: No tenderness. Normal range of motion.     Cervical back: Normal range of motion and neck supple. No rigidity. No muscular tenderness.     Right lower leg: No edema.     Left lower leg: No edema.     Comments: No kyphosis   Lymphadenopathy:     Cervical: No cervical adenopathy.  Skin:    General: Skin is warm and dry.     Coloration: Skin is not pale.     Findings: No erythema or rash.     Comments: Solar lentigines diffusely   Neurological:     Mental Status: She is alert. Mental status is at baseline.     Cranial Nerves: No cranial nerve deficit.     Motor: No abnormal muscle tone.     Coordination: Coordination normal.  Gait: Gait normal.     Deep Tendon Reflexes: Reflexes are normal and symmetric. Reflexes normal.  Psychiatric:        Mood and Affect: Mood normal.        Cognition and Memory: Cognition and memory normal.           Assessment & Plan:   Problem List Items Addressed This Visit      Other   Hyperlipidemia    Disc goals for lipids and reasons to control them Rev  last labs with pt Rev low sat fat diet in detail  LDL is down to 113 with better diet  Intolerant of atorvastatin and rosuvastatin       Elevated glucose    Lab Results  Component Value Date   HGBA1C 5.8 07/07/2020   disc imp of low glycemic diet and wt loss to prevent DM2       Routine general medical examination at a health care facility - Primary    Reviewed health habits including diet and exercise and skin cancer prevention Reviewed appropriate screening tests for age  Also reviewed health mt list, fam hx and immunization status , as well as social and family history   See HPI Labs reviewed  Vaccinated for covid  Flu shot given today  Interested in shingrix if covered  Colonoscopy due 07/2021 Disc plan for more regular exercise  Recommend 2000 iu of vit D daily for bone health         Other Visit Diagnoses    Need for immunization against influenza       Relevant Orders   Flu Vaccine QUAD High Dose(Fluad) (Completed)

## 2020-07-11 NOTE — Patient Instructions (Addendum)
If you are interested in the new shingles vaccine (Shingrix) - call your local pharmacy to check on coverage and availability  If affordable, get on a wait list at your pharmacy to get the vaccine.  Get some extra vitamin D  I want you to get 2000 iu daily  Chair yoga video or silver sneakers at home are a good idea   If you take ibuprofen or aleve- take with food and stay hydrated

## 2020-07-11 NOTE — Assessment & Plan Note (Signed)
Disc goals for lipids and reasons to control them Rev last labs with pt Rev low sat fat diet in detail  LDL is down to 113 with better diet  Intolerant of atorvastatin and rosuvastatin

## 2020-07-11 NOTE — Assessment & Plan Note (Signed)
Lab Results  Component Value Date   HGBA1C 5.8 07/07/2020   disc imp of low glycemic diet and wt loss to prevent DM2

## 2020-07-11 NOTE — Assessment & Plan Note (Signed)
Reviewed health habits including diet and exercise and skin cancer prevention Reviewed appropriate screening tests for age  Also reviewed health mt list, fam hx and immunization status , as well as social and family history   See HPI Labs reviewed  Vaccinated for covid  Flu shot given today  Interested in shingrix if covered  Colonoscopy due 07/2021 Disc plan for more regular exercise  Recommend 2000 iu of vit D daily for bone health

## 2020-07-26 ENCOUNTER — Ambulatory Visit (INDEPENDENT_AMBULATORY_CARE_PROVIDER_SITE_OTHER): Payer: PPO

## 2020-07-26 ENCOUNTER — Encounter: Payer: Self-pay | Admitting: Orthopaedic Surgery

## 2020-07-26 ENCOUNTER — Ambulatory Visit: Payer: PPO | Admitting: Orthopaedic Surgery

## 2020-07-26 ENCOUNTER — Other Ambulatory Visit: Payer: Self-pay

## 2020-07-26 ENCOUNTER — Encounter: Payer: Self-pay | Admitting: Family Medicine

## 2020-07-26 ENCOUNTER — Ambulatory Visit: Payer: Self-pay

## 2020-07-26 VITALS — Ht 64.5 in | Wt 168.0 lb

## 2020-07-26 DIAGNOSIS — M170 Bilateral primary osteoarthritis of knee: Secondary | ICD-10-CM

## 2020-07-26 DIAGNOSIS — M25562 Pain in left knee: Secondary | ICD-10-CM

## 2020-07-26 DIAGNOSIS — G8929 Other chronic pain: Secondary | ICD-10-CM

## 2020-07-26 DIAGNOSIS — M25561 Pain in right knee: Secondary | ICD-10-CM

## 2020-07-26 MED ORDER — METHYLPREDNISOLONE ACETATE 40 MG/ML IJ SUSP
80.0000 mg | INTRAMUSCULAR | Status: AC | PRN
  Administered 2020-07-26: 80 mg via INTRA_ARTICULAR

## 2020-07-26 MED ORDER — LIDOCAINE HCL 1 % IJ SOLN
2.0000 mL | INTRAMUSCULAR | Status: AC | PRN
  Administered 2020-07-26: 2 mL

## 2020-07-26 MED ORDER — BUPIVACAINE HCL 0.5 % IJ SOLN
2.0000 mL | INTRAMUSCULAR | Status: AC | PRN
  Administered 2020-07-26: 2 mL via INTRA_ARTICULAR

## 2020-07-26 NOTE — Progress Notes (Signed)
Office Visit Note   Patient: Alisha Long           Date of Birth: Jul 20, 1951           MRN: [id number] Visit Date: 07/26/2020              Requested by: Tower, Wynelle Fanny, MD [place],  [place] [address] PCP: Abner Greenspan, MD   Assessment & Plan: Visit Diagnoses:  1. Chronic pain of both knees   2. Bilateral primary osteoarthritis of knee     Plan: End-stage osteoarthritis both knees.  Long discussion regarding the diagnosis and treatment options.  Alisha Long is taking NSAIDs and Tylenol as needed and still having some trouble.  Will inject both knees with cortisone and then monitor response.  Could be a candidate for viscosupplementation.  She is not interested at this point and knee replacement surgery.  Follow-Up Instructions: Return if symptoms worsen or fail to improve.   Orders:  Orders Placed This Encounter  Procedures  . Large Joint Inj: bilateral knee  . XR KNEE 3 VIEW LEFT  . XR KNEE 3 VIEW RIGHT   No orders of the defined types were placed in this encounter.     Procedures: Large Joint Inj: bilateral knee on 07/26/2020 3:34 PM Indications: diagnostic evaluation Details: 25 G 1.5 in needle, anteromedial approach  Arthrogram: No  Medications (Right): 2 mL lidocaine 1 %; 2 mL bupivacaine 0.5 %; 80 mg methylPREDNISolone acetate 40 MG/ML Medications (Left): 2 mL lidocaine 1 %; 2 mL bupivacaine 0.5 %; 80 mg methylPREDNISolone acetate 40 MG/ML Consent was given by the patient. Immediately prior to procedure a time out was called to verify the correct patient, procedure, equipment, support staff and site/side marked as required. Patient was prepped and draped in the usual sterile fashion.       Clinical Data: No additional findings.   Subjective: Chief Complaint  Patient presents with  . Right Knee - Pain  . Left Knee - Pain  Patient presents today for bilateral knee pain. She said that they have hurt for years. No known injury.  Overtime the pain has worsened. She said that they hurt equally the same, but the left knee will give way. Both knees hurt more medial and anteriorly. She said that they both grind and makes noises with stairs. No swelling that she has noticed. She takes Tylenol, Aleve, and Ibuprofen daily. She has a history of a right knee scope several years ago, and a left knee ACL repair probably 20 years ago Presently cares for her 66-year-old grandson and needs to be active.   HPI  Review of Systems   Objective: Vital Signs: Ht 5' 4.5" (1.638 m)   Wt 168 lb (76.2 kg)   BMI 28.39 kg/m   Physical Exam Constitutional:      Appearance: She is well-developed.  Eyes:     Pupils: Pupils are equal, round, and reactive to light.  Pulmonary:     Effort: Pulmonary effort is normal.  Skin:    General: Skin is warm and dry.  Neurological:     Mental Status: She is alert and oriented to person, place, and time.  Psychiatric:        Behavior: Behavior normal.     Ortho Exam awake alert and oriented x3.  Comfortable sitting.  Increased varus both knees but with full extension and flexion over 100 degrees.  No instability of either knee.  Small effusion right  knee.  None on the left.  Positive patella crepitation but no patella pain.  Predominately medial joint pain bilaterally.  Well-healed surgical scars in both knees from prior arthroscopy on the right and ACL reconstruction portals on the left.  Negative anterior drawer sign and Lachman's on the left.  No distal edema.  Motor exam intact  Specialty Comments:  No specialty comments available.  Imaging: XR KNEE 3 VIEW LEFT  Result Date: 07/26/2020 Films of the left knee were obtained in 3 projections standing and compared to the right knee.  Changes are similar.  There is bone-on-bone in the medial compartment with subchondral sclerosis and peripheral osteophytes.  There is approximately 10 degrees of varus similar to the right knee.  Patellofemoral  changes are also moderate to advanced over the patella tracks in the midline.  Some degenerative changes laterally with small peripheral osteophytes.  No ectopic calcification or acute changes.  Films are consistent with advanced osteoarthritis.  There are interference screws in the femur and tibia in good position per prior anterior cruciate ligament reconstruction  XR KNEE 3 VIEW RIGHT  Result Date: 07/26/2020 Films of the right knee were obtained in 3 projections standing.  There are end-stage degenerative changes in the medial compartment with is essentially bone-on-bone, peripheral osteophytes and subchondral sclerosis.  There are also significant degenerative changes the patellofemoral joint level patella tracks in the midline.  Some degenerative changes noted in the lateral compartment.  Films are consistent with advanced tricompartmental degenerative arthritis    PMFS History: Patient Active Problem List   Diagnosis Date Noted  . Bilateral primary osteoarthritis of knee 07/26/2020  . Routine general medical examination at a health care facility 07/07/2020  . Medicare annual wellness visit, initial 07/06/2019  . Elevated glucose 07/06/2019  . Lichen planus 38/05/1750  . 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 Welcome to Medicare preventive visit 05/21/2017  . Seborrheic keratosis 12/14/2016  . Estrogen deficiency 05/24/2015  . Encounter for routine gynecological examination 04/15/2012  . Well woman exam with routine gynecological exam 04/07/2012  . HEADACHE 11/01/2010  . COLONIC POLYPS 01/06/2010  . POSTNASAL DRIP 01/06/2010  . IRON DEFICIENCY ANEMIA, HX OF 11/07/2009  . OTHER SEBORRHEIC KERATOSIS 07/19/2009  . SYMPTOM, INSOMNIA NOS 06/18/2007  . Hyperlipidemia 06/11/2007   Past Medical History:  Diagnosis Date  . Colon polyps   . History of shingles   . Hyperlipidemia   . Hypertension   . Insomnia   . Palpitations   . Post-nasal drip    with chronic cough    Family History    Problem Relation Age of Onset  . Seizures Father   . Hyperlipidemia Mother   . Aneurysm Maternal Grandmother   . Hyperlipidemia Maternal Grandmother   . Hypertension Maternal Grandmother   . Colon cancer Neg Hx   . Breast cancer Neg Hx     Past Surgical History:  Procedure Laterality Date  . ABDOMINAL HYSTERECTOMY     bleeding, ovaries intact  . BREAST EXCISIONAL BIOPSY Left   . CARPAL TUNNEL RELEASE  11/2004  . CESAREAN SECTION     x 3  . CHOLECYSTECTOMY  12/2005  . COLONOSCOPY     polyps  . ESOPHAGOGASTRODUODENOSCOPY     nml  . gallstones     Abd [place]   . KNEE SURGERY     10/2000  . KNEE SURGERY  01/2001   replaced ACL   Social History   Occupational History  . Occupation: Arboriculturist:  [employer]    Employer: UNEMPLOYED  Tobacco Use  . Smoking status: Former Research scientist (life sciences)  . Smokeless tobacco: Never Used  Vaping Use  . Vaping Use: Never used  Substance and Sexual Activity  . Alcohol use: Yes    Alcohol/week: 0.0 standard drinks    Comment: wine every once in a while  . Drug use: No  . Sexual activity: Not on file

## 2020-07-29 ENCOUNTER — Other Ambulatory Visit: Payer: Self-pay

## 2020-07-29 ENCOUNTER — Ambulatory Visit (INDEPENDENT_AMBULATORY_CARE_PROVIDER_SITE_OTHER): Payer: PPO

## 2020-07-29 DIAGNOSIS — Z0000 Encounter for general adult medical examination without abnormal findings: Secondary | ICD-10-CM

## 2020-07-29 NOTE — Progress Notes (Signed)
Subjective:   Alisha Long is a 69 y.o. female who presents for Medicare Annual (Subsequent) preventive examination.  Review of Systems: N/A      I connected with the patient today by telephone and verified that I am speaking with the correct person using two identifiers. Location patient: home Location nurse: work Persons participating in the telephone visit: patient, nurse.   I discussed the limitations, risks, security and privacy concerns of performing an evaluation and management service by telephone and the availability of in person appointments. I also discussed with the patient that there may be a patient responsible charge related to this service. The patient expressed understanding and verbally consented to this telephonic visit.        Cardiac Risk Factors include: advanced age (>81men, >13 women);Other (see comment), Risk factor comments: hyperlipidemia     Objective:    Today's Vitals   There is no height or weight on file to calculate BMI.  Advanced Directives 07/29/2020 05/23/2018 07/31/2016 07/17/2016  Does Patient Have a Medical Advance Directive? Yes Yes Yes Yes  Type of Paramedic of [place];Living will Living will - -  Copy of [place] in Chart? No - copy requested - - -  Would patient like information on creating a medical advance directive? - No - Patient declined - -    Current Medications (verified) Outpatient Encounter Medications as of 07/29/2020  Medication Sig  . ciclopirox (LOPROX) 0.77 % cream Apply topically.  . cyclobenzaprine (FLEXERIL) 10 MG tablet Take 1 tablet (10 mg total) 3 (three) times daily as needed by mouth for muscle spasms. Caution of sedation  . Multiple Vitamins-Minerals (MULTIVITAMIN ADULTS 50+ PO) Take 1 capsule by mouth daily.  . naproxen sodium (ANAPROX) 220 MG tablet Take 440 mg by mouth 2 (two) times daily as needed.  . triamcinolone cream (KENALOG) 0.1 % Apply 1 application  topically 2 (two) times daily. To affected areas  . valACYclovir (VALTREX) 1000 MG tablet TAKE 2 TABLETS BY MOUTH 2 TIMES DAILY ASNEEDED FOR COLD SORES FOR 1 DAY   No facility-administered encounter medications on file as of 07/29/2020.    Allergies (verified) Penicillins, Amoxicillin, Atorvastatin, Bactrim [sulfamethoxazole-trimethoprim], Cephalexin, Mometasone furoate, and Rosuvastatin   History: Past Medical History:  Diagnosis Date  . Colon polyps   . History of shingles   . Hyperlipidemia   . Hypertension   . Insomnia   . Palpitations   . Post-nasal drip    with chronic cough   Past Surgical History:  Procedure Laterality Date  . ABDOMINAL HYSTERECTOMY     bleeding, ovaries intact  . BREAST EXCISIONAL BIOPSY Left   . CARPAL TUNNEL RELEASE  11/2004  . CESAREAN SECTION     x 3  . CHOLECYSTECTOMY  12/2005  . COLONOSCOPY     polyps  . ESOPHAGOGASTRODUODENOSCOPY     nml  . gallstones     Abd [place]   . KNEE SURGERY     10/2000  . KNEE SURGERY  01/2001   replaced ACL   Family History  Problem Relation Age of Onset  . Seizures Father   . Hyperlipidemia Mother   . Aneurysm Maternal Grandmother   . Hyperlipidemia Maternal Grandmother   . Hypertension Maternal Grandmother   . Colon cancer Neg Hx   . Breast cancer Neg Hx    Social History   Socioeconomic History  . Marital status: Married    Spouse name: Not on file  . Number  of children: 3  . Years of education: Not on file  . Highest education level: Not on file  Occupational History  . 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 Smoking status: Former Research scientist (life sciences)  . Smokeless tobacco: Never Used  Vaping Use  . Vaping Use: Never used  Substance and Sexual Activity  . Alcohol use: Yes    Alcohol/week: 0.0 standard drinks    Comment: wine every once in a while  . Drug use: No  . Sexual activity: Not on file  Other Topics Concern  . Not on file  Social  History Narrative   Married, 3 children. Works for [employer], gets regular exercise.    Social Determinants of Health   Financial Resource Strain: Low Risk   . Difficulty of Paying Living Expenses: Not hard at all  Food Insecurity: No Food Insecurity  . Worried About Charity fundraiser in the Last Year: Never true  . Ran Out of Food in the Last Year: Never true  Transportation Needs: No Transportation Needs  . Lack of Transportation (Medical): No  . Lack of Transportation (Non-Medical): No  Physical Activity: Sufficiently Active  . Days of Exercise per Week: 3 days  . Minutes of Exercise per Session: 60 min  Stress: No Stress Concern Present  . Feeling of Stress : Not at all  Social Connections:   . Frequency of Communication with Friends and Family: Not on file  . Frequency of Social Gatherings with Friends and Family: Not on file  . Attends Religious Services: Not on file  . Active Member of Clubs or Organizations: Not on file  . Attends Archivist Meetings: Not on file  . Marital Status: Not on file    Tobacco Counseling Counseling given: Not Answered   Clinical Intake:  Pre-visit preparation completed: Yes  Pain : No/denies pain     Nutritional Risks: None Diabetes: No  How often do you need to have someone help you when you read instructions, pamphlets, or other written materials from your doctor or pharmacy?: 1 - Never What is the last grade level you completed in school?: bachelors  Diabetic: No Nutrition Risk Assessment:  Has the patient had any N/V/D within the last 2 months?  No  Does the patient have any non-healing wounds?  No  Has the patient had any unintentional weight loss or weight gain?  No   Diabetes:  Is the patient diabetic?  No  If diabetic, was a CBG obtained today?  N/A Did the patient bring in their glucometer from home?  N/A How often do you monitor your CBG's? N/A.   Financial Strains and Diabetes Management:  Are  you having any financial strains with the device, your supplies or your medication? N/A.  Does the patient want to be seen by Chronic Care Management for management of their diabetes?  N/A Would the patient like to be referred to a Nutritionist or for Diabetic Management?  N/A   Interpreter Needed?: No  Information entered by :: CJohnson, LPN   Activities of Daily Living In your present state of health, do you have any difficulty performing the following activities: 07/29/2020  Hearing? N  Vision? N  Difficulty concentrating or making decisions? N  Walking or climbing stairs? N  Dressing or bathing? N  Doing errands, shopping? N  Preparing Food and eating ? N  Using the Toilet? N  In the past  six months, have you accidently leaked urine? N  Do you have problems with loss of bowel control? N  Managing your Medications? N  Managing your Finances? N  Housekeeping or managing your Housekeeping? N  Some recent data might be hidden    Patient Care Team: Tower, Wynelle Fanny, MD as PCP - General  Indicate any recent Medical Services you may have received from other than [facility] providers in the past year (date may be approximate).     Assessment:   This is a routine wellness examination for [place].  Hearing/Vision screen  Hearing Screening   125Hz  250Hz  500Hz  1000Hz  2000Hz  3000Hz  4000Hz  6000Hz  8000Hz   Right ear:           Left ear:           Vision Screening Comments: Patient gets annual eye exams   Dietary issues and exercise activities discussed: Current Exercise Habits: Structured exercise class, Type of exercise: strength training/weights, Time (Minutes): 60, Frequency (Times/Week): 3, Weekly Exercise (Minutes/Week): 180, Intensity: Moderate, Exercise limited by: None identified  Goals    . Increase physical activity     Starting 05/23/2018, I will continue to exercise for 60 minutes 3 days per week.     . Patient Stated     07/29/2020, I will continue to attend [employer]  3 days a week for 1 hour.       Depression Screen PHQ 2/9 Scores 07/29/2020 07/11/2020 07/06/2019 05/23/2018 05/21/2017  PHQ - 2 Score 0 0 0 0 0  PHQ- 9 Score 0 - - 0 -    Fall Risk Fall Risk  07/29/2020 07/11/2020 03/22/2020 07/06/2019 05/23/2018  Falls in the past year? 0 0 0 0 No  Comment - - Emmi Telephone Survey: data to providers prior to load - -  Number falls in past yr: 0 - - - -  Injury with Fall? 0 - - - -  Risk for fall due to : No Fall Risks History of fall(s) - - -  Follow up Falls evaluation completed;Falls prevention discussed - - Falls evaluation completed -    Any stairs in or around the home? Yes  If so, are there any without handrails? No  Home free of loose throw rugs in walkways, pet beds, electrical cords, etc? Yes  Adequate lighting in your home to reduce risk of falls? Yes   ASSISTIVE DEVICES UTILIZED TO PREVENT FALLS:  Life alert? No  Use of a cane, walker or w/c? No  Grab bars in the bathroom? No  Shower chair or bench in shower? No  Elevated toilet seat or a handicapped toilet? No   TIMED UP AND GO:  Was the test performed? N/A, telephone visit.   Cognitive Function: MMSE - Mini Mental State Exam 07/29/2020 05/23/2018  Orientation to time 5 5  Orientation to Place 5 5  Registration 3 3  Attention/ Calculation 5 0  Recall 3 3  Language- name 2 objects - 0  Language- repeat 1 1  Language- follow 3 step command - 3  Language- read & follow direction - 0  Write a sentence - 0  Copy design - 0  Total score - 20  Mini Cog  Mini-Cog screen was completed. Maximum score is 22. A value of 0 denotes this part of the MMSE was not completed or the patient failed this part of the Mini-Cog screening.       Immunizations Immunization History  Administered Date(s) Administered  . Fluad Quad(high Dose 65+)  07/06/2019, 07/11/2020  . Influenza,inj,Quad PF,6+ Mos 06/30/2014, 05/13/2015, 05/14/2016, 05/21/2017, 05/23/2018  . PFIZER SARS-COV-2 Vaccination  10/01/2019, 10/22/2019, 06/01/2020  . Pneumococcal Conjugate-13 05/21/2017  . Pneumococcal Polysaccharide-23 05/23/2018  . Td 04/27/2003, 09/09/2013  . Tdap 05/14/2016  . Zoster 05/06/2012    TDAP status: Up to date Flu Vaccine status: Up to date Pneumococcal vaccine status: Up to date Covid-19 vaccine status: Completed vaccines  Qualifies for Shingles Vaccine? Yes   Zostavax completed Yes   Shingrix Completed?: No.    Education has been provided regarding the importance of this vaccine. Patient has been advised to call insurance company to determine out of pocket expense if they have not yet received this vaccine. Advised may also receive vaccine at local pharmacy or Health Dept. Verbalized acceptance and understanding.  Screening Tests Health Maintenance  Topic Date Due  . MAMMOGRAM  10/20/2020  . COLONOSCOPY  07/31/2021  . TETANUS/TDAP  05/14/2026  . INFLUENZA VACCINE  Completed  . DEXA SCAN  Completed  . COVID-19 Vaccine  Completed  . Hepatitis C Screening  Completed  . PNA vac Low Risk Adult  Completed    Health Maintenance  There are no preventive care reminders to display for this patient.  Colorectal cancer screening: Completed 07/31/2016. Repeat every 5 years Mammogram status: Completed 10/21/2019. Repeat every year Bone Density status: due, Patient will call and schedule   Lung Cancer Screening: (Low Dose CT Chest recommended if Age 92-80 years, 30 pack-year currently smoking OR have quit w/in 15years.) does not qualify.   Additional Screening:  Hepatitis C Screening: does qualify; Completed 05/14/2016  Vision Screening: Recommended annual ophthalmology exams for early detection of glaucoma and other disorders of the eye. Is the patient up to date with their annual eye exam?  Yes  Who is the provider or what is the name of the office in which the patient attends annual eye exams? [facility], Dr. Satira Sark If pt is not established with a provider, would  they like to be referred to a provider to establish care? No .   Dental Screening: Recommended annual dental exams for proper oral hygiene  Community Resource Referral / Chronic Care Management: CRR required this visit?  No   CCM required this visit?  No      Plan:     I have personally reviewed and noted the following in the patient's chart:   . Medical and social history . Use of alcohol, tobacco or illicit drugs  . Current medications and supplements . Functional ability and status . Nutritional status . Physical activity . Advanced directives . List of other physicians . Hospitalizations, surgeries, and ER visits in previous 12 months . Vitals . Screenings to include cognitive, depression, and falls . Referrals and appointments  In addition, I have reviewed and discussed with patient certain preventive protocols, quality metrics, and best practice recommendations. A written personalized care plan for preventive services as well as general preventive health recommendations were provided to patient.   Due to this being a telephonic visit, the after visit summary with patients personalized plan was offered to patient via office or my-chart. Patient preferred to pick up at office at next visit or via mychart.   Andrez Grime, LPN   [id number]

## 2020-07-29 NOTE — Patient Instructions (Signed)
Alisha Long , Thank you for taking time to come for your Medicare Wellness Visit. I appreciate your ongoing commitment to your health goals. Please review the following plan we discussed and let me know if I can assist you in the future.   Screening recommendations/referrals: Colonoscopy: Up to date, completed 07/31/2016, due 07/2021 Mammogram: Up to date, completed 10/21/2019, due 09/2020 Bone Density: due, Please call and schedule appointment Recommended yearly ophthalmology/optometry visit for glaucoma screening and checkup Recommended yearly dental visit for hygiene and checkup  Vaccinations: Influenza vaccine: Up to date, completed 07/11/2020, due 03/2021 Pneumococcal vaccine: Completed series Tdap vaccine: Up to date, completed 05/14/2016, due 04/2026 Shingles vaccine: due, check with your insurance regarding coverage if interested   Covid-19:Completed series  Advanced directives: Please bring a copy of your POA (Power of [place]) and/or Living Will to your next appointment.   Conditions/risks identified: hyperlipidemia  Next appointment: Follow up in one year for your annual wellness visit    Preventive Care 65 Years and Older, Female Preventive care refers to lifestyle choices and visits with your health care provider that can promote health and wellness. What does preventive care include?  A yearly physical exam. This is also called an annual well check.  Dental exams once or twice a year.  Routine eye exams. Ask your health care provider how often you should have your eyes checked.  Personal lifestyle choices, including:  Daily care of your teeth and gums.  Regular physical activity.  Eating a healthy diet.  Avoiding tobacco and drug use.  Limiting alcohol use.  Practicing safe sex.  Taking low-dose aspirin every day.  Taking vitamin and mineral supplements as recommended by your health care provider. What happens during an annual well check? The services and  screenings done by your health care provider during your annual well check will depend on your age, overall health, lifestyle risk factors, and family history of disease. Counseling  Your health care provider may ask you questions about your:  Alcohol use.  Tobacco use.  Drug use.  Emotional well-being.  Home and relationship well-being.  Sexual activity.  Eating habits.  History of falls.  Memory and ability to understand (cognition).  Work and work Statistician.  Reproductive health. Screening  You may have the following tests or measurements:  Height, weight, and BMI.  Blood pressure.  Lipid and cholesterol levels. These may be checked every 5 years, or more frequently if you are over 29 years old.  Skin check.  Lung cancer screening. You may have this screening every year starting at age 5 if you have a 30-pack-year history of smoking and currently smoke or have quit within the past 15 years.  Fecal occult blood test (FOBT) of the stool. You may have this test every year starting at age 55.  Flexible sigmoidoscopy or colonoscopy. You may have a sigmoidoscopy every 5 years or a colonoscopy every 10 years starting at age 67.  Hepatitis C blood test.  Hepatitis B blood test.  Sexually transmitted disease (STD) testing.  Diabetes screening. This is done by checking your blood sugar (glucose) after you have not eaten for a while (fasting). You may have this done every 1-3 years.  Bone density scan. This is done to screen for osteoporosis. You may have this done starting at age 28.  Mammogram. This may be done every 1-2 years. Talk to your health care provider about how often you should have regular mammograms. Talk with your health care provider about your  test results, treatment options, and if necessary, the need for more tests. Vaccines  Your health care provider may recommend certain vaccines, such as:  Influenza vaccine. This is recommended every  year.  Tetanus, diphtheria, and acellular pertussis (Tdap, Td) vaccine. You may need a Td booster every 10 years.  Zoster vaccine. You may need this after age 69.  Pneumococcal 13-valent conjugate (PCV13) vaccine. One dose is recommended after age 17.  Pneumococcal polysaccharide (PPSV23) vaccine. One dose is recommended after age 37. Talk to your health care provider about which screenings and vaccines you need and how often you need them. This information is not intended to replace advice given to you by your health care provider. Make sure you discuss any questions you have with your health care provider. Document Released: 09/09/2015 Document Revised: 05/02/2016 Document Reviewed: 06/14/2015 Elsevier Interactive Patient Education  2017 [place] Prevention in the Home Falls can cause injuries. They can happen to people of all ages. There are many things you can do to make your home safe and to help prevent falls. What can I do on the outside of my home?  Regularly fix the edges of walkways and driveways and fix any cracks.  Remove anything that might make you trip as you walk through a door, such as a raised step or threshold.  Trim any bushes or trees on the path to your home.  Use bright outdoor lighting.  Clear any walking paths of anything that might make someone trip, such as rocks or tools.  Regularly check to see if handrails are loose or broken. Make sure that both sides of any steps have handrails.  Any raised decks and porches should have guardrails on the edges.  Have any leaves, snow, or ice cleared regularly.  Use sand or salt on walking paths during winter.  Clean up any spills in your garage right away. This includes oil or grease spills. What can I do in the bathroom?  Use night lights.  Install grab bars by the toilet and in the tub and shower. Do not use towel bars as grab bars.  Use non-skid mats or decals in the tub or shower.  If you  need to sit down in the shower, use a plastic, non-slip stool.  Keep the floor dry. Clean up any water that spills on the floor as soon as it happens.  Remove soap buildup in the tub or shower regularly.  Attach bath mats securely with double-sided non-slip rug tape.  Do not have throw rugs and other things on the floor that can make you trip. What can I do in the bedroom?  Use night lights.  Make sure that you have a light by your bed that is easy to reach.  Do not use any sheets or blankets that are too big for your bed. They should not hang down onto the floor.  Have a firm chair that has side arms. You can use this for support while you get dressed.  Do not have throw rugs and other things on the floor that can make you trip. What can I do in the kitchen?  Clean up any spills right away.  Avoid walking on wet floors.  Keep items that you use a lot in easy-to-reach places.  If you need to reach something above you, use a strong step stool that has a grab bar.  Keep electrical cords out of the way.  Do not use floor polish or wax that  makes floors slippery. If you must use wax, use non-skid floor wax.  Do not have throw rugs and other things on the floor that can make you trip. What can I do with my stairs?  Do not leave any items on the stairs.  Make sure that there are handrails on both sides of the stairs and use them. Fix handrails that are broken or loose. Make sure that handrails are as long as the stairways.  Check any carpeting to make sure that it is firmly attached to the stairs. Fix any carpet that is loose or worn.  Avoid having throw rugs at the top or bottom of the stairs. If you do have throw rugs, attach them to the floor with carpet tape.  Make sure that you have a light switch at the top of the stairs and the bottom of the stairs. If you do not have them, ask someone to add them for you. What else can I do to help prevent falls?  Wear shoes  that:  Do not have high heels.  Have rubber bottoms.  Are comfortable and fit you well.  Are closed at the toe. Do not wear sandals.  If you use a stepladder:  Make sure that it is fully opened. Do not climb a closed stepladder.  Make sure that both sides of the stepladder are locked into place.  Ask someone to hold it for you, if possible.  Clearly mark and make sure that you can see:  Any grab bars or handrails.  First and last steps.  Where the edge of each step is.  Use tools that help you move around (mobility aids) if they are needed. These include:  Canes.  Walkers.  Scooters.  Crutches.  Turn on the lights when you go into a dark area. Replace any light bulbs as soon as they burn out.  Set up your furniture so you have a clear path. Avoid moving your furniture around.  If any of your floors are uneven, fix them.  If there are any pets around you, be aware of where they are.  Review your medicines with your doctor. Some medicines can make you feel dizzy. This can increase your chance of falling. Ask your doctor what other things that you can do to help prevent falls. This information is not intended to replace advice given to you by your health care provider. Make sure you discuss any questions you have with your health care provider. Document Released: 06/09/2009 Document Revised: 01/19/2016 Document Reviewed: 09/17/2014 Elsevier Interactive Patient Education  2017 [employer].

## 2020-07-29 NOTE — Progress Notes (Signed)
PCP notes:  Health Maintenance: Dexa- due   Abnormal Screenings: none   Patient concerns: none   Nurse concerns: none   Next PCP appt: none

## 2020-08-22 ENCOUNTER — Encounter: Payer: Self-pay | Admitting: Family Medicine

## 2020-08-22 NOTE — Telephone Encounter (Signed)
Alisha Long scheduled a virtual visit for 08/24/20

## 2020-08-24 ENCOUNTER — Telehealth: Payer: PPO | Admitting: Family Medicine

## 2020-08-24 ENCOUNTER — Ambulatory Visit
Admission: EM | Admit: 2020-08-24 | Discharge: 2020-08-24 | Disposition: A | Discharge status: Home / Self Care | Payer: PPO | Attending: Family Medicine | Admitting: Family Medicine

## 2020-08-24 ENCOUNTER — Encounter: Payer: Self-pay | Admitting: Family Medicine

## 2020-08-24 DIAGNOSIS — R058 Other specified cough: Secondary | ICD-10-CM

## 2020-08-24 MED ORDER — BENZONATATE 100 MG PO CAPS: 200.0000 mg | ORAL_CAPSULE | Freq: Three times a day (TID) | ORAL | 0 refills | Status: DC

## 2020-08-24 NOTE — ED Triage Notes (Signed)
Pt reports having a cough and nasal congestion since the beginning of Dec. sts she has had 2 neg home covid test since the symptoms began. sts the cough is worse at night.  Also reports taking OTC cough medicine without relief.

## 2020-08-24 NOTE — Discharge Instructions (Addendum)
You can take the tessalon as needed for cough.  Follow up as needed for continued or worsening symptoms

## 2020-08-24 NOTE — ED Provider Notes (Signed)
Alisha Long    CSN: [id number] Arrival date & time: 08/24/20  1026      History   Chief Complaint Chief Complaint  Patient presents with  . Cough    HPI Alisha Long is a 69 y.o. female.   Patient is a 69 year old female who presents today with cough, nasal congestion, postnasal drip since the beginning of December.  She has had 2 - home Covid testing.  Reports symptoms have improved since started.  The congestion is somewhat cleared up with the cough is still lingering at nighttime.  She is taking allergy medication, Mucinex and Delsym without much relief.  No fevers, chills, body aches.       Past Medical History:  Diagnosis Date  . Colon polyps   . History of shingles   . Hyperlipidemia   . Hypertension   . Insomnia   . Palpitations   . Post-nasal drip    with chronic cough    Patient Active Problem List   Diagnosis Date Noted  . Bilateral primary osteoarthritis of knee 07/26/2020  . Routine general medical examination at a health care facility 07/07/2020  . Medicare annual wellness visit, initial 07/06/2019  . Elevated glucose 07/06/2019  . Lichen planus 05/26/2018  . 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 Welcome to Medicare preventive visit 05/21/2017  . Seborrheic keratosis 12/14/2016  . Estrogen deficiency 05/24/2015  . Encounter for routine gynecological examination 04/15/2012  . Well woman exam with routine gynecological exam 04/07/2012  . HEADACHE 11/01/2010  . COLONIC POLYPS 01/06/2010  . POSTNASAL DRIP 01/06/2010  . IRON DEFICIENCY ANEMIA, HX OF 11/07/2009  . OTHER SEBORRHEIC KERATOSIS 07/19/2009  . SYMPTOM, INSOMNIA NOS 06/18/2007  . Hyperlipidemia 06/11/2007    Past Surgical History:  Procedure Laterality Date  . ABDOMINAL HYSTERECTOMY     bleeding, ovaries intact  . BREAST EXCISIONAL BIOPSY Left   . CARPAL TUNNEL RELEASE  11/2004  . CESAREAN SECTION     x 3  . CHOLECYSTECTOMY  12/2005  . COLONOSCOPY     polyps  .  ESOPHAGOGASTRODUODENOSCOPY     nml  . gallstones     Abd [place]   . KNEE SURGERY     10/2000  . KNEE SURGERY  01/2001   replaced ACL    OB History   No obstetric history on file.      Home Medications    Prior to Admission medications   Medication Sig Start Date End Date Taking? Authorizing Provider  benzonatate (TESSALON) 100 MG capsule Take 2 capsules (200 mg total) by mouth every 8 (eight) hours. Take 1-2 capsules as needed every 8 hours 08/24/20   Dahlia Byes A, NP  ciclopirox (LOPROX) 0.77 % cream Apply topically. 05/26/20   [provider]  cyclobenzaprine (FLEXERIL) 10 MG tablet Take 1 tablet (10 mg total) 3 (three) times daily as needed by mouth for muscle spasms. Caution of sedation 07/02/17   Tower, Audrie Gallus, MD  Multiple Vitamins-Minerals (MULTIVITAMIN ADULTS 50+ PO) Take 1 capsule by mouth daily.    [provider]  naproxen sodium (ANAPROX) 220 MG tablet Take 440 mg by mouth 2 (two) times daily as needed.    [provider]  triamcinolone cream (KENALOG) 0.1 % Apply 1 application topically 2 (two) times daily. To affected areas 03/03/18   Tower, Audrie Gallus, MD  valACYclovir (VALTREX) 1000 MG tablet TAKE 2 TABLETS BY MOUTH 2 TIMES DAILY ASNEEDED FOR COLD SORES FOR 1 DAY 07/11/20   Tower,  Wynelle Fanny, MD    Family History Family History  Problem Relation Age of Onset  . Seizures Father   . Hyperlipidemia Mother   . Aneurysm Maternal Grandmother   . Hyperlipidemia Maternal Grandmother   . Hypertension Maternal Grandmother   . Colon cancer Neg Hx   . Breast cancer Neg Hx     Social History Social History   Tobacco Use  . Smoking status: Former Research scientist (life sciences)  . Smokeless tobacco: Never Used  Vaping Use  . Vaping Use: Never used  Substance Use Topics  . Alcohol use: Yes    Alcohol/week: 0.0 standard drinks    Comment: wine every once in a while  . Drug use: No     Allergies   Penicillins, Amoxicillin, Atorvastatin, Bactrim  [sulfamethoxazole-trimethoprim], Cephalexin, Mometasone furoate, and Rosuvastatin   Review of Systems Review of Systems   Physical Exam Triage Vital Signs ED Triage Vitals  Enc Vitals Group     BP 08/24/20 1157 129/84     Pulse Rate 08/24/20 1157 87     Resp 08/24/20 1157 16     Temp 08/24/20 1157 99.5 F (37.5 C)     Temp Source 08/24/20 1157 Oral     SpO2 08/24/20 1157 97 %     Weight 08/24/20 1214 167 lb 15.9 oz (76.2 kg)     Height 08/24/20 1214 5' 0.45" (1.535 m)     Head Circumference --      Peak Flow --      Pain Score 08/24/20 1214 0     Pain Loc --      Pain Edu? --      Excl. in [place]? --    No data found.  Updated Vital Signs BP 129/84 (BP Location: Left Arm)   Pulse 87   Temp 99.5 F (37.5 C) (Oral)   Resp 16   Ht 5' 0.45" (1.535 m)   Wt 167 lb 15.9 oz (76.2 kg)   SpO2 97%   BMI 32.32 kg/m   Visual Acuity Right Eye Distance:   Left Eye Distance:   Bilateral Distance:    Right Eye Near:   Left Eye Near:    Bilateral Near:     Physical Exam Vitals and nursing note reviewed.  Constitutional:      General: She is not in acute distress.    Appearance: Normal appearance. She is not ill-appearing, toxic-appearing or diaphoretic.  HENT:     Head: Normocephalic.     Nose: Nose normal.     Mouth/Throat:     Pharynx: Oropharynx is clear.  Eyes:     Conjunctiva/sclera: Conjunctivae normal.  Cardiovascular:     Rate and Rhythm: Normal rate and regular rhythm.  Pulmonary:     Effort: Pulmonary effort is normal.     Breath sounds: Normal breath sounds.  Musculoskeletal:        General: Normal range of motion.     Cervical back: Normal range of motion.  Skin:    General: Skin is warm and dry.     Findings: No rash.  Neurological:     Mental Status: She is alert.  Psychiatric:        Mood and Affect: Mood normal.      UC Treatments / Results  Labs (all labs ordered are listed, but only abnormal results are displayed) Labs Reviewed - No data  to display  EKG   Radiology No results found.  Procedures Procedures (including critical care time)  Medications  Ordered in UC Medications - No data to display  Initial Impression / Assessment and Plan / UC Course  I have reviewed the triage vital signs and the nursing notes.  Pertinent labs & imaging results that were available during my care of the patient were reviewed by me and considered in my medical decision making (see chart for details).     Post viral cough Tessalon Perles as needed She can  continue over-the-counter medicines as needed Follow up as needed for continued or worsening symptoms  Final Clinical Impressions(s) / UC Diagnoses   Final diagnoses:  Post-viral cough syndrome     Discharge Instructions     You can take the tessalon as needed for cough.  Follow up as needed for continued or worsening symptoms      ED Prescriptions    Medication Sig Dispense Auth. Provider   benzonatate (TESSALON) 100 MG capsule  (Status: Discontinued) Take 2 capsules (200 mg total) by mouth every 8 (eight) hours. 21 capsule Sabrinia Prien A, NP   benzonatate (TESSALON) 100 MG capsule Take 2 capsules (200 mg total) by mouth every 8 (eight) hours. Take 1-2 capsules as needed every 8 hours 30 capsule Tanekia Ryans A, NP     PDMP not reviewed this encounter.   Janace Aris, NP 08/24/20 1253

## 2020-09-06 ENCOUNTER — Other Ambulatory Visit: Payer: Self-pay | Admitting: Family Medicine

## 2020-09-06 DIAGNOSIS — Z1231 Encounter for screening mammogram for malignant neoplasm of breast: Secondary | ICD-10-CM

## 2020-09-08 ENCOUNTER — Other Ambulatory Visit: Payer: Self-pay

## 2020-09-08 ENCOUNTER — Telehealth (INDEPENDENT_AMBULATORY_CARE_PROVIDER_SITE_OTHER): Payer: PPO | Admitting: Family Medicine

## 2020-09-08 ENCOUNTER — Encounter: Payer: Self-pay | Admitting: Family Medicine

## 2020-09-08 VITALS — BP 134/74 | HR 78 | Temp 97.4°F | Ht 64.25 in | Wt 162.0 lb

## 2020-09-08 DIAGNOSIS — R053 Chronic cough: Secondary | ICD-10-CM

## 2020-09-08 DIAGNOSIS — M549 Dorsalgia, unspecified: Secondary | ICD-10-CM | POA: Diagnosis not present

## 2020-09-08 DIAGNOSIS — M542 Cervicalgia: Secondary | ICD-10-CM

## 2020-09-08 MED ORDER — AZITHROMYCIN 250 MG PO TABS: ORAL_TABLET | ORAL | 0 refills | Status: AC

## 2020-09-08 MED ORDER — PREDNISONE 20 MG PO TABS: ORAL_TABLET | ORAL | 0 refills | Status: DC

## 2020-09-08 NOTE — Progress Notes (Signed)
Alisha Kimmons T. Jaaron Oleson, MD Primary Care and [place] at [facility] [place], [address] Phone: [phone]  FAX: [place] - 70 y.o. female  MRN [id number]  Date of Birth: [birth year]  Visit Date: 09/08/2020  PCP: Abner Greenspan, MD  Referred by: Tower, Wynelle Fanny, MD  Virtual Visit via Video Note:  I connected with  Alisha Long on 09/08/2020  3:20 PM EST by a video enabled telemedicine application and verified that I am speaking with the correct person using two identifiers.   Location patient: home computer, tablet, or smartphone Location provider: work or home office Consent: Verbal consent directly obtained from [name]. Persons participating in the virtual visit: patient, provider  I discussed the limitations of evaluation and management by telemedicine and the availability of in person appointments. The patient expressed understanding and agreed to proceed.  Chief Complaint  Patient presents with  . Nasal Congestion  . Cough    History of Present Illness:  She presents with some coughing that has been present since early November.  She has had some virtual visits about it, and she has been doing some Mucinex, Zyrtec, Tessalon Perles, and multiple other remedies.  Cough and chest congestion. Add zyrtec to mucinex. Went to UC and added some Best boy.  Not as much congestion and ? A little mucous.  No one else has it in her household or immediate contacts.  She additionally asked me about a number of musculoskeletal conditions, this is through a video visit.  She complains of some intermittent neck pain, and she also has some pain in bilateral shoulder blades.  She also complains of some low back pain, and she also had some what sounds to be some radicular symptoms, but this did go away in short order.  All of these were relatively short-lived.  Early dec had a crick in  her neck. Two weeks had some LBP -  Then R shoulder blade, got better after a few days Then L shoulder blade bothering her  Nyquil.  This does seem to help at nighttime.  No smoking for many years  After about 2 weeks, then did covid test twice By the middle of December, she felt normal.  No GERD. She denies asthma She denies postnasal drip   Review of Systems as above: See pertinent positives and pertinent negatives per HPI No acute distress verbally   Observations/Objective/Exam:  An attempt was made to discern vital signs over the phone and per patient if applicable and possible.   General:    Alert, Oriented, appears well and in no acute distress  Pulmonary:     On inspection no signs of respiratory distress.  Psych / Neurological:     Pleasant and cooperative.  Assessment and Plan:    ICD-10-CM   1. Chronic cough  R05.3   2. Cervicalgia  M54.2   3. Acute back pain, unspecified back location, unspecified back pain laterality  M54.9    Chronic cough.  We reviewed causes of chronic cough and occluding cough variant asthma, postnasal drip, reflux, smoker's cough, and it does not seem that she tends to have any of these.  This is been a 76-month course.  Etiology is not entirely clear.  This is virtual, no examination is possible.  Possible etiologies would include pertussis, long course of atypical pneumonia, other inflammatory process.  I think it is probably reasonable to treat  her for the above, and also place her on some steroids, and hopefully this will open her lungs up.  She has had multiple negative COVID test.  In regards to her neck and back pain she has had some of this before, with flare: this is exceptionally challenging to manage without a physical exam.  I did try to reassure her, and since her symptoms have waxed and waned and last only a small amount of time I am not overly concerned.  There is no way that any of these have to do with her chronic cough  in my opinion.  There is no way that the neck pain or the back pain could be directly caused by the same cause.  I discussed the assessment and treatment plan with the patient. The patient was provided an opportunity to ask questions and all were answered. The patient agreed with the plan and demonstrated an understanding of the instructions.   The patient was advised to call back or seek an in-person evaluation if the symptoms worsen or if the condition fails to improve as anticipated.  Follow-up: prn unless noted otherwise below No follow-ups on file.  Meds ordered this encounter  Medications  . azithromycin (ZITHROMAX) 250 MG tablet    Sig: Take 2 tablets (500 mg total) by mouth daily for 1 day, THEN 1 tablet (250 mg total) daily for 4 days.    Dispense:  6 tablet    Refill:  0  . predniSONE (DELTASONE) 20 MG tablet    Sig: 2 tabs po for 4 days, then 1 tab po for 4 days    Dispense:  12 tablet    Refill:  0   No orders of the defined types were placed in this encounter.   Signed,  Maud Deed. Myia Bergh, MD

## 2020-10-21 ENCOUNTER — Ambulatory Visit: Payer: PPO

## 2020-11-01 ENCOUNTER — Other Ambulatory Visit: Payer: Self-pay

## 2020-11-01 ENCOUNTER — Ambulatory Visit: Payer: PPO | Admitting: Orthopaedic Surgery

## 2020-11-01 ENCOUNTER — Encounter: Payer: Self-pay | Admitting: Orthopaedic Surgery

## 2020-11-01 VITALS — Ht 64.0 in | Wt 162.0 lb

## 2020-11-01 DIAGNOSIS — M170 Bilateral primary osteoarthritis of knee: Secondary | ICD-10-CM | POA: Diagnosis not present

## 2020-11-01 MED ORDER — LIDOCAINE HCL 1 % IJ SOLN
2.0000 mL | INTRAMUSCULAR | Status: AC | PRN
  Administered 2020-11-01: 2 mL

## 2020-11-01 MED ORDER — BUPIVACAINE HCL 0.25 % IJ SOLN
2.0000 mL | INTRAMUSCULAR | Status: AC | PRN
  Administered 2020-11-01: 2 mL via INTRA_ARTICULAR

## 2020-11-01 NOTE — Progress Notes (Signed)
Office Visit Note   Patient: Alisha Long           Date of Birth: 1950-10-26           MRN: [id number] Visit Date: 11/01/2020              Requested by: Tower, Wynelle Fanny, MD [place],  [place] [address] PCP: Abner Greenspan, MD   Assessment & Plan: Visit Diagnoses:  1. Bilateral primary osteoarthritis of knee     Plan: Bilateral knee osteoarthritis.  Is had excellent results with cortisone in the past.  Will inject betamethasone into both knees.  Long discussion regarding treatment options including CBD oil, Voltaren gel and NSAIDs.  Also have discussed viscosupplementation.  She like to consider the above and let me know  Follow-Up Instructions: Return if symptoms worsen or fail to improve.   Orders:  Orders Placed This Encounter  Procedures  . Large Joint Inj: bilateral knee   No orders of the defined types were placed in this encounter.     Procedures: Large Joint Inj: bilateral knee on 11/01/2020 3:59 PM Indications: diagnostic evaluation Details: 25 G 1.5 in needle, anteromedial approach  Arthrogram: No  Medications (Right): 2 mL lidocaine 1 %; 2 mL bupivacaine 0.25 % Medications (Left): 2 mL lidocaine 1 %; 2 mL bupivacaine 0.25 %  12 mg betamethasone injected into both knees with Xylocaine and Marcaine Consent was given by the patient. Immediately prior to procedure a time out was called to verify the correct patient, procedure, equipment, support staff and site/side marked as required. Patient was prepped and draped in the usual sterile fashion.       Clinical Data: No additional findings.   Subjective: Chief Complaint  Patient presents with  . Left Knee - Pain  . Right Knee - Pain  Patient presents today for recurrent chronic bilateral knee pain. She had her knees injected three months ago. She states that they help for about two months. She is wanting to have both knees injected again today. She does wear elastic knee sleeves and  they help offer support.  HPI  Review of Systems   Objective: Vital Signs: Ht 5\' 4"  (1.626 m)   Wt 162 lb (73.5 kg)   BMI 27.81 kg/m   Physical Exam Constitutional:      Appearance: She is well-developed and well-nourished.  HENT:     Mouth/Throat:     Mouth: Oropharynx is clear and moist.  Eyes:     Extraocular Movements: EOM normal.     Pupils: Pupils are equal, round, and reactive to light.  Pulmonary:     Effort: Pulmonary effort is normal.  Skin:    General: Skin is warm and dry.  Neurological:     Mental Status: She is alert and oriented to person, place, and time.  Psychiatric:        Mood and Affect: Mood and affect normal.        Behavior: Behavior normal.     Ortho Exam awake alert and oriented x3.  No distress.  Bilateral knee pain predominate in the medial compartment.  Lacked a few degrees to full extension on the right but not the left and flexed about 105 degrees without instability.  No popliteal pain.  No calf pain bilaterally.  Increased varus bilaterally  Specialty Comments:  No specialty comments available.  Imaging: No results found.   PMFS History: Patient Active Problem List   Diagnosis Date Noted  .  Bilateral primary osteoarthritis of knee 07/26/2020  . Routine general medical examination at a health care facility 07/07/2020  . Medicare annual wellness visit, initial 07/06/2019  . Elevated glucose 07/06/2019  . Lichen planus 74/07/8785  . Oral lichen planus 76/72/0947  . Welcome to Medicare preventive visit 05/21/2017  . Seborrheic keratosis 12/14/2016  . Estrogen deficiency 05/24/2015  . Encounter for routine gynecological examination 04/15/2012  . Well woman exam with routine gynecological exam 04/07/2012  . HEADACHE 11/01/2010  . COLONIC POLYPS 01/06/2010  . POSTNASAL DRIP 01/06/2010  . IRON DEFICIENCY ANEMIA, HX OF 11/07/2009  . OTHER SEBORRHEIC KERATOSIS 07/19/2009  . SYMPTOM, INSOMNIA NOS 06/18/2007  . Hyperlipidemia  06/11/2007   Past Medical History:  Diagnosis Date  . Colon polyps   . History of shingles   . Hyperlipidemia   . Hypertension   . Insomnia   . Palpitations   . Post-nasal drip    with chronic cough    Family History  Problem Relation Age of Onset  . Seizures Father   . Hyperlipidemia Mother   . Aneurysm Maternal Grandmother   . Hyperlipidemia Maternal Grandmother   . Hypertension Maternal Grandmother   . Colon cancer Neg Hx   . Breast cancer Neg Hx     Past Surgical History:  Procedure Laterality Date  . ABDOMINAL HYSTERECTOMY     bleeding, ovaries intact  . BREAST EXCISIONAL BIOPSY Left   . CARPAL TUNNEL RELEASE  11/2004  . CESAREAN SECTION     x 3  . CHOLECYSTECTOMY  12/2005  . COLONOSCOPY     polyps  . ESOPHAGOGASTRODUODENOSCOPY     nml  . gallstones     Abd [place]   . KNEE SURGERY     10/2000  . KNEE SURGERY  01/2001   replaced ACL   Social History   Occupational History  . 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 Smoking status: Former Research scientist (life sciences)  . Smokeless tobacco: Never Used  Vaping Use  . Vaping Use: Never used  Substance and Sexual Activity  . Alcohol use: Yes    Alcohol/week: 0.0 standard drinks    Comment: wine every once in a while  . Drug use: No  . Sexual activity: Not on file

## 2020-12-07 ENCOUNTER — Other Ambulatory Visit: Payer: Self-pay

## 2020-12-07 ENCOUNTER — Ambulatory Visit
Admission: RE | Admit: 2020-12-07 | Discharge: 2020-12-07 | Disposition: A | Discharge status: Home / Self Care | Payer: PPO | Source: Ambulatory Visit | Attending: Family Medicine | Admitting: Family Medicine

## 2020-12-07 DIAGNOSIS — Z1231 Encounter for screening mammogram for malignant neoplasm of breast: Secondary | ICD-10-CM | POA: Diagnosis not present

## 2020-12-14 ENCOUNTER — Telehealth: Payer: Self-pay | Admitting: Orthopaedic Surgery

## 2020-12-14 NOTE — Telephone Encounter (Signed)
Patient called requesting to submit for approval for gel injections for bilateral knees. Please call patient when approved and type of gel injection approval. Patient phone number is [phone].

## 2020-12-16 NOTE — Telephone Encounter (Signed)
Noted  

## 2021-01-20 ENCOUNTER — Telehealth: Payer: Self-pay

## 2021-01-20 NOTE — Telephone Encounter (Signed)
VOB has been submitted for Orthovisc, bilateral knee. Pending BV.

## 2021-01-24 ENCOUNTER — Telehealth: Payer: Self-pay

## 2021-01-24 NOTE — Telephone Encounter (Signed)
Approved for Orthovisc, bilateral knee. [place] Patient will be responsible for 20% OOP. $30.00 Co-pay each visit No PA required

## 2021-02-07 ENCOUNTER — Other Ambulatory Visit: Payer: Self-pay

## 2021-02-07 ENCOUNTER — Ambulatory Visit: Payer: PPO | Admitting: Orthopaedic Surgery

## 2021-02-07 ENCOUNTER — Encounter: Payer: Self-pay | Admitting: Orthopaedic Surgery

## 2021-02-07 VITALS — Ht 64.0 in | Wt 162.0 lb

## 2021-02-07 DIAGNOSIS — M170 Bilateral primary osteoarthritis of knee: Secondary | ICD-10-CM

## 2021-02-07 MED ORDER — HYALURONAN 30 MG/2ML IX SOSY
30.0000 mg | PREFILLED_SYRINGE | INTRA_ARTICULAR | Status: AC | PRN
  Administered 2021-02-07: 16:00:00 30 mg via INTRA_ARTICULAR

## 2021-02-07 NOTE — Progress Notes (Signed)
Office Visit Note   Patient: Alisha Long           Date of Birth: 1950-12-09           MRN: [id number] Visit Date: 02/07/2021              Requested by: Tower, Wynelle Fanny, MD [place],  [place] [address] PCP: Abner Greenspan, MD   Assessment & Plan: Visit Diagnoses:  1. Bilateral primary osteoarthritis of knee     Plan: First Orthovisc injection both knees medial compartment without difficulty  Follow-Up Instructions: Return in about 1 week (around 02/14/2021).   Orders:  Orders Placed This Encounter  Procedures   Large Joint Inj: bilateral knee   No orders of the defined types were placed in this encounter.     Procedures: Large Joint Inj: bilateral knee on 02/07/2021 4:23 PM Indications: pain and joint swelling Details: 25 G 1.5 in needle, anteromedial approach  Arthrogram: No  Medications (Right): 30 mg Hyaluronan 30 MG/2ML Medications (Left): 30 mg Hyaluronan 30 MG/2ML Outcome: tolerated well, no immediate complications Procedure, treatment alternatives, risks and benefits explained, specific risks discussed. Consent was given by the patient. Immediately prior to procedure a time out was called to verify the correct patient, procedure, equipment, support staff and site/side marked as required. Patient was prepped and draped in the usual sterile fashion.      Clinical Data: No additional findings.   Subjective: Chief Complaint  Patient presents with   Right Knee - Follow-up    Bilateral orthovisc started 02/07/2021   Left Knee - Follow-up  Patient presents today for the first bilateral orthovisc injections.  HPI  Review of Systems   Objective: Vital Signs: Ht 5\' 4"  (1.626 m)   Wt 162 lb (73.5 kg)   BMI 27.81 kg/m   Physical Exam  Ortho Exam neither knee was hot red warm or swollen.  No effusion.  Increased varus bilaterally  Specialty Comments:  No specialty comments available.  Imaging: No results found.   PMFS  History: Patient Active Problem List   Diagnosis Date Noted   Bilateral primary osteoarthritis of knee 07/26/2020   Routine general medical examination at a health care facility 07/07/2020   Medicare annual wellness visit, initial 07/06/2019   Elevated glucose 29/93/7169   Lichen planus 67/89/3810   Oral lichen planus 17/51/0258   Welcome to Medicare preventive visit 05/21/2017   Seborrheic keratosis 12/14/2016   Estrogen deficiency 05/24/2015   Encounter for routine gynecological examination 04/15/2012   Well woman exam with routine gynecological exam 04/07/2012   HEADACHE 11/01/2010   COLONIC POLYPS 01/06/2010   POSTNASAL DRIP 01/06/2010   IRON DEFICIENCY ANEMIA, HX OF 11/07/2009   OTHER SEBORRHEIC KERATOSIS 07/19/2009   SYMPTOM, INSOMNIA NOS 06/18/2007   Hyperlipidemia 06/11/2007   Past Medical History:  Diagnosis Date   Colon polyps    History of shingles    Hyperlipidemia    Hypertension    Insomnia    Palpitations    Post-nasal drip    with chronic cough    Family History  Problem Relation Age of Onset   Seizures Father    Hyperlipidemia Mother    Aneurysm Maternal Grandmother    Hyperlipidemia Maternal Grandmother    Hypertension Maternal Grandmother    Colon cancer Neg Hx    Breast cancer Neg Hx     Past Surgical History:  Procedure Laterality Date   ABDOMINAL HYSTERECTOMY     bleeding,  ovaries intact   BREAST EXCISIONAL BIOPSY Left    CARPAL TUNNEL RELEASE  11/2004   CESAREAN SECTION     x 3   CHOLECYSTECTOMY  12/2005   COLONOSCOPY     polyps   ESOPHAGOGASTRODUODENOSCOPY     nml   gallstones     Abd [place]    KNEE SURGERY     10/2000   KNEE SURGERY  01/2001   replaced ACL   Social History   Occupational History   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

## 2021-02-14 ENCOUNTER — Ambulatory Visit: Payer: PPO | Admitting: Family Medicine

## 2021-02-14 ENCOUNTER — Other Ambulatory Visit: Payer: Self-pay

## 2021-02-14 DIAGNOSIS — M1712 Unilateral primary osteoarthritis, left knee: Secondary | ICD-10-CM

## 2021-02-14 DIAGNOSIS — M1711 Unilateral primary osteoarthritis, right knee: Secondary | ICD-10-CM | POA: Diagnosis not present

## 2021-02-14 NOTE — Progress Notes (Signed)
Office Visit Note   Patient: Alisha Long           Date of Birth: 1951-01-28           MRN: [id number] Visit Date: 02/14/2021 Requested by: Tower, Alisha Fanny, MD [place],  [place] [address] PCP: Alisha Greenspan, MD  Subjective: Chief Complaint  Patient presents with   Right Knee - Pain, Follow-up    Orthovisc injections bilateral knees #2 Alisha Long patient). No problems with the first injections.   Left Knee - Pain, Follow-up    HPI: She is here for Orthovisc No. 2 for bilateral knee DJD.  No improvement so far.                ROS:   All other systems were reviewed and are negative.  Objective: Vital Signs: There were no vitals taken for this visit.  Physical Exam:  General:  Alert and oriented, in no acute distress. Pulm:  Breathing unlabored. Psy:  Normal mood, congruent affect.  Knees: She has about 5 degree flexion contracture bilaterally.  Trace effusion with no warmth or erythema.   Imaging: No results found.  Assessment & Plan: Bilateral knee osteoarthritis -Orthovisc No. 2 given today.  Follow-up in a week for the third injection per Dr. Durward Long.     Procedures: Bilateral knee injections: After sterile prep with Betadine, injected 3 cc 1% lidocaine without epinephrine and Orthovisc from lateral midpatellar approach.       PMFS History: Patient Active Problem List   Diagnosis Date Noted   Bilateral primary osteoarthritis of knee 07/26/2020   Routine general medical examination at a health care facility 07/07/2020   Medicare annual wellness visit, initial 07/06/2019   Elevated glucose 23/36/1224   Lichen planus 49/75/3005   Oral lichen planus 06/28/1116   Welcome to Medicare preventive visit 05/21/2017   Seborrheic keratosis 12/14/2016   Estrogen deficiency 05/24/2015   Encounter for routine gynecological examination 04/15/2012   Well woman exam with routine gynecological exam 04/07/2012   HEADACHE 11/01/2010   COLONIC  POLYPS 01/06/2010   POSTNASAL DRIP 01/06/2010   IRON DEFICIENCY ANEMIA, HX OF 11/07/2009   OTHER SEBORRHEIC KERATOSIS 07/19/2009   SYMPTOM, INSOMNIA NOS 06/18/2007   Hyperlipidemia 06/11/2007   Past Medical History:  Diagnosis Date   Colon polyps    History of shingles    Hyperlipidemia    Hypertension    Insomnia    Palpitations    Post-nasal drip    with chronic cough    Family History  Problem Relation Age of Onset   Seizures Father    Hyperlipidemia Mother    Aneurysm Maternal Grandmother    Hyperlipidemia Maternal Grandmother    Hypertension Maternal Grandmother    Colon cancer Neg Hx    Breast cancer Neg Hx     Past Surgical History:  Procedure Laterality Date   ABDOMINAL HYSTERECTOMY     bleeding, ovaries intact   BREAST EXCISIONAL BIOPSY Left    CARPAL TUNNEL RELEASE  11/2004   CESAREAN SECTION     x 3   CHOLECYSTECTOMY  12/2005   COLONOSCOPY     polyps   ESOPHAGOGASTRODUODENOSCOPY     nml   gallstones     Abd [place]    KNEE SURGERY     10/2000   KNEE SURGERY  01/2001   replaced ACL   Social History   Occupational History   Occupation: Arboriculturist: [employer]  Employer: UNEMPLOYED  Tobacco Use   Smoking status: Former    Pack years: 0.00   Smokeless tobacco: Never  Vaping Use   Vaping Use: Never used  Substance and Sexual Activity   Alcohol use: Yes    Alcohol/week: 0.0 standard drinks    Comment: wine every once in a while   Drug use: No   Sexual activity: Not on file

## 2021-02-21 ENCOUNTER — Encounter: Payer: Self-pay | Admitting: Orthopaedic Surgery

## 2021-02-21 ENCOUNTER — Other Ambulatory Visit: Payer: Self-pay

## 2021-02-21 ENCOUNTER — Ambulatory Visit: Payer: PPO | Admitting: Orthopaedic Surgery

## 2021-02-21 VITALS — Ht 64.0 in | Wt 162.0 lb

## 2021-02-21 DIAGNOSIS — M170 Bilateral primary osteoarthritis of knee: Secondary | ICD-10-CM

## 2021-02-21 MED ORDER — HYALURONAN 30 MG/2ML IX SOSY
30.0000 mg | PREFILLED_SYRINGE | INTRA_ARTICULAR | Status: AC | PRN
  Administered 2021-02-21: 30 mg via INTRA_ARTICULAR

## 2021-02-21 NOTE — Progress Notes (Signed)
Office Visit Note   Patient: Alisha Long           Date of Birth: 1950/10/20           MRN: [id number] Visit Date: 02/21/2021              Requested by: Tower, Wynelle Fanny, MD [place],  [place] [address] PCP: Abner Greenspan, MD   Assessment & Plan: Visit Diagnoses:  1. Bilateral primary osteoarthritis of knee     Plan: Third and final Orthovisc injection both knees.  At this point Alisha Long is not sure it is made much of a difference.  Hopefully the third will help.  Have discussed total knee replacement and answered any questions should that be an option in the future  Follow-Up Instructions: Return if symptoms worsen or fail to improve.   Orders:  No orders of the defined types were placed in this encounter.  No orders of the defined types were placed in this encounter.     Procedures: Large Joint Inj: bilateral knee on 02/21/2021 3:45 PM Indications: pain and joint swelling Details: 25 G 1.5 in needle, anteromedial approach  Arthrogram: No  Medications (Right): 30 mg Hyaluronan 30 MG/2ML Medications (Left): 30 mg Hyaluronan 30 MG/2ML Outcome: tolerated well, no immediate complications Procedure, treatment alternatives, risks and benefits explained, specific risks discussed. Consent was given by the patient. Immediately prior to procedure a time out was called to verify the correct patient, procedure, equipment, support staff and site/side marked as required. Patient was prepped and draped in the usual sterile fashion.      Clinical Data: No additional findings.   Subjective: Chief Complaint  Patient presents with   Right Knee - Follow-up    Bilateral orthovisc started 02/07/2021   Left Knee - Follow-up  Patient presents today for the third bilateral orthovisc injections today. She started the series on 02/07/2021.   HPI  Review of Systems   Objective: Vital Signs: Ht 5\' 4"  (1.626 m)   Wt 162 lb (73.5 kg)   BMI 27.81 kg/m    Physical Exam  Ortho Exam increased varus both knees with slight loss of full extension.  Knees were not hot warm or red and without effusion  Specialty Comments:  No specialty comments available.  Imaging: No results found.   PMFS History: Patient Active Problem List   Diagnosis Date Noted   Bilateral primary osteoarthritis of knee 07/26/2020   Routine general medical examination at a health care facility 07/07/2020   Medicare annual wellness visit, initial 07/06/2019   Elevated glucose 47/82/9562   Lichen planus 13/03/6577   Oral lichen planus 46/96/2952   Welcome to Medicare preventive visit 05/21/2017   Seborrheic keratosis 12/14/2016   Estrogen deficiency 05/24/2015   Encounter for routine gynecological examination 04/15/2012   Well woman exam with routine gynecological exam 04/07/2012   HEADACHE 11/01/2010   COLONIC POLYPS 01/06/2010   POSTNASAL DRIP 01/06/2010   IRON DEFICIENCY ANEMIA, HX OF 11/07/2009   OTHER SEBORRHEIC KERATOSIS 07/19/2009   SYMPTOM, INSOMNIA NOS 06/18/2007   Hyperlipidemia 06/11/2007   Past Medical History:  Diagnosis Date   Colon polyps    History of shingles    Hyperlipidemia    Hypertension    Insomnia    Palpitations    Post-nasal drip    with chronic cough    Family History  Problem Relation Age of Onset   Seizures Father    Hyperlipidemia Mother  Aneurysm Maternal Grandmother    Hyperlipidemia Maternal Grandmother    Hypertension Maternal Grandmother    Colon cancer Neg Hx    Breast cancer Neg Hx     Past Surgical History:  Procedure Laterality Date   ABDOMINAL HYSTERECTOMY     bleeding, ovaries intact   BREAST EXCISIONAL BIOPSY Left    CARPAL TUNNEL RELEASE  11/2004   CESAREAN SECTION     x 3   CHOLECYSTECTOMY  12/2005   COLONOSCOPY     polyps   ESOPHAGOGASTRODUODENOSCOPY     nml   gallstones     Abd [place]    KNEE SURGERY     10/2000   KNEE SURGERY  01/2001   replaced ACL   Social History   Occupational  History   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

## 2021-04-26 DIAGNOSIS — H04123 Dry eye syndrome of bilateral lacrimal glands: Secondary | ICD-10-CM | POA: Diagnosis not present

## 2021-04-26 DIAGNOSIS — H43813 Vitreous degeneration, bilateral: Secondary | ICD-10-CM | POA: Diagnosis not present

## 2021-04-26 DIAGNOSIS — H52203 Unspecified astigmatism, bilateral: Secondary | ICD-10-CM | POA: Diagnosis not present

## 2021-04-26 DIAGNOSIS — H26493 Other secondary cataract, bilateral: Secondary | ICD-10-CM | POA: Diagnosis not present

## 2021-05-30 DIAGNOSIS — L821 Other seborrheic keratosis: Secondary | ICD-10-CM | POA: Diagnosis not present

## 2021-05-30 DIAGNOSIS — Z85828 Personal history of other malignant neoplasm of skin: Secondary | ICD-10-CM | POA: Diagnosis not present

## 2021-05-30 DIAGNOSIS — B353 Tinea pedis: Secondary | ICD-10-CM | POA: Diagnosis not present

## 2021-05-30 DIAGNOSIS — L570 Actinic keratosis: Secondary | ICD-10-CM | POA: Diagnosis not present

## 2021-05-30 DIAGNOSIS — L718 Other rosacea: Secondary | ICD-10-CM | POA: Diagnosis not present

## 2021-05-30 DIAGNOSIS — L814 Other melanin hyperpigmentation: Secondary | ICD-10-CM | POA: Diagnosis not present

## 2021-05-30 DIAGNOSIS — D2262 Melanocytic nevi of left upper limb, including shoulder: Secondary | ICD-10-CM | POA: Diagnosis not present

## 2021-05-30 DIAGNOSIS — B351 Tinea unguium: Secondary | ICD-10-CM | POA: Diagnosis not present

## 2021-05-30 DIAGNOSIS — L820 Inflamed seborrheic keratosis: Secondary | ICD-10-CM | POA: Diagnosis not present

## 2021-06-04 ENCOUNTER — Other Ambulatory Visit: Payer: Self-pay | Admitting: Family Medicine

## 2021-06-05 NOTE — Telephone Encounter (Signed)
Acute virtual visit done on 08/24/20, no future appts., last filled on 07/11/20 #12 tabs with 3 refills

## 2021-06-07 ENCOUNTER — Other Ambulatory Visit: Payer: Self-pay

## 2021-06-07 ENCOUNTER — Ambulatory Visit: Payer: PPO | Admitting: Orthopaedic Surgery

## 2021-06-07 ENCOUNTER — Encounter: Payer: Self-pay | Admitting: Orthopaedic Surgery

## 2021-06-07 DIAGNOSIS — M170 Bilateral primary osteoarthritis of knee: Secondary | ICD-10-CM

## 2021-06-07 NOTE — Progress Notes (Signed)
Office Visit Note   Patient: Alisha Long           Date of Birth: 07/26/1951           MRN: [id number] Visit Date: 06/07/2021              Requested by: Tower, Wynelle Fanny, MD [place],  [place] [address] PCP: Abner Greenspan, MD   Assessment & Plan: Visit Diagnoses:  1. Bilateral primary osteoarthritis of knee     Plan: Mrs. Roskelley is decided she like to proceed with a right total knee replacement.  She has had viscosupplementation and cortisone in the past with only temporary relief.  She is reached a point where she is significantly compromised in her daily activities.  She also notes that she is becoming more more "bowlegged".  Prior films have demonstrated end-stage arthritis of both of her knees with bone-on-bone in the medial compartment.  We had a long discussion regarding the surgery.  We discussed the technique, anesthesia and potential complications.  She would like to proceed.  We will give her clearance forms and schedule him sometime in November if possible.  Follow-Up Instructions: Return We will schedule right total knee replacement.   Orders:  No orders of the defined types were placed in this encounter.  No orders of the defined types were placed in this encounter.     Procedures: No procedures performed   Clinical Data: No additional findings.   Subjective: Chief Complaint  Patient presents with   Right Knee - Follow-up   Left Knee - Follow-up  Patient presents today for follow up on her right knee. She received bilateral gel injections earlier this year. She said that they help for about two months. She is wanting to discuss total knee replacement on the right side.  HPI  Review of Systems   Objective: Vital Signs: There were no vitals taken for this visit.  Physical Exam Constitutional:      Appearance: She is well-developed.  Pulmonary:     Effort: Pulmonary effort is normal.  Skin:    General: Skin is warm and dry.   Neurological:     Mental Status: She is alert and oriented to person, place, and time.  Psychiatric:        Behavior: Behavior normal.    Ortho Exam awake alert and oriented x3.  Comfortable sitting in.  No acute distress.  Right knee lacks about 5 degrees to full extension of flexed about 102 to 3 degrees.  No opening with varus or valgus stress.  Negative anterior drawer sign.  The knee was not hot red warm or swollen.  No effusion.  Increased varus with predominantly medial joint pain.  Very little lateral joint pain.  Some patella crepitation but no pain with compression.  Specialty Comments:  No specialty comments available.  Imaging: No results found.   PMFS History: Patient Active Problem List   Diagnosis Date Noted   Bilateral primary osteoarthritis of knee 07/26/2020   Routine general medical examination at a health care facility 07/07/2020   Medicare annual wellness visit, initial 07/06/2019   Elevated glucose 30/86/5784   Lichen planus 69/62/9528   Oral lichen planus 41/32/4401   Welcome to Medicare preventive visit 05/21/2017   Seborrheic keratosis 12/14/2016   Estrogen deficiency 05/24/2015   Encounter for routine gynecological examination 04/15/2012   Well woman exam with routine gynecological exam 04/07/2012   HEADACHE 11/01/2010   COLONIC POLYPS 01/06/2010  POSTNASAL DRIP 01/06/2010   IRON DEFICIENCY ANEMIA, HX OF 11/07/2009   OTHER SEBORRHEIC KERATOSIS 07/19/2009   SYMPTOM, INSOMNIA NOS 06/18/2007   Hyperlipidemia 06/11/2007   Past Medical History:  Diagnosis Date   Colon polyps    History of shingles    Hyperlipidemia    Hypertension    Insomnia    Palpitations    Post-nasal drip    with chronic cough    Family History  Problem Relation Age of Onset   Seizures Father    Hyperlipidemia Mother    Aneurysm Maternal Grandmother    Hyperlipidemia Maternal Grandmother    Hypertension Maternal Grandmother    Colon cancer Neg Hx    Breast cancer  Neg Hx     Past Surgical History:  Procedure Laterality Date   ABDOMINAL HYSTERECTOMY     bleeding, ovaries intact   BREAST EXCISIONAL BIOPSY Left    CARPAL TUNNEL RELEASE  11/2004   CESAREAN SECTION     x 3   CHOLECYSTECTOMY  12/2005   COLONOSCOPY     polyps   ESOPHAGOGASTRODUODENOSCOPY     nml   gallstones     Abd [place]    KNEE SURGERY     10/2000   KNEE SURGERY  01/2001   replaced ACL   Social History   Occupational History   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

## 2021-07-31 NOTE — Progress Notes (Signed)
Subjective:   Alisha Long is a 70 y.o. female who presents for Medicare Annual (Subsequent) preventive examination.  I connected with Alisha Long today by telephone and verified that I am speaking with the correct person using two identifiers. Location patient: home Location provider: work Persons participating in the virtual visit: patient, Marine scientist.    I discussed the limitations, risks, security and privacy concerns of performing an evaluation and management service by telephone and the availability of in person appointments. I also discussed with the patient that there may be a patient responsible charge related to this service. The patient expressed understanding and verbally consented to this telephonic visit.    Interactive audio and video telecommunications were attempted between this provider and patient, however failed, due to patient having technical difficulties OR patient did not have access to video capability.  We continued and completed visit with audio only.  Some vital signs may be absent or patient reported.   Time Spent with patient on telephone encounter: 20 minutes  Review of Systems     Cardiac Risk Factors include: advanced age (>62men, >42 women);dyslipidemia     Objective:    Today's Vitals   08/02/21 0813  Weight: 162 lb (73.5 kg)  Height: 5\' 4"  (1.626 m)   Body mass index is 27.81 kg/m.  Advanced Directives 08/02/2021 07/29/2020 05/23/2018 07/31/2016 07/17/2016  Does Patient Have a Medical Advance Directive? Yes Yes Yes Yes Yes  Type of Paramedic of [place];Living will [place];Living will Living will - -  Does patient want to make changes to medical advance directive? Yes (ED - Information included in AVS) - - - -  Copy of Healthcare Power of Attorney in Chart? - No - copy requested - - -  Would patient like information on creating a medical advance directive? - - No - Patient declined - -     Current Medications (verified) Outpatient Encounter Medications as of 08/02/2021  Medication Sig   ciclopirox (LOPROX) 0.77 % cream Apply topically.   clindamycin (CLEOCIN T) 1 % lotion Apply topically every morning.   cyclobenzaprine (FLEXERIL) 10 MG tablet Take 1 tablet (10 mg total) 3 (three) times daily as needed by mouth for muscle spasms. Caution of sedation   Multiple Vitamins-Minerals (MULTIVITAMIN ADULTS 50+ PO) Take 1 capsule by mouth daily.   naproxen sodium (ANAPROX) 220 MG tablet Take 440 mg by mouth 2 (two) times daily as needed.   RESTASIS 0.05 % ophthalmic emulsion 1 drop 2 (two) times daily.   triamcinolone cream (KENALOG) 0.1 % Apply 1 application topically 2 (two) times daily. To affected areas   valACYclovir (VALTREX) 1000 MG tablet TAKE 2 TABLETS BY MOUTH 2 TIMES DAILY ASNEEDED FOR COLD SORES FOR 1 DAY   No facility-administered encounter medications on file as of 08/02/2021.    Allergies (verified) Penicillins, Amoxicillin, Atorvastatin, Bactrim [sulfamethoxazole-trimethoprim], Cephalexin, Mometasone furoate, and Rosuvastatin   History: Past Medical History:  Diagnosis Date   Colon polyps    History of shingles    Hyperlipidemia    Hypertension    Insomnia    Palpitations    Post-nasal drip    with chronic cough   Past Surgical History:  Procedure Laterality Date   ABDOMINAL HYSTERECTOMY     bleeding, ovaries intact   BREAST EXCISIONAL BIOPSY Left    CARPAL TUNNEL RELEASE  11/2004   CESAREAN SECTION     x 3   CHOLECYSTECTOMY  12/2005   COLONOSCOPY  polyps   ESOPHAGOGASTRODUODENOSCOPY     nml   gallstones     Abd [place]    KNEE SURGERY     10/2000   KNEE SURGERY  01/2001   replaced ACL   Family History  Problem Relation Age of Onset   Seizures Father    Hyperlipidemia Mother    Aneurysm Maternal Grandmother    Hyperlipidemia Maternal Grandmother    Hypertension Maternal Grandmother    Colon cancer Neg Hx    Breast cancer Neg Hx     Social History   Socioeconomic History   Marital status: Married    Spouse name: Not on file   Number of children: 3   Years of education: Not on file   Highest education level: Not on file  Occupational History   Occupation: Housewife    Employer: [employer], retail: UNEMPLOYED  Tobacco Use   Smoking status: Former   Smokeless tobacco: Never  Scientific laboratory technician Use: Never used  Substance and Sexual Activity   Alcohol use: Yes    Alcohol/week: 0.0 standard drinks    Comment: wine every once in a while   Drug use: No   Sexual activity: Not on file  Other Topics Concern   Not on file  Social History Narrative   Married, 3 children. Works for [employer], gets regular exercise.    Social Determinants of Health   Financial Resource Strain: Low Risk    Difficulty of Paying Living Expenses: Not hard at all  Food Insecurity: No Food Insecurity   Worried About Charity fundraiser in the Last Year: Never true   [place] in the Last Year: Never true  Transportation Needs: No Transportation Needs   Lack of Transportation (Medical): No   Lack of Transportation (Non-Medical): No  Physical Activity: Sufficiently Active   Days of Exercise per Week: 7 days   Minutes of Exercise per Session: 60 min  Stress: No Stress Concern Present   Feeling of Stress : Not at all  Social Connections: Socially Integrated   Frequency of Communication with Friends and Family: More than three times a week   Frequency of Social Gatherings with Friends and Family: More than three times a week   Attends Religious Services: More than 4 times per year   Active Member of [employer] or Organizations: Yes   Attends Music therapist: More than 4 times per year   Marital Status: Married    Tobacco Counseling Counseling given: Not Answered   Clinical Intake:  Pre-visit preparation completed: Yes  Pain : No/denies pain     BMI - recorded: 27.79 Nutritional  Status: BMI 25 -29 Overweight Nutritional Risks: None Diabetes: No  How often do you need to have someone help you when you read instructions, pamphlets, or other written materials from your doctor or pharmacy?: 1 - Never  Diabetic?No  Interpreter Needed?: No  Information entered by :: Orrin Brigham LPN   Activities of Daily Living In your present state of health, do you have any difficulty performing the following activities: 08/02/2021  Hearing? N  Vision? N  Difficulty concentrating or making decisions? N  Walking or climbing stairs? Y  Comment having knee replacement surgery  Dressing or bathing? N  Doing errands, shopping? Y  Preparing Food and eating ? N  Using the Toilet? N  In the past six months, have you accidently leaked urine? N  Do you have problems  with loss of bowel control? N  Managing your Medications? N  Managing your Finances? N  Housekeeping or managing your Housekeeping? N  Some recent data might be hidden    Patient Care Team: [place], Wynelle Fanny, MD as PCP - General  Indicate any recent Medical Services you may have received from other than [facility] providers in the past year (date may be approximate).     Assessment:   This is a routine wellness examination for [place].  Hearing/Vision screen Hearing Screening - Comments:: No issues Vision Screening - Comments:: Last exam 01/2021, Dr Satira Sark, wears readers  Dietary issues and exercise activities discussed: Current Exercise Habits: Home exercise routine, Type of exercise: strength training/weights, Time (Minutes): 60, Frequency (Times/Week): 7, Weekly Exercise (Minutes/Week): 420, Intensity: Intense   Goals Addressed             This Visit's Progress    Patient Stated       Would like to continue current  health status       Depression Screen PHQ 2/9 Scores 08/02/2021 07/29/2020 07/11/2020 07/06/2019 05/23/2018 05/21/2017  PHQ - 2 Score 0 0 0 0 0 0  PHQ- 9 Score - 0 - - 0 -    Fall Risk Fall  Risk  08/02/2021 07/29/2020 07/11/2020 03/22/2020 07/06/2019  Falls in the past year? 0 0 0 0 0  Comment - - - Emmi Telephone Survey: data to providers prior to load -  Number falls in past yr: 0 0 - - -  Injury with Fall? 0 0 - - -  Risk for fall due to : No Fall Risks No Fall Risks History of fall(s) - -  Follow up Falls prevention discussed Falls evaluation completed;Falls prevention discussed - - Falls evaluation completed    FALL RISK PREVENTION PERTAINING TO THE HOME:  Any stairs in or around the home? Yes  If so, are there any without handrails? No  Home free of loose throw rugs in walkways, pet beds, electrical cords, etc? Yes  Adequate lighting in your home to reduce risk of falls? Yes   ASSISTIVE DEVICES UTILIZED TO PREVENT FALLS:  Life alert? No  Use of a cane, walker or w/c? No  Grab bars in the bathroom? No  Shower chair or bench in shower? Yes  Elevated toilet seat or a handicapped toilet? Yes   TIMED UP AND GO:  Was the test performed? No , visit completed over the phone.    Cognitive Function: Normal cognitive status assessed by this Nurse Health Advisor. No abnormalities found.   MMSE - Mini Mental State Exam 07/29/2020 05/23/2018  Orientation to time 5 5  Orientation to Place 5 5  Registration 3 3  Attention/ Calculation 5 0  Recall 3 3  Language- name 2 objects - 0  Language- repeat 1 1  Language- follow 3 step command - 3  Language- read & follow direction - 0  Write a sentence - 0  Copy design - 0  Total score - 20        Immunizations Immunization History  Administered Date(s) Administered   Fluad Quad(high Dose 65+) 07/06/2019, 07/11/2020   Influenza,inj,Quad PF,6+ Mos 06/30/2014, 05/13/2015, 05/14/2016, 05/21/2017, 05/23/2018   Influenza-Unspecified 08/01/2021   PFIZER(Purple Top)SARS-COV-2 Vaccination 10/01/2019, 10/22/2019, 06/01/2020   Pneumococcal Conjugate-13 05/21/2017   Pneumococcal Polysaccharide-23 05/23/2018   Td 04/27/2003,  09/09/2013   Tdap 05/14/2016   Zoster, Live 05/06/2012    TDAP status: Up to date  Flu Vaccine status: Up to date  Pneumococcal vaccine status: Up to date  Covid-19 vaccine status: Information provided on how to obtain vaccines.   Qualifies for Shingles Vaccine? Yes   Zostavax completed Yes   Shingrix Completed?: No.    Education has been provided regarding the importance of this vaccine. Patient has been advised to call insurance company to determine out of pocket expense if they have not yet received this vaccine. Advised may also receive vaccine at local pharmacy or Health Dept. Verbalized acceptance and understanding.  Screening Tests Health Maintenance  Topic Date Due   Zoster Vaccines- Shingrix (1 of 2) Never done   COVID-19 Vaccine (4 - Booster for Pfizer series) 07/27/2020   COLONOSCOPY (Pts 45-44yrs Insurance coverage will need to be confirmed)  07/31/2021   MAMMOGRAM  12/07/2021   TETANUS/TDAP  05/14/2026   Pneumonia Vaccine 66+ Years old  Completed   INFLUENZA VACCINE  Completed   DEXA SCAN  Completed   Hepatitis C Screening  Completed   HPV VACCINES  Aged Out    Health Maintenance  Health Maintenance Due  Topic Date Due   Zoster Vaccines- Shingrix (1 of 2) Never done   COVID-19 Vaccine (4 - Booster for [place] series) 07/27/2020   COLONOSCOPY (Pts 45-22yrs Insurance coverage will need to be confirmed)  07/31/2021    Colorectal cancer screening: Type of screening: Colonoscopy. Completed 07/31/16. Repeat every 5 years  Mammogram status: Completed 12/07/20. Repeat every year  Bone Density status: Ordered 08/02/21. Pt provided with contact info and advised to call to schedule appt.  Lung Cancer Screening: (Low Dose CT Chest recommended if Age 64-80 years, 30 pack-year currently smoking OR have quit w/in 15years.) does not qualify.     Additional Screening:  Hepatitis C Screening: does qualify; Completed 05/14/16  Vision Screening: Recommended annual  ophthalmology exams for early detection of glaucoma and other disorders of the eye. Is the patient up to date with their annual eye exam?  Yes  Who is the provider or what is the name of the office in which the patient attends annual eye exams? Dr. Satira Sark    Dental Screening: Recommended annual dental exams for proper oral hygiene  Community Resource Referral / Chronic Care Management: CRR required this visit?  No   CCM required this visit?  No      Plan:     I have personally reviewed and noted the following in the patient's chart:   Medical and social history Use of alcohol, tobacco or illicit drugs  Current medications and supplements including opioid prescriptions.  Functional ability and status Nutritional status Physical activity Advanced directives List of other physicians Hospitalizations, surgeries, and ER visits in previous 12 months Vitals Screenings to include cognitive, depression, and falls Referrals and appointments  In addition, I have reviewed and discussed with patient certain preventive protocols, quality metrics, and best practice recommendations. A written personalized care plan for preventive services as well as general preventive health recommendations were provided to patient.   Due to this being a telephonic visit, the after visit summary with patients personalized plan was offered to patient via mail or my-chart.  Patient would like to access on my-chart.   Loma Messing, LPN   [id number]   Nurse Health Advisor  Nurse Notes: none

## 2021-08-02 ENCOUNTER — Ambulatory Visit (INDEPENDENT_AMBULATORY_CARE_PROVIDER_SITE_OTHER): Payer: PPO

## 2021-08-02 VITALS — Ht 64.0 in | Wt 162.0 lb

## 2021-08-02 DIAGNOSIS — Z780 Asymptomatic menopausal state: Secondary | ICD-10-CM

## 2021-08-02 DIAGNOSIS — Z1211 Encounter for screening for malignant neoplasm of colon: Secondary | ICD-10-CM | POA: Diagnosis not present

## 2021-08-02 DIAGNOSIS — Z0000 Encounter for general adult medical examination without abnormal findings: Secondary | ICD-10-CM | POA: Diagnosis not present

## 2021-08-02 NOTE — Patient Instructions (Signed)
Alisha Long , Thank you for taking time to complete your Medicare Wellness Visit. I appreciate your ongoing commitment to your health goals. Please review the following plan we discussed and let me know if I can assist you in the future.   Screening recommendations/referrals: Colonoscopy: due, last completed 07/31/16, ordered today someone will call to schedule an appointment Mammogram: up to date, completed 12/07/20, due 12/07/21 Bone Density: due, last completed 06/30/15, ordered today , someone will call to schedule an appointment Recommended yearly ophthalmology/optometry visit for glaucoma screening and checkup Recommended yearly dental visit for hygiene and checkup  Vaccinations: Influenza vaccine: up to date Pneumococcal vaccine: up to date Tdap vaccine: up to date, completed 05/14/16, due 05/14/26 Shingles vaccine: Discuss with your local pharmacy   Covid-19: newest booster available at your local pharmacy  Advanced directives: Please bring a copy of Living Will and/or Healthcare Power of Attorney for your chart.   Conditions/risks identified: see problem list  Next appointment: Follow up in one year for your annual wellness visit 08/06/22 @ 8:15am, this will be a telephone visit   Preventive Care 65 Years and Older, Female Preventive care refers to lifestyle choices and visits with your health care provider that can promote health and wellness. What does preventive care include? A yearly physical exam. This is also called an annual well check. Dental exams once or twice a year. Routine eye exams. Ask your health care provider how often you should have your eyes checked. Personal lifestyle choices, including: Daily care of your teeth and gums. Regular physical activity. Eating a healthy diet. Avoiding tobacco and drug use. Limiting alcohol use. Practicing safe sex. Taking low-dose aspirin every day. Taking vitamin and mineral supplements as recommended by your health care  provider. What happens during an annual well check? The services and screenings done by your health care provider during your annual well check will depend on your age, overall health, lifestyle risk factors, and family history of disease. Counseling  Your health care provider may ask you questions about your: Alcohol use. Tobacco use. Drug use. Emotional well-being. Home and relationship well-being. Sexual activity. Eating habits. History of falls. Memory and ability to understand (cognition). Work and work Statistician. Reproductive health. Screening  You may have the following tests or measurements: Height, weight, and BMI. Blood pressure. Lipid and cholesterol levels. These may be checked every 5 years, or more frequently if you are over 59 years old. Skin check. Lung cancer screening. You may have this screening every year starting at age 31 if you have a 30-pack-year history of smoking and currently smoke or have quit within the past 15 years. Fecal occult blood test (FOBT) of the stool. You may have this test every year starting at age 80. Flexible sigmoidoscopy or colonoscopy. You may have a sigmoidoscopy every 5 years or a colonoscopy every 10 years starting at age 103. Hepatitis C blood test. Hepatitis B blood test. Sexually transmitted disease (STD) testing. Diabetes screening. This is done by checking your blood sugar (glucose) after you have not eaten for a while (fasting). You may have this done every 1-3 years. Bone density scan. This is done to screen for osteoporosis. You may have this done starting at age 33. Mammogram. This may be done every 1-2 years. Talk to your health care provider about how often you should have regular mammograms. Talk with your health care provider about your test results, treatment options, and if necessary, the need for more tests. Vaccines  Your health  care provider may recommend certain vaccines, such as: Influenza vaccine. This is  recommended every year. Tetanus, diphtheria, and acellular pertussis (Tdap, Td) vaccine. You may need a Td booster every 10 years. Zoster vaccine. You may need this after age 75. Pneumococcal 13-valent conjugate (PCV13) vaccine. One dose is recommended after age 84. Pneumococcal polysaccharide (PPSV23) vaccine. One dose is recommended after age 61. Talk to your health care provider about which screenings and vaccines you need and how often you need them. This information is not intended to replace advice given to you by your health care provider. Make sure you discuss any questions you have with your health care provider. Document Released: 09/09/2015 Document Revised: 05/02/2016 Document Reviewed: 06/14/2015 Elsevier Interactive Patient Education  2017 [place] Prevention in the Home Falls can cause injuries. They can happen to people of all ages. There are many things you can do to make your home safe and to help prevent falls. What can I do on the outside of my home? Regularly fix the edges of walkways and driveways and fix any cracks. Remove anything that might make you trip as you walk through a door, such as a raised step or threshold. Trim any bushes or trees on the path to your home. Use bright outdoor lighting. Clear any walking paths of anything that might make someone trip, such as rocks or tools. Regularly check to see if handrails are loose or broken. Make sure that both sides of any steps have handrails. Any raised decks and porches should have guardrails on the edges. Have any leaves, snow, or ice cleared regularly. Use sand or salt on walking paths during winter. Clean up any spills in your garage right away. This includes oil or grease spills. What can I do in the bathroom? Use night lights. Install grab bars by the toilet and in the tub and shower. Do not use towel bars as grab bars. Use non-skid mats or decals in the tub or shower. If you need to sit down in  the shower, use a plastic, non-slip stool. Keep the floor dry. Clean up any water that spills on the floor as soon as it happens. Remove soap buildup in the tub or shower regularly. Attach bath mats securely with double-sided non-slip rug tape. Do not have throw rugs and other things on the floor that can make you trip. What can I do in the bedroom? Use night lights. Make sure that you have a light by your bed that is easy to reach. Do not use any sheets or blankets that are too big for your bed. They should not hang down onto the floor. Have a firm chair that has side arms. You can use this for support while you get dressed. Do not have throw rugs and other things on the floor that can make you trip. What can I do in the kitchen? Clean up any spills right away. Avoid walking on wet floors. Keep items that you use a lot in easy-to-reach places. If you need to reach something above you, use a strong step stool that has a grab bar. Keep electrical cords out of the way. Do not use floor polish or wax that makes floors slippery. If you must use wax, use non-skid floor wax. Do not have throw rugs and other things on the floor that can make you trip. What can I do with my stairs? Do not leave any items on the stairs. Make sure that there are handrails on  both sides of the stairs and use them. Fix handrails that are broken or loose. Make sure that handrails are as long as the stairways. Check any carpeting to make sure that it is firmly attached to the stairs. Fix any carpet that is loose or worn. Avoid having throw rugs at the top or bottom of the stairs. If you do have throw rugs, attach them to the floor with carpet tape. Make sure that you have a light switch at the top of the stairs and the bottom of the stairs. If you do not have them, ask someone to add them for you. What else can I do to help prevent falls? Wear shoes that: Do not have high heels. Have rubber bottoms. Are comfortable  and fit you well. Are closed at the toe. Do not wear sandals. If you use a stepladder: Make sure that it is fully opened. Do not climb a closed stepladder. Make sure that both sides of the stepladder are locked into place. Ask someone to hold it for you, if possible. Clearly mark and make sure that you can see: Any grab bars or handrails. First and last steps. Where the edge of each step is. Use tools that help you move around (mobility aids) if they are needed. These include: Canes. Walkers. Scooters. Crutches. Turn on the lights when you go into a dark area. Replace any light bulbs as soon as they burn out. Set up your furniture so you have a clear path. Avoid moving your furniture around. If any of your floors are uneven, fix them. If there are any pets around you, be aware of where they are. Review your medicines with your doctor. Some medicines can make you feel dizzy. This can increase your chance of falling. Ask your doctor what other things that you can do to help prevent falls. This information is not intended to replace advice given to you by your health care provider. Make sure you discuss any questions you have with your health care provider. Document Released: 06/09/2009 Document Revised: 01/19/2016 Document Reviewed: 09/17/2014 Elsevier Interactive Patient Education  2017 [employer].

## 2021-08-03 ENCOUNTER — Encounter: Payer: Self-pay | Admitting: *Deleted

## 2021-08-04 ENCOUNTER — Encounter: Payer: PPO | Admitting: Family Medicine

## 2021-08-07 ENCOUNTER — Encounter: Payer: Self-pay | Admitting: Family Medicine

## 2021-08-07 ENCOUNTER — Other Ambulatory Visit: Payer: Self-pay

## 2021-08-07 ENCOUNTER — Ambulatory Visit (INDEPENDENT_AMBULATORY_CARE_PROVIDER_SITE_OTHER): Payer: PPO | Admitting: Family Medicine

## 2021-08-07 VITALS — BP 138/84 | HR 73 | Temp 97.9°F | Ht 64.0 in | Wt 163.4 lb

## 2021-08-07 DIAGNOSIS — R7309 Other abnormal glucose: Secondary | ICD-10-CM

## 2021-08-07 DIAGNOSIS — B001 Herpesviral vesicular dermatitis: Secondary | ICD-10-CM | POA: Insufficient documentation

## 2021-08-07 DIAGNOSIS — Z0000 Encounter for general adult medical examination without abnormal findings: Secondary | ICD-10-CM | POA: Diagnosis not present

## 2021-08-07 DIAGNOSIS — E7800 Pure hypercholesterolemia, unspecified: Secondary | ICD-10-CM | POA: Diagnosis not present

## 2021-08-07 DIAGNOSIS — E2839 Other primary ovarian failure: Secondary | ICD-10-CM | POA: Diagnosis not present

## 2021-08-07 DIAGNOSIS — R0982 Postnasal drip: Secondary | ICD-10-CM

## 2021-08-07 DIAGNOSIS — R053 Chronic cough: Secondary | ICD-10-CM | POA: Diagnosis not present

## 2021-08-07 MED ORDER — CYCLOBENZAPRINE HCL 10 MG PO TABS: 10.0000 mg | ORAL_TABLET | Freq: Three times a day (TID) | ORAL | 0 refills | Status: DC | PRN

## 2021-08-07 NOTE — Assessment & Plan Note (Signed)
This is similar to last year/acute on chronic Reassuring exam  protonix did not help   (nor did zpak and prednisone a year ago) Taking tussin and delsym  Adv to try going back on claritin and also trial of pepcid otc (she clears throat frequently in the room) May need further eval/tx if not improved

## 2021-08-07 NOTE — Patient Instructions (Addendum)
Try claritin daily for the cough  Try and prop up to sleep   If you are interested in the new shingles vaccine (Shingrix) - call your local pharmacy to check on coverage and availability  If affordable, get on a wait list at your pharmacy to get the vaccine.  Get your colonoscopy scheduled when you can   You can try pepcid over the counter 10 to 20 mg daily for cough  Try it for 2 weeks  Keep [place] posted   For bone density -call and schedule at the Breast center of [facility]   Please call the location of your choice from the menu below to schedule your Mammogram and/or Bone Density appointment.    [facility]                      Phone:  [phone], [place] [address]                                                             Services: Traditional and 3D Mammogram, [facility]                 Phone: [phone] [address], [place] [address]    Service: Bone Density ONLY   *this site does NOT perform mammograms  [facility]                        Phone:  [phone] [address], [place] [address]                                            Services:  3D Mammogram and [place] at [facility]   Phone:  [phone]   [place] [address] [place], [place] [address]  Services: 3D Mammogram and Bone Density  [place] [facility])  Phone:  [phone]   [address] [place], [place] [address]                                              Services:   3D Mammogram and Bone Density

## 2021-08-07 NOTE — Assessment & Plan Note (Signed)
Disc goals for lipids and reasons to control them Rev last labs with pt Rev low sat fat diet in detail Diet is fairly good  Lipid panel ordered  In past intolerant of atorvastatin and rosuvastatin

## 2021-08-07 NOTE — Assessment & Plan Note (Signed)
May fuel her cough  Adv to re start claritin daily and see if this helps

## 2021-08-07 NOTE — Progress Notes (Signed)
Subjective:    Patient ID: Domingo Dimes, female    DOB: Sep 08, 1950, 70 y.o.   MRN: [id number]  This visit occurred during the SARS-CoV-2 public health emergency.  Safety protocols were in place, including screening questions prior to the visit, additional usage of staff PPE, and extensive cleaning of exam room while observing appropriate contact time as indicated for disinfecting solutions.   HPI Here for health maintenance exam and to review chronic medical problems    Wt Readings from Last 3 Encounters:  08/07/21 163 lb 6 oz (74.1 kg)  08/02/21 162 lb (73.5 kg)  02/21/21 162 lb (73.5 kg)   28.04 kg/m  Eating well  Does upper body exercise  Knee pain limits her - so uses the bike   Planning knee surgery  Dr Maureen Ralphs  May be in January  Local anesthesia probably  Has had the gel shots Will do R knee first    Has been feeling pretty good except for some chronic cough  Not as bad as it was last year  Taking cough med and cough drops  Using a humidifier  Tussin and delsym Not productive  Talking makes it worse   Last year when it started/zithromax and prednisone did not help (jan of 2022)  Went to urgent care in [place] where she got tessalon pearles  Also tried protonix -did not help  Then got her gallbladder taken out   No heartburn or acid reflux symptoms    Amw was done earlier this month and reviewed today   Zoster status -had zostavax in 2013 -unsure if she wants shingirx  Covid immunized  Tdap 2017 Flu shot -had this month  Pna vaccine completed    Mammogram 11/2020  Self breast exam-no lumps but had soreness in L lateral breast then that totally resolved   Dexa 06/2015 -normal bmd  Falls -one on wet floor, no injury Fractures-none  Supplements -mvi Exercise -bike /upper body work  Colonoscopy 07/2016 and due for 5 y recall  Referral has been done   BP Readings from Last 3 Encounters:  08/07/21 138/84  09/08/20 134/74  08/24/20 129/84    Pulse Readings from Last 3 Encounters:  08/07/21 73  09/08/20 78  08/24/20 87     H/o elevated glucose Lab Results  Component Value Date   HGBA1C 5.8 07/07/2020   Due for labs\ Diet is good    Hyperlipidemia Lab Results  Component Value Date   CHOL 201 (H) 07/07/2020   HDL 56.70 07/07/2020   LDLCALC 113 (H) 07/07/2020   LDLDIRECT 154.0 05/25/2019   TRIG 156.0 (H) 07/07/2020   CHOLHDL 4 07/07/2020   Intolerant of atorvastatin and rosuvastatin   Gets a lot of cold sores-takes valcyclovir as needed   Patient Active Problem List   Diagnosis Date Noted   Chronic cough 08/07/2021   Bilateral primary osteoarthritis of knee 07/26/2020   Routine general medical examination at a health care facility 07/07/2020   Medicare annual wellness visit, initial 07/06/2019   Elevated glucose 15/72/6203   Lichen planus 55/97/4163   Oral lichen planus 84/53/6468   Welcome to Medicare preventive visit 05/21/2017   Seborrheic keratosis 12/14/2016   Estrogen deficiency 05/24/2015   Encounter for routine gynecological examination 04/15/2012   Well woman exam with routine gynecological exam 04/07/2012   HEADACHE 11/01/2010   COLONIC POLYPS 01/06/2010   POSTNASAL DRIP 01/06/2010   IRON DEFICIENCY ANEMIA, HX OF 11/07/2009   OTHER SEBORRHEIC KERATOSIS 07/19/2009   SYMPTOM, INSOMNIA  NOS 06/18/2007   Hyperlipidemia 06/11/2007   Past Medical History:  Diagnosis Date   Colon polyps    History of shingles    Hyperlipidemia    Hypertension    Insomnia    Palpitations    Post-nasal drip    with chronic cough   Past Surgical History:  Procedure Laterality Date   ABDOMINAL HYSTERECTOMY     bleeding, ovaries intact   BREAST EXCISIONAL BIOPSY Left    CARPAL TUNNEL RELEASE  11/2004   CESAREAN SECTION     x 3   CHOLECYSTECTOMY  12/2005   COLONOSCOPY     polyps   ESOPHAGOGASTRODUODENOSCOPY     nml   gallstones     Abd [place]    KNEE SURGERY     10/2000   KNEE SURGERY  01/2001    replaced ACL   Social History   Tobacco Use   Smoking status: Former   Smokeless tobacco: Never  Vaping Use   Vaping Use: Never used  Substance Use Topics   Alcohol use: Yes    Alcohol/week: 0.0 standard drinks    Comment: wine every once in a while   Drug use: No   Family History  Problem Relation Age of Onset   Seizures Father    Hyperlipidemia Mother    Aneurysm Maternal Grandmother    Hyperlipidemia Maternal Grandmother    Hypertension Maternal Grandmother    Colon cancer Neg Hx    Breast cancer Neg Hx    Allergies  Allergen Reactions   Penicillins Shortness Of Breath    REACTION: hives   Amoxicillin     REACTION: hives   Atorvastatin     REACTION: joint pain   Bactrim [Sulfamethoxazole-Trimethoprim] Hives   Cephalexin     REACTION: hives   Mometasone Furoate     REACTION: headache   Rosuvastatin     REACTION: headache   Current Outpatient Medications on File Prior to Visit  Medication Sig Dispense Refill   Acetaminophen (TYLENOL 8 HOUR ARTHRITIS PAIN PO) Take 1 tablet by mouth 2 (two) times daily at 8 am and 10 pm.     ciclopirox (LOPROX) 0.77 % cream Apply topically.     CINNAMON PO Take 1,000 mg by mouth every other day.     clindamycin (CLEOCIN T) 1 % lotion Apply topically every morning.     Multiple Vitamins-Minerals (MULTIVITAMIN ADULTS 50+ PO) Take 1 capsule by mouth daily.     RESTASIS 0.05 % ophthalmic emulsion 1 drop 2 (two) times daily.     triamcinolone cream (KENALOG) 0.1 % Apply 1 application topically 2 (two) times daily. To affected areas 30 g 0   valACYclovir (VALTREX) 1000 MG tablet TAKE 2 TABLETS BY MOUTH 2 TIMES DAILY ASNEEDED FOR COLD SORES FOR 1 DAY 12 tablet 3   No current facility-administered medications on file prior to visit.     Review of Systems  Constitutional:  Negative for activity change, appetite change, fatigue, fever and unexpected weight change.  HENT:  Negative for congestion, ear pain, rhinorrhea, sinus pressure and  sore throat.   Eyes:  Negative for pain, redness and visual disturbance.  Respiratory:  Positive for cough. Negative for shortness of breath and wheezing.   Cardiovascular:  Negative for chest pain and palpitations.  Gastrointestinal:  Negative for abdominal pain, blood in stool, constipation and diarrhea.  Endocrine: Negative for polydipsia and polyuria.  Genitourinary:  Negative for dysuria, frequency and urgency.  Musculoskeletal:  Negative for arthralgias, back  pain and myalgias.  Skin:  Negative for pallor and rash.  Allergic/Immunologic: Negative for environmental allergies.  Neurological:  Negative for dizziness, syncope and headaches.  Hematological:  Negative for adenopathy. Does not bruise/bleed easily.  Psychiatric/Behavioral:  Negative for decreased concentration and dysphoric mood. The patient is not nervous/anxious.       Objective:   Physical Exam Constitutional:      General: She is not in acute distress.    Appearance: Normal appearance. She is well-developed and normal weight. She is not ill-appearing or diaphoretic.  HENT:     Head: Normocephalic and atraumatic.     Right Ear: Tympanic membrane, ear canal and external ear normal.     Left Ear: Tympanic membrane, ear canal and external ear normal.     Nose: Nose normal. No congestion.     Mouth/Throat:     Mouth: Mucous membranes are moist.     Pharynx: Oropharynx is clear. No posterior oropharyngeal erythema.     Comments: Pt clears her throat frequently Eyes:     General: No scleral icterus.    Extraocular Movements: Extraocular movements intact.     Conjunctiva/sclera: Conjunctivae normal.     Pupils: Pupils are equal, round, and reactive to light.  Neck:     Thyroid: No thyromegaly.     Vascular: No carotid bruit or JVD.  Cardiovascular:     Rate and Rhythm: Normal rate and regular rhythm.     Pulses: Normal pulses.     Heart sounds: Normal heart sounds.    No gallop.  Pulmonary:     Effort: Pulmonary  effort is normal. No respiratory distress.     Breath sounds: Normal breath sounds. No wheezing.     Comments: Good air exch Chest:     Chest wall: No tenderness.  Abdominal:     General: Bowel sounds are normal. There is no distension or abdominal bruit.     Palpations: Abdomen is soft. There is no mass.     Tenderness: There is no abdominal tenderness.     Hernia: No hernia is present.  Genitourinary:    Comments: Breast exam: No mass, nodules, thickening, tenderness, bulging, retraction, inflamation, nipple discharge or skin changes noted.  No axillary or clavicular LA.     Musculoskeletal:        General: No tenderness. Normal range of motion.     Cervical back: Normal range of motion and neck supple. No rigidity. No muscular tenderness.     Right lower leg: No edema.     Left lower leg: No edema.     Comments: No kyphosis   Knee pain with walking  Lymphadenopathy:     Cervical: No cervical adenopathy.  Skin:    General: Skin is warm and dry.     Coloration: Skin is not pale.     Findings: No erythema or rash.     Comments: Solar lentigines diffusely Scattered SKs and solar aging  Neurological:     Mental Status: She is alert. Mental status is at baseline.     Cranial Nerves: No cranial nerve deficit.     Motor: No abnormal muscle tone.     Coordination: Coordination normal.     Gait: Gait normal.     Deep Tendon Reflexes: Reflexes are normal and symmetric. Reflexes normal.  Psychiatric:        Mood and Affect: Mood normal.        Cognition and Memory: Cognition and memory normal.  Comments: Cognitively sharp          Assessment & Plan:   Problem List Items Addressed This Visit       Digestive   Recurrent cold sores    Takes valcyclovir 2 g bid as needed         Other   Hyperlipidemia    Disc goals for lipids and reasons to control them Rev last labs with pt Rev low sat fat diet in detail Diet is fairly good  Lipid panel ordered  In past  intolerant of atorvastatin and rosuvastatin       Relevant Orders   Lipid panel   POSTNASAL DRIP    May fuel her cough  Adv to re start claritin daily and see if this helps      Estrogen deficiency   Relevant Orders   DG Bone Density   Elevated glucose    A1C ordered disc imp of low glycemic diet and wt loss to prevent DM2        Relevant Orders   Hemoglobin A1c   Routine general medical examination at a health care facility - Primary    Reviewed health habits including diet and exercise and skin cancer prevention Reviewed appropriate screening tests for age  Also reviewed health mt list, fam hx and immunization status , as well as social and family history   See HPI Reviewed reassuring amw  Labs ordered May be having knee repl this year  Dealing with chronic cough  Mammogram utd Colonoscopy referral done for 5 y recall dexa ordered -no fractures/discussed fall prev      Relevant Orders   CBC with Differential/Platelet   Comprehensive metabolic panel   TSH   Chronic cough    This is similar to last year/acute on chronic Reassuring exam  protonix did not help   (nor did zpak and prednisone a year ago) Taking tussin and delsym  Adv to try going back on claritin and also trial of pepcid otc (she clears throat frequently in the room) May need further eval/tx if not improved

## 2021-08-07 NOTE — Assessment & Plan Note (Signed)
Takes valcyclovir 2 g bid as needed

## 2021-08-07 NOTE — Assessment & Plan Note (Addendum)
Reviewed health habits including diet and exercise and skin cancer prevention Reviewed appropriate screening tests for age  Also reviewed health mt list, fam hx and immunization status , as well as social and family history   See HPI Reviewed reassuring amw  Labs ordered May be having knee repl this year  Dealing with chronic cough  Mammogram utd Colonoscopy referral done for 5 y recall dexa ordered -no fractures/discussed fall prev

## 2021-08-07 NOTE — Assessment & Plan Note (Signed)
A1C ordered °disc imp of low glycemic diet and wt loss to prevent DM2  °

## 2021-08-08 LAB — LIPID PANEL
Cholesterol: 227 mg/dL — ABNORMAL HIGH (ref 0–200)
HDL: 53.6 mg/dL (ref >39.00)
LDL Cholesterol: 139 mg/dL — ABNORMAL HIGH (ref 0–99)
NonHDL: 173.52
Total CHOL/HDL Ratio: 4
Triglycerides: 171 mg/dL — ABNORMAL HIGH (ref 0.0–149.0)
VLDL: 34.2 mg/dL (ref 0.0–40.0)

## 2021-08-08 LAB — COMPREHENSIVE METABOLIC PANEL
ALT: 17 U/L (ref 0–35)
AST: 22 U/L (ref 0–37)
Albumin: 4.3 g/dL (ref 3.5–5.2)
Alkaline Phosphatase: 113 U/L (ref 39–117)
BUN: 11 mg/dL (ref 6–23)
CO2: 28 mEq/L (ref 19–32)
Calcium: 10.7 mg/dL — ABNORMAL HIGH (ref 8.4–10.5)
Chloride: 103 mEq/L (ref 96–112)
Creatinine, Ser: 0.68 mg/dL (ref 0.40–1.20)
GFR: 88.91 mL/min (ref >60.00)
Glucose, Bld: 94 mg/dL (ref 70–99)
Potassium: 4 mEq/L (ref 3.5–5.1)
Sodium: 139 mEq/L (ref 135–145)
Total Bilirubin: 0.6 mg/dL (ref 0.2–1.2)
Total Protein: 6.6 g/dL (ref 6.0–8.3)

## 2021-08-08 LAB — CBC WITH DIFFERENTIAL/PLATELET
Basophils Absolute: 0 10*3/uL (ref 0.0–0.1)
Basophils Relative: 0.5 % (ref 0.0–3.0)
Eosinophils Absolute: 0.1 10*3/uL (ref 0.0–0.7)
Eosinophils Relative: 1.1 % (ref 0.0–5.0)
HCT: 41.6 % (ref 36.0–46.0)
Hemoglobin: 13.9 g/dL (ref 12.0–15.0)
Lymphocytes Relative: 28.4 % (ref 12.0–46.0)
Lymphs Abs: 2.3 10*3/uL (ref 0.7–4.0)
MCHC: 33.6 g/dL (ref 30.0–36.0)
MCV: 94.9 fl (ref 78.0–100.0)
Monocytes Absolute: 0.5 10*3/uL (ref 0.1–1.0)
Monocytes Relative: 6.9 % (ref 3.0–12.0)
Neutro Abs: 5 10*3/uL (ref 1.4–7.7)
Neutrophils Relative %: 63.1 % (ref 43.0–77.0)
Platelets: 183 10*3/uL (ref 150.0–400.0)
RBC: 4.38 Mil/uL (ref 3.87–5.11)
RDW: 12.9 % (ref 11.5–15.5)
WBC: 8 10*3/uL (ref 4.0–10.5)

## 2021-08-08 LAB — HEMOGLOBIN A1C: Hgb A1c MFr Bld: 5.7 % (ref 4.6–6.5)

## 2021-08-08 LAB — TSH: TSH: 1.76 u[IU]/mL (ref 0.35–5.50)

## 2021-08-11 DIAGNOSIS — M1711 Unilateral primary osteoarthritis, right knee: Secondary | ICD-10-CM | POA: Diagnosis not present

## 2021-08-11 DIAGNOSIS — M1712 Unilateral primary osteoarthritis, left knee: Secondary | ICD-10-CM | POA: Diagnosis not present

## 2021-08-11 DIAGNOSIS — M170 Bilateral primary osteoarthritis of knee: Secondary | ICD-10-CM | POA: Diagnosis not present

## 2021-08-22 ENCOUNTER — Telehealth: Payer: Self-pay | Admitting: *Deleted

## 2021-08-22 NOTE — Telephone Encounter (Signed)
Received surgical clearance forms from [facility].  Per Dr. Glori Bickers pt needs surgical clearance appt  "Will need ekg and some additional labs"  Please schedule when able

## 2021-08-23 NOTE — Telephone Encounter (Signed)
Called and scheduled pt for 09/13/21

## 2021-09-13 ENCOUNTER — Other Ambulatory Visit: Payer: Self-pay

## 2021-09-13 ENCOUNTER — Encounter: Payer: Self-pay | Admitting: Family Medicine

## 2021-09-13 ENCOUNTER — Ambulatory Visit (INDEPENDENT_AMBULATORY_CARE_PROVIDER_SITE_OTHER): Payer: PPO | Admitting: Family Medicine

## 2021-09-13 VITALS — BP 131/85 | HR 74 | Temp 97.9°F | Ht 64.0 in | Wt 162.4 lb

## 2021-09-13 DIAGNOSIS — Z01818 Encounter for other preprocedural examination: Secondary | ICD-10-CM | POA: Insufficient documentation

## 2021-09-13 DIAGNOSIS — M170 Bilateral primary osteoarthritis of knee: Secondary | ICD-10-CM

## 2021-09-13 DIAGNOSIS — Z01810 Encounter for preprocedural cardiovascular examination: Secondary | ICD-10-CM

## 2021-09-13 LAB — POC URINALSYSI DIPSTICK (AUTOMATED)
Bilirubin, UA: NEGATIVE
Blood, UA: NEGATIVE
Glucose, UA: NEGATIVE
Ketones, UA: NEGATIVE
Leukocytes, UA: NEGATIVE
Nitrite, UA: NEGATIVE
Protein, UA: NEGATIVE
Spec Grav, UA: 1.015 (ref 1.010–1.025)
Urobilinogen, UA: 0.2 E.U./dL
pH, UA: 6.5 (ref 5.0–8.0)

## 2021-09-13 NOTE — Progress Notes (Signed)
Subjective:    Patient ID: Alisha Long, female    DOB: 1951-08-07, 71 y.o.   MRN: [id number]  This visit occurred during the SARS-CoV-2 public health emergency.  Safety protocols were in place, including screening questions prior to the visit, additional usage of staff PPE, and extensive cleaning of exam room while observing appropriate contact time as indicated for disinfecting solutions.   HPI Pt presents for surgical clearance for right TKA  Wt Readings from Last 3 Encounters:  09/13/21 162 lb 6 oz (73.7 kg)  08/07/21 163 lb 6 oz (74.1 kg)  08/02/21 162 lb (73.5 kg)   27.87 kg/m   Planning R knee replacement with Dr.  Maureen Ralphs Scheduled for march 7  May have spinal anesthesia   Has had surgery in the past  No complications except for antibiotic allergy with a hysterectomy  Does get some nausea with anesthesia in the past-usually get med for that  No topical allergies     No cardiac or pulmonary problems in the past   Med allergies : PCN and sulfa and cephalosporin drugs cause hives   BP Readings from Last 3 Encounters:  09/13/21 131/85  08/07/21 138/84  09/08/20 134/74    Pulse Readings from Last 3 Encounters:  09/13/21 74  08/07/21 73  09/08/20 78  Pulse ox 97% on RA  EKG: NSR rate of 80 No acute changes   Lab Results  Component Value Date   WBC 8.0 08/07/2021   HGB 13.9 08/07/2021   HCT 41.6 08/07/2021   MCV 94.9 08/07/2021   PLT 183.0 08/07/2021   Lab Results  Component Value Date   CREATININE 0.68 08/07/2021   BUN 11 08/07/2021   NA 139 08/07/2021   K 4.0 08/07/2021   CL 103 08/07/2021   CO2 28 08/07/2021   Lab Results  Component Value Date   HGBA1C 5.7 08/07/2021    Will need albumin and PT/INR  and  ua checked today   Results for orders placed or performed in visit on 09/13/21  POCT Urinalysis Dipstick (Automated)  Result Value Ref Range   Color, UA Light Yellow    Clarity, UA Clear    Glucose, UA Negative Negative    Bilirubin, UA Negative    Ketones, UA Negative    Spec Grav, UA 1.015 1.010 - 1.025   Blood, UA Negative    pH, UA 6.5 5.0 - 8.0   Protein, UA Negative Negative   Urobilinogen, UA 0.2 0.2 or 1.0 E.U./dL   Nitrite, UA Negative    Leukocytes, UA Negative Negative    Patient Active Problem List   Diagnosis Date Noted   Pre-operative clearance 09/13/2021   Chronic cough 08/07/2021   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   Bilateral primary osteoarthritis of knee 07/26/2020   Routine general medical examination at a health care facility 07/07/2020   Medicare annual wellness visit, initial 07/06/2019   Elevated glucose 81/44/8185   Lichen planus 63/14/9702   Oral lichen planus 63/78/5885   Welcome to Medicare preventive visit 05/21/2017   Seborrheic keratosis 12/14/2016   Estrogen deficiency 05/24/2015   Encounter for routine gynecological examination 04/15/2012   Well woman exam with routine gynecological exam 04/07/2012   HEADACHE 11/01/2010   COLONIC POLYPS 01/06/2010   POSTNASAL DRIP 01/06/2010   IRON DEFICIENCY ANEMIA, HX OF 11/07/2009   OTHER SEBORRHEIC KERATOSIS 07/19/2009   SYMPTOM, INSOMNIA NOS 06/18/2007   Hyperlipidemia 06/11/2007   Past Medical History:  Diagnosis Date   Colon polyps  History of shingles    Hyperlipidemia    Hypertension    Insomnia    Palpitations    Post-nasal drip    with chronic cough   Past Surgical History:  Procedure Laterality Date   ABDOMINAL HYSTERECTOMY     bleeding, ovaries intact   BREAST EXCISIONAL BIOPSY Left    CARPAL TUNNEL RELEASE  11/2004   CESAREAN SECTION     x 3   CHOLECYSTECTOMY  12/2005   COLONOSCOPY     polyps   ESOPHAGOGASTRODUODENOSCOPY     nml   gallstones     Abd [place]    KNEE SURGERY     10/2000   KNEE SURGERY  01/2001   replaced ACL   Social History   Tobacco Use   Smoking status: Former   Smokeless tobacco: Never  Vaping Use   Vaping Use: Never used  Substance Use Topics   Alcohol use: Yes     Alcohol/week: 0.0 standard drinks    Comment: wine every once in a while   Drug use: No   Family History  Problem Relation Age of Onset   Seizures Father    Hyperlipidemia Mother    Aneurysm Maternal Grandmother    Hyperlipidemia Maternal Grandmother    Hypertension Maternal Grandmother    Colon cancer Neg Hx    Breast cancer Neg Hx    Allergies  Allergen Reactions   Penicillins Shortness Of Breath    REACTION: hives   Amoxicillin     REACTION: hives   Atorvastatin     REACTION: joint pain   Bactrim [Sulfamethoxazole-Trimethoprim] Hives   Cephalexin     REACTION: hives   Mometasone Furoate     REACTION: headache   Rosuvastatin     REACTION: headache   Current Outpatient Medications on File Prior to Visit  Medication Sig Dispense Refill   Acetaminophen (TYLENOL 8 HOUR ARTHRITIS PAIN PO) Take 1 tablet by mouth 2 (two) times daily at 8 am and 10 pm.     ciclopirox (LOPROX) 0.77 % cream Apply topically.     CINNAMON PO Take 1,000 mg by mouth every other day.     clindamycin (CLEOCIN T) 1 % lotion Apply topically every morning.     cyclobenzaprine (FLEXERIL) 10 MG tablet Take 1 tablet (10 mg total) by mouth 3 (three) times daily as needed for muscle spasms. Caution of sedation 30 tablet 0   Multiple Vitamins-Minerals (MULTIVITAMIN ADULTS 50+ PO) Take 1 capsule by mouth daily.     RESTASIS 0.05 % ophthalmic emulsion 1 drop 2 (two) times daily.     triamcinolone cream (KENALOG) 0.1 % Apply 1 application topically 2 (two) times daily. To affected areas 30 g 0   valACYclovir (VALTREX) 1000 MG tablet TAKE 2 TABLETS BY MOUTH 2 TIMES DAILY ASNEEDED FOR COLD SORES FOR 1 DAY 12 tablet 3   No current facility-administered medications on file prior to visit.    Review of Systems  Constitutional:  Negative for activity change, appetite change, fatigue, fever and unexpected weight change.  HENT:  Negative for congestion, ear pain, rhinorrhea, sinus pressure and sore throat.   Eyes:   Negative for pain, redness and visual disturbance.  Respiratory:  Negative for cough, shortness of breath and wheezing.   Cardiovascular:  Negative for chest pain and palpitations.  Gastrointestinal:  Negative for abdominal pain, blood in stool, constipation and diarrhea.  Endocrine: Negative for polydipsia and polyuria.  Genitourinary:  Negative for dysuria, frequency and urgency.  Musculoskeletal:  Positive for arthralgias. Negative for back pain and myalgias.  Skin:  Negative for pallor and rash.  Allergic/Immunologic: Negative for environmental allergies.  Neurological:  Negative for dizziness, syncope and headaches.  Hematological:  Negative for adenopathy. Does not bruise/bleed easily.  Psychiatric/Behavioral:  Negative for decreased concentration and dysphoric mood. The patient is not nervous/anxious.       Objective:   Physical Exam Constitutional:      General: She is not in acute distress.    Appearance: Normal appearance. She is well-developed and normal weight. She is not ill-appearing or diaphoretic.  HENT:     Head: Normocephalic and atraumatic.  Eyes:     Conjunctiva/sclera: Conjunctivae normal.     Pupils: Pupils are equal, round, and reactive to light.  Neck:     Thyroid: No thyromegaly.     Vascular: No carotid bruit or JVD.  Cardiovascular:     Rate and Rhythm: Normal rate and regular rhythm.     Heart sounds: Normal heart sounds.    No gallop.  Pulmonary:     Effort: Pulmonary effort is normal. No respiratory distress.     Breath sounds: Normal breath sounds. No wheezing or rales.  Abdominal:     General: Abdomen is flat. There is no distension or abdominal bruit.     Palpations: There is no mass.     Tenderness: There is no abdominal tenderness.  Musculoskeletal:     Cervical back: Normal range of motion and neck supple.     Right lower leg: No edema.     Left lower leg: No edema.  Lymphadenopathy:     Cervical: No cervical adenopathy.  Skin:     General: Skin is warm and dry.     Coloration: Skin is not pale.     Findings: No rash.  Neurological:     Mental Status: She is alert.     Coordination: Coordination normal.     Deep Tendon Reflexes: Reflexes are normal and symmetric. Reflexes normal.  Psychiatric:        Mood and Affect: Mood normal.          Assessment & Plan:   Problem List Items Addressed This Visit       Musculoskeletal and Integument   Bilateral primary osteoarthritis of knee (Chronic)    Planning R TKA        Other   Pre-operative clearance - Primary    Pt appears to be low risk for upcoming right TKA  Discussed surgical hx and h/o nausea with anesthesia  Reviewed med/abx allergies No cardiac or pulmonary problems  Nl vitals and exam  Nl EKG Reviewed labs done  Ordered albumin and PT/INR and ua is clear  No further recommendations before surgery       Relevant Orders   Albumin   Protime-INR   POCT Urinalysis Dipstick (Automated) (Completed)   Other Visit Diagnoses     Pre-operative cardiovascular examination       Relevant Orders   EKG 12-Lead (Completed)

## 2021-09-13 NOTE — Patient Instructions (Signed)
Take care of yourself   Labs today  Urinalysis today   If all is normal then no restrictions for surgery

## 2021-09-13 NOTE — Assessment & Plan Note (Signed)
Planning R TKA

## 2021-09-13 NOTE — Assessment & Plan Note (Signed)
Pt appears to be low risk for upcoming right TKA  Discussed surgical hx and h/o nausea with anesthesia  Reviewed med/abx allergies No cardiac or pulmonary problems  Nl vitals and exam  Nl EKG Reviewed labs done  Ordered albumin and PT/INR and ua is clear  No further recommendations before surgery

## 2021-09-14 LAB — PROTIME-INR
INR: 0.9 ratio (ref 0.8–1.0)
Prothrombin Time: 10.2 s (ref 9.6–13.1)

## 2021-09-14 LAB — ALBUMIN: Albumin: 4.3 g/dL (ref 3.5–5.2)

## 2021-10-31 DIAGNOSIS — G8918 Other acute postprocedural pain: Secondary | ICD-10-CM | POA: Diagnosis not present

## 2021-10-31 DIAGNOSIS — M1711 Unilateral primary osteoarthritis, right knee: Secondary | ICD-10-CM | POA: Diagnosis not present

## 2021-11-02 DIAGNOSIS — M25661 Stiffness of right knee, not elsewhere classified: Secondary | ICD-10-CM | POA: Diagnosis not present

## 2021-11-06 DIAGNOSIS — M25661 Stiffness of right knee, not elsewhere classified: Secondary | ICD-10-CM | POA: Diagnosis not present

## 2021-11-08 DIAGNOSIS — M25661 Stiffness of right knee, not elsewhere classified: Secondary | ICD-10-CM | POA: Diagnosis not present

## 2021-11-10 DIAGNOSIS — M25661 Stiffness of right knee, not elsewhere classified: Secondary | ICD-10-CM | POA: Diagnosis not present

## 2021-11-13 DIAGNOSIS — M25661 Stiffness of right knee, not elsewhere classified: Secondary | ICD-10-CM | POA: Diagnosis not present

## 2021-11-15 DIAGNOSIS — M25661 Stiffness of right knee, not elsewhere classified: Secondary | ICD-10-CM | POA: Diagnosis not present

## 2021-11-17 DIAGNOSIS — M25661 Stiffness of right knee, not elsewhere classified: Secondary | ICD-10-CM | POA: Diagnosis not present

## 2021-11-20 DIAGNOSIS — M25661 Stiffness of right knee, not elsewhere classified: Secondary | ICD-10-CM | POA: Diagnosis not present

## 2021-11-22 DIAGNOSIS — M25661 Stiffness of right knee, not elsewhere classified: Secondary | ICD-10-CM | POA: Diagnosis not present

## 2021-11-27 DIAGNOSIS — M1711 Unilateral primary osteoarthritis, right knee: Secondary | ICD-10-CM | POA: Diagnosis not present

## 2021-11-29 ENCOUNTER — Encounter: Payer: Self-pay | Admitting: Gastroenterology

## 2021-11-29 DIAGNOSIS — M25661 Stiffness of right knee, not elsewhere classified: Secondary | ICD-10-CM | POA: Diagnosis not present

## 2021-12-07 DIAGNOSIS — Z5189 Encounter for other specified aftercare: Secondary | ICD-10-CM | POA: Diagnosis not present

## 2021-12-07 DIAGNOSIS — M1712 Unilateral primary osteoarthritis, left knee: Secondary | ICD-10-CM | POA: Diagnosis not present

## 2021-12-14 ENCOUNTER — Other Ambulatory Visit: Payer: PPO

## 2022-01-01 ENCOUNTER — Telehealth: Payer: Self-pay | Admitting: Family Medicine

## 2022-01-01 NOTE — Telephone Encounter (Signed)
Name: Barbee Shropshire from [facility] called for this pt, wanting to speak with the nurse about a medication. I could barely hear or understand the person over the phone.  ? ?Callback Number: [phone] ?

## 2022-01-02 NOTE — Telephone Encounter (Signed)
She takes it for cold sores ?Let her know and see if she is open to acyclovir, then I will send it  ?

## 2022-01-02 NOTE — Telephone Encounter (Signed)
Looks like PA for Valtrex was submitted but wont be approved unless pt has tried and failed acyclovir, please advise  ? ?Form placed in inbox for review  ?

## 2022-01-04 ENCOUNTER — Other Ambulatory Visit: Payer: PPO

## 2022-01-04 MED ORDER — ACYCLOVIR 400 MG PO TABS: 400.0000 mg | ORAL_TABLET | Freq: Three times a day (TID) | ORAL | 3 refills | Status: AC

## 2022-01-04 NOTE — Telephone Encounter (Signed)
Px sent

## 2022-01-04 NOTE — Telephone Encounter (Signed)
Pt notified and she is okay trying the acyclovir, pt advised if she tries and fails it to let us know, please send in new Rx to ? ?[facility]/ [address] ?

## 2022-01-08 NOTE — Telephone Encounter (Signed)
Acyclovir (covered alt med) was sent in and pt aware  ?

## 2022-01-15 ENCOUNTER — Other Ambulatory Visit: Payer: Self-pay | Admitting: Family Medicine

## 2022-01-15 DIAGNOSIS — Z1231 Encounter for screening mammogram for malignant neoplasm of breast: Secondary | ICD-10-CM

## 2022-01-16 ENCOUNTER — Other Ambulatory Visit: Payer: PPO

## 2022-01-24 ENCOUNTER — Ambulatory Visit: Payer: PPO

## 2022-01-29 ENCOUNTER — Other Ambulatory Visit: Payer: Self-pay | Admitting: Family Medicine

## 2022-01-29 DIAGNOSIS — E2839 Other primary ovarian failure: Secondary | ICD-10-CM

## 2022-01-30 ENCOUNTER — Ambulatory Visit
Admission: RE | Admit: 2022-01-30 | Discharge: 2022-01-30 | Disposition: A | Discharge status: Home / Self Care | Payer: PPO | Source: Ambulatory Visit | Attending: Family Medicine | Admitting: Family Medicine

## 2022-01-30 DIAGNOSIS — Z1231 Encounter for screening mammogram for malignant neoplasm of breast: Secondary | ICD-10-CM | POA: Diagnosis not present

## 2022-01-30 DIAGNOSIS — Z780 Asymptomatic menopausal state: Secondary | ICD-10-CM | POA: Diagnosis not present

## 2022-01-30 DIAGNOSIS — E2839 Other primary ovarian failure: Secondary | ICD-10-CM

## 2022-02-22 ENCOUNTER — Encounter: Payer: Self-pay | Admitting: Family

## 2022-02-22 ENCOUNTER — Ambulatory Visit (INDEPENDENT_AMBULATORY_CARE_PROVIDER_SITE_OTHER): Payer: PPO | Admitting: Family

## 2022-02-22 VITALS — BP 132/70 | HR 85 | Temp 98.3°F | Resp 16 | Ht 64.0 in | Wt 150.1 lb

## 2022-02-22 DIAGNOSIS — L723 Sebaceous cyst: Secondary | ICD-10-CM

## 2022-02-22 MED ORDER — DOXYCYCLINE HYCLATE 100 MG PO TABS: 100.0000 mg | ORAL_TABLET | Freq: Two times a day (BID) | ORAL | 0 refills | Status: AC

## 2022-02-22 NOTE — Assessment & Plan Note (Signed)
Posterior left ear, rx  RX doxycycline 100 mg po bid x 10 days Warm compresses to site Pt wants I&D advised pt that would likely resolve with antbx however recommend general surgeon Pt defers referral at this time, she will call her dermatology office.

## 2022-02-22 NOTE — Progress Notes (Signed)
Established Patient Office Visit  Subjective:  Patient ID: Alisha Long, female    DOB: 1951-02-15  Age: 71 y.o. MRN: [id number]  CC:  Chief Complaint  Patient presents with   Cyst    Behind the left ear X 2 weeks hurt pt can not sleep on left side.    HPI Alisha Long is here today with concerns.   Cyst posterior ear with tenderness to touch over the last two weeks, not improving hard to sleep on that side. Wearing hot compresses which helps slightly but not completely.   No fever no chills.  No ear pain internally.   Past Medical History:  Diagnosis Date   Colon polyps    History of shingles    Hyperlipidemia    Hypertension    Insomnia    Palpitations    Post-nasal drip    with chronic cough    Past Surgical History:  Procedure Laterality Date   ABDOMINAL HYSTERECTOMY     bleeding, ovaries intact   BREAST EXCISIONAL BIOPSY Left    CARPAL TUNNEL RELEASE  11/2004   CESAREAN SECTION     x 3   CHOLECYSTECTOMY  12/2005   COLONOSCOPY     polyps   ESOPHAGOGASTRODUODENOSCOPY     nml   gallstones     Abd [place]    KNEE SURGERY     10/2000   KNEE SURGERY  01/2001   replaced ACL    Family History  Problem Relation Age of Onset   Seizures Father    Hyperlipidemia Mother    Aneurysm Maternal Grandmother    Hyperlipidemia Maternal Grandmother    Hypertension Maternal Grandmother    Colon cancer Neg Hx    Breast cancer Neg Hx     Social History   Socioeconomic History   Marital status: Married    Spouse name: Not on file   Number of children: 3   Years of education: Not on file   Highest education level: Not on file  Occupational History   Occupation: Housewife    Employer: [employer], retail: UNEMPLOYED  Tobacco Use   Smoking status: Former   Smokeless tobacco: Never  Scientific laboratory technician Use: Never used  Substance and Sexual Activity   Alcohol use: Yes    Alcohol/week: 0.0 standard drinks of alcohol    Comment: wine  every once in a while   Drug use: No   Sexual activity: Not on file  Other Topics Concern   Not on file  Social History Narrative   Married, 3 children. Works for [employer], gets regular exercise.    Social Determinants of Health   Financial Resource Strain: Low Risk  (08/02/2021)   Overall Financial Resource Strain (CARDIA)    Difficulty of Paying Living Expenses: Not hard at all  Food Insecurity: No Food Insecurity (08/02/2021)   Hunger Vital Sign    Worried About Running Out of Food in the Last Year: Never true    Ran Out of Food in the Last Year: Never true  Transportation Needs: No Transportation Needs (08/02/2021)   PRAPARE - Hydrologist (Medical): No    Lack of Transportation (Non-Medical): No  Physical Activity: Sufficiently Active (08/02/2021)   Exercise Vital Sign    Days of Exercise per Week: 7 days    Minutes of Exercise per Session: 60 min  Stress: No Stress Concern Present (08/02/2021)   [employer] of  Occupational Health - Occupational Stress Questionnaire    Feeling of Stress : Not at all  Social Connections: Socially Integrated (08/02/2021)   Social Connection and Isolation Panel [NHANES]    Frequency of Communication with Friends and Family: More than three times a week    Frequency of Social Gatherings with Friends and Family: More than three times a week    Attends Religious Services: More than 4 times per year    Active Member of [employer] or Organizations: Yes    Attends Music therapist: More than 4 times per year    Marital Status: Married  Human resources officer Violence: Not At Risk (08/02/2021)   Humiliation, Afraid, Rape, and Kick questionnaire    Fear of Current or Ex-Partner: No    Emotionally Abused: No    Physically Abused: No    Sexually Abused: No    Outpatient Medications Prior to Visit  Medication Sig Dispense Refill   acyclovir (ZOVIRAX) 400 MG tablet Take 1 tablet (400 mg total) by mouth 3 (three)  times daily. For 5 days for cold sore 45 tablet 3   ciclopirox (LOPROX) 0.77 % cream Apply topically.     clindamycin (CLEOCIN T) 1 % lotion Apply topically every morning.     cyclobenzaprine (FLEXERIL) 10 MG tablet Take 1 tablet (10 mg total) by mouth 3 (three) times daily as needed for muscle spasms. Caution of sedation 30 tablet 0   Multiple Vitamins-Minerals (MULTIVITAMIN ADULTS 50+ PO) Take 1 capsule by mouth daily.     RESTASIS 0.05 % ophthalmic emulsion 1 drop 2 (two) times daily.     triamcinolone cream (KENALOG) 0.1 % Apply 1 application topically 2 (two) times daily. To affected areas 30 g 0   Acetaminophen (TYLENOL 8 HOUR ARTHRITIS PAIN PO) Take 1 tablet by mouth 2 (two) times daily at 8 am and 10 pm.     CINNAMON PO Take 1,000 mg by mouth every other day.     No facility-administered medications prior to visit.    Allergies  Allergen Reactions   Penicillins Shortness Of Breath    REACTION: hives   Amoxicillin     REACTION: hives   Atorvastatin     REACTION: joint pain   Bactrim [Sulfamethoxazole-Trimethoprim] Hives   Cephalexin     REACTION: hives   Gabapentin Other (See Comments)    Gave pt thrush    Mometasone Furoate     REACTION: headache   Rosuvastatin     REACTION: headache        Objective:    Physical Exam Constitutional:      General: She is not in acute distress.    Appearance: Normal appearance. She is normal weight. She is not ill-appearing or toxic-appearing.  Pulmonary:     Effort: Pulmonary effort is normal.  Skin:    Findings: Abscess (likely sebaceous cyst, soft and boggy. slightly with fluid filled nontender. no warmth to touch. mild erythema.) present.  Neurological:     General: No focal deficit present.     Mental Status: She is alert and oriented to person, place, and time.  Psychiatric:        Mood and Affect: Mood normal.        Behavior: Behavior normal.        Thought Content: Thought content normal.        Judgment: Judgment  normal.       BP 132/70   Pulse 85   Temp 98.3 F (36.8 C)  Resp 16   Ht '5\' 4"'$  (1.626 m)   Wt 150 lb 2 oz (68.1 kg)   SpO2 99%   BMI 25.77 kg/m  Wt Readings from Last 3 Encounters:  02/22/22 150 lb 2 oz (68.1 kg)  09/13/21 162 lb 6 oz (73.7 kg)  08/07/21 163 lb 6 oz (74.1 kg)     Health Maintenance Due  Topic Date Due   Zoster Vaccines- Shingrix (1 of 2) Never done   COVID-19 Vaccine (4 - Booster for Pfizer series) 07/27/2020   COLONOSCOPY (Pts 45-76yr Insurance coverage will need to be confirmed)  07/31/2021    There are no preventive care reminders to display for this patient.  Lab Results  Component Value Date   TSH 1.76 08/07/2021   Lab Results  Component Value Date   WBC 8.0 08/07/2021   HGB 13.9 08/07/2021   HCT 41.6 08/07/2021   MCV 94.9 08/07/2021   PLT 183.0 08/07/2021   Lab Results  Component Value Date   NA 139 08/07/2021   K 4.0 08/07/2021   CO2 28 08/07/2021   GLUCOSE 94 08/07/2021   BUN 11 08/07/2021   CREATININE 0.68 08/07/2021   BILITOT 0.6 08/07/2021   ALKPHOS 113 08/07/2021   AST 22 08/07/2021   ALT 17 08/07/2021   PROT 6.6 08/07/2021   ALBUMIN 4.3 09/13/2021   CALCIUM 10.7 (H) 08/07/2021   GFR 88.91 08/07/2021   Lab Results  Component Value Date   HGBA1C 5.7 08/07/2021      Assessment & Plan:   Problem List Items Addressed This Visit       Nervous and Auditory   Sebaceous cyst of ear - Primary    Posterior left ear, rx  RX doxycycline 100 mg po bid x 10 days Warm compresses to site Pt wants I&D advised pt that would likely resolve with antbx however recommend general surgeon Pt defers referral at this time, she will call her dermatology office.        Relevant Medications   doxycycline (VIBRA-TABS) 100 MG tablet    Meds ordered this encounter  Medications   doxycycline (VIBRA-TABS) 100 MG tablet    Sig: Take 1 tablet (100 mg total) by mouth 2 (two) times daily for 10 days.    Dispense:  20 tablet    Refill:   0    Order Specific Question:   Supervising Provider    Answer:   BEDSOLE, AMY E [[id number]]    Follow-up: No follow-ups on file.    TEugenia Pancoast FNP

## 2022-04-03 DIAGNOSIS — Z5181 Encounter for therapeutic drug level monitoring: Secondary | ICD-10-CM | POA: Diagnosis not present

## 2022-04-03 DIAGNOSIS — Z79899 Other long term (current) drug therapy: Secondary | ICD-10-CM | POA: Diagnosis not present

## 2022-04-03 DIAGNOSIS — Z0189 Encounter for other specified special examinations: Secondary | ICD-10-CM | POA: Diagnosis not present

## 2022-04-18 DIAGNOSIS — M1712 Unilateral primary osteoarthritis, left knee: Secondary | ICD-10-CM | POA: Diagnosis not present

## 2022-04-18 DIAGNOSIS — G8918 Other acute postprocedural pain: Secondary | ICD-10-CM | POA: Diagnosis not present

## 2022-04-19 DIAGNOSIS — Z5189 Encounter for other specified aftercare: Secondary | ICD-10-CM | POA: Diagnosis not present

## 2022-04-20 DIAGNOSIS — M25662 Stiffness of left knee, not elsewhere classified: Secondary | ICD-10-CM | POA: Diagnosis not present

## 2022-04-23 DIAGNOSIS — M25662 Stiffness of left knee, not elsewhere classified: Secondary | ICD-10-CM | POA: Diagnosis not present

## 2022-04-25 DIAGNOSIS — M25662 Stiffness of left knee, not elsewhere classified: Secondary | ICD-10-CM | POA: Diagnosis not present

## 2022-04-27 DIAGNOSIS — M25662 Stiffness of left knee, not elsewhere classified: Secondary | ICD-10-CM | POA: Diagnosis not present

## 2022-05-02 DIAGNOSIS — M25662 Stiffness of left knee, not elsewhere classified: Secondary | ICD-10-CM | POA: Diagnosis not present

## 2022-05-07 DIAGNOSIS — M25662 Stiffness of left knee, not elsewhere classified: Secondary | ICD-10-CM | POA: Diagnosis not present

## 2022-05-09 DIAGNOSIS — M25662 Stiffness of left knee, not elsewhere classified: Secondary | ICD-10-CM | POA: Diagnosis not present

## 2022-05-11 DIAGNOSIS — M25662 Stiffness of left knee, not elsewhere classified: Secondary | ICD-10-CM | POA: Diagnosis not present

## 2022-05-14 DIAGNOSIS — M25662 Stiffness of left knee, not elsewhere classified: Secondary | ICD-10-CM | POA: Diagnosis not present

## 2022-05-16 DIAGNOSIS — M25662 Stiffness of left knee, not elsewhere classified: Secondary | ICD-10-CM | POA: Diagnosis not present

## 2022-05-18 DIAGNOSIS — M25662 Stiffness of left knee, not elsewhere classified: Secondary | ICD-10-CM | POA: Diagnosis not present

## 2022-05-21 DIAGNOSIS — M25662 Stiffness of left knee, not elsewhere classified: Secondary | ICD-10-CM | POA: Diagnosis not present

## 2022-05-22 DIAGNOSIS — Z5189 Encounter for other specified aftercare: Secondary | ICD-10-CM | POA: Diagnosis not present

## 2022-05-23 DIAGNOSIS — M25662 Stiffness of left knee, not elsewhere classified: Secondary | ICD-10-CM | POA: Diagnosis not present

## 2022-05-25 DIAGNOSIS — M25662 Stiffness of left knee, not elsewhere classified: Secondary | ICD-10-CM | POA: Diagnosis not present

## 2022-07-25 ENCOUNTER — Ambulatory Visit (INDEPENDENT_AMBULATORY_CARE_PROVIDER_SITE_OTHER): Payer: PPO | Admitting: Family Medicine

## 2022-07-25 ENCOUNTER — Encounter: Payer: Self-pay | Admitting: Family Medicine

## 2022-07-25 VITALS — BP 138/84 | HR 70 | Temp 97.2°F | Ht 64.0 in | Wt 148.8 lb

## 2022-07-25 DIAGNOSIS — N3000 Acute cystitis without hematuria: Secondary | ICD-10-CM

## 2022-07-25 DIAGNOSIS — R300 Dysuria: Secondary | ICD-10-CM | POA: Diagnosis not present

## 2022-07-25 LAB — POC URINALSYSI DIPSTICK (AUTOMATED)
Bilirubin, UA: NEGATIVE
Blood, UA: NEGATIVE
Glucose, UA: NEGATIVE
Ketones, UA: NEGATIVE
Nitrite, UA: NEGATIVE
Protein, UA: NEGATIVE
Spec Grav, UA: 1.01 (ref 1.010–1.025)
Urobilinogen, UA: 0.2 E.U./dL
pH, UA: 5.5 (ref 5.0–8.0)

## 2022-07-25 MED ORDER — NITROFURANTOIN MONOHYD MACRO 100 MG PO CAPS: 100.0000 mg | ORAL_CAPSULE | Freq: Two times a day (BID) | ORAL | 0 refills | Status: DC

## 2022-07-25 NOTE — Progress Notes (Signed)
Patient ID: Alisha Long, female    DOB: 1951/07/28, 71 y.o.   MRN: [id number]  This visit was conducted in person.  BP 138/84   Pulse 70   Temp (!) 97.2 F (36.2 C) (Temporal)   Ht '5\' 4"'$  (1.626 m)   Wt 148 lb 12.8 oz (67.5 kg)   SpO2 99%   BMI 25.54 kg/m    CC: dysuria Subjective:   HPI: Alisha Long is a 71 y.o. female presenting on 07/25/2022 for Dysuria (C/o burning when urinating, urinary urgency and frequecy. Sxs started 07/22/22.  Tried AZO and cranberry juice, barely helpful. )   3d h/o dysuria and discomfort with urination, urgency, frequency, some incomplete bladder emptying (some better today).   No fevers/chills, blood in urine, flank pain, abd pain, nausea/vomiting.   She's increased water intake with benefit.  Yesterday started Azo and Cranberry juice - this helped discomfort.   No recent abx use.  Last UTI was a long time ago  Just completed dose of acyclovir.      Relevant past medical, surgical, family and social history reviewed and updated as indicated. Interim medical history since our last visit reviewed. Allergies and medications reviewed and updated. Outpatient Medications Prior to Visit  Medication Sig Dispense Refill   acyclovir (ZOVIRAX) 400 MG tablet Take 1 tablet (400 mg total) by mouth 3 (three) times daily. For 5 days for cold sore 45 tablet 3   ciclopirox (LOPROX) 0.77 % cream Apply topically.     clindamycin (CLEOCIN T) 1 % lotion Apply topically every morning.     cyclobenzaprine (FLEXERIL) 10 MG tablet Take 1 tablet (10 mg total) by mouth 3 (three) times daily as needed for muscle spasms. Caution of sedation 30 tablet 0   Multiple Vitamins-Minerals (MULTIVITAMIN ADULTS 50+ PO) Take 1 capsule by mouth daily.     RESTASIS 0.05 % ophthalmic emulsion 1 drop 2 (two) times daily.     triamcinolone cream (KENALOG) 0.1 % Apply 1 application topically 2 (two) times daily. To affected areas 30 g 0   No facility-administered medications  prior to visit.     Per HPI unless specifically indicated in ROS section below Review of Systems  Objective:  BP 138/84   Pulse 70   Temp (!) 97.2 F (36.2 C) (Temporal)   Ht '5\' 4"'$  (1.626 m)   Wt 148 lb 12.8 oz (67.5 kg)   SpO2 99%   BMI 25.54 kg/m   Wt Readings from Last 3 Encounters:  07/25/22 148 lb 12.8 oz (67.5 kg)  02/22/22 150 lb 2 oz (68.1 kg)  09/13/21 162 lb 6 oz (73.7 kg)      Physical Exam Vitals and nursing note reviewed.  Constitutional:      Appearance: Normal appearance. She is not ill-appearing.  HENT:     Mouth/Throat:     Comments: Wearing mask Eyes:     Extraocular Movements: Extraocular movements intact.     Pupils: Pupils are equal, round, and reactive to light.  Cardiovascular:     Rate and Rhythm: Normal rate and regular rhythm.     Pulses: Normal pulses.     Heart sounds: Normal heart sounds. No murmur heard. Pulmonary:     Effort: Pulmonary effort is normal. No respiratory distress.     Breath sounds: Normal breath sounds. No wheezing, rhonchi or rales.  Abdominal:     General: Bowel sounds are normal. There is no distension.     Palpations: Abdomen is  soft. There is no mass.     Tenderness: There is no abdominal tenderness. There is no right CVA tenderness, left CVA tenderness, guarding or rebound.  Musculoskeletal:     Right lower leg: No edema.     Left lower leg: No edema.  Skin:    General: Skin is warm and dry.     Findings: No rash.  Neurological:     Mental Status: She is alert.  Psychiatric:        Mood and Affect: Mood normal.        Behavior: Behavior normal.       Results for orders placed or performed in visit on 07/25/22  POCT Urinalysis Dipstick (Automated)  Result Value Ref Range   Color, UA yellow    Clarity, UA clear    Glucose, UA Negative Negative   Bilirubin, UA negative    Ketones, UA negative    Spec Grav, UA 1.010 1.010 - 1.025   Blood, UA negative    pH, UA 5.5 5.0 - 8.0   Protein, UA Negative  Negative   Urobilinogen, UA 0.2 0.2 or 1.0 E.U./dL   Nitrite, UA negative    Leukocytes, UA Moderate (2+) (A) Negative    Assessment & Plan:   Problem List Items Addressed This Visit       Unprioritized   UTI (urinary tract infection) - Primary    Story/UA suspicious for UTI - she has significant abx allergies. Rx macrobid 7d course.  UCx sent Further supportive measures reviewed as per instructions. Update if not improving with treatment.  Pt agrees with plan.       Relevant Medications   nitrofurantoin, macrocrystal-monohydrate, (MACROBID) 100 MG capsule   Other Visit Diagnoses     Dysuria       Relevant Orders   POCT Urinalysis Dipstick (Automated) (Completed)   Urine Culture        Meds ordered this encounter  Medications   nitrofurantoin, macrocrystal-monohydrate, (MACROBID) 100 MG capsule    Sig: Take 1 capsule (100 mg total) by mouth 2 (two) times daily.    Dispense:  14 capsule    Refill:  0   Orders Placed This Encounter  Procedures   Urine Culture   POCT Urinalysis Dipstick (Automated)     Patient instructions: Urine and story suspicious for infection Urine culture sent. We will be in touch with results.  Treat with macrobid antibiotic sent to pharmacy.  Push fluids and rest.  Let us know if not improving with treatment.   Follow up plan: Return if symptoms worsen or fail to improve.  Ria Bush, MD

## 2022-07-25 NOTE — Patient Instructions (Addendum)
Urine and story suspicious for infection Urine culture sent. We will be in touch with results.  Treat with macrobid antibiotic sent to pharmacy.  Push fluids and rest.  Let us know if not improving with treatment.   Urinary Tract Infection, Adult  A urinary tract infection (UTI) is an infection of any part of the urinary tract. The urinary tract includes the kidneys, ureters, bladder, and urethra. These organs make, store, and get rid of urine in the body. An upper UTI affects the ureters and kidneys. A lower UTI affects the bladder and urethra. What are the causes? Most urinary tract infections are caused by bacteria in your genital area around your urethra, where urine leaves your body. These bacteria grow and cause inflammation of your urinary tract. What increases the risk? You are more likely to develop this condition if: You have a urinary catheter that stays in place. You are not able to control when you urinate or have a bowel movement (incontinence). You are female and you: Use a spermicide or diaphragm for birth control. Have low estrogen levels. Are pregnant. You have certain genes that increase your risk. You are sexually active. You take antibiotic medicines. You have a condition that causes your flow of urine to slow down, such as: An enlarged prostate, if you are female. Blockage in your urethra. A kidney stone. A nerve condition that affects your bladder control (neurogenic bladder). Not getting enough to drink, or not urinating often. You have certain medical conditions, such as: Diabetes. A weak disease-fighting system (immunesystem). Sickle cell disease. Gout. Spinal cord injury. What are the signs or symptoms? Symptoms of this condition include: Needing to urinate right away (urgency). Frequent urination. This may include small amounts of urine each time you urinate. Pain or burning with urination. Blood in the urine. Urine that smells bad or  unusual. Trouble urinating. Cloudy urine. Vaginal discharge, if you are female. Pain in the abdomen or the lower back. You may also have: Vomiting or a decreased appetite. Confusion. Irritability or tiredness. A fever or chills. Diarrhea. The first symptom in older adults may be confusion. In some cases, they may not have any symptoms until the infection has worsened. How is this diagnosed? This condition is diagnosed based on your medical history and a physical exam. You may also have other tests, including: Urine tests. Blood tests. Tests for STIs (sexually transmitted infections). If you have had more than one UTI, a cystoscopy or imaging studies may be done to determine the cause of the infections. How is this treated? Treatment for this condition includes: Antibiotic medicine. Over-the-counter medicines to treat discomfort. Drinking enough water to stay hydrated. If you have frequent infections or have other conditions such as a kidney stone, you may need to see a health care provider who specializes in the urinary tract (urologist). In rare cases, urinary tract infections can cause sepsis. Sepsis is a life-threatening condition that occurs when the body responds to an infection. Sepsis is treated in the hospital with IV antibiotics, fluids, and other medicines. Follow these instructions at home:  Medicines Take over-the-counter and prescription medicines only as told by your health care provider. If you were prescribed an antibiotic medicine, take it as told by your health care provider. Do not stop using the antibiotic even if you start to feel better. General instructions Make sure you: Empty your bladder often and completely. Do not hold urine for long periods of time. Empty your bladder after sex. Wipe from front to  back after urinating or having a bowel movement if you are female. Use each tissue only one time when you wipe. Drink enough fluid to keep your urine pale  yellow. Keep all follow-up visits. This is important. Contact a health care provider if: Your symptoms do not get better after 1-2 days. Your symptoms go away and then return. Get help right away if: You have severe pain in your back or your lower abdomen. You have a fever or chills. You have nausea or vomiting. Summary A urinary tract infection (UTI) is an infection of any part of the urinary tract, which includes the kidneys, ureters, bladder, and urethra. Most urinary tract infections are caused by bacteria in your genital area. Treatment for this condition often includes antibiotic medicines. If you were prescribed an antibiotic medicine, take it as told by your health care provider. Do not stop using the antibiotic even if you start to feel better. Keep all follow-up visits. This is important. This information is not intended to replace advice given to you by your health care provider. Make sure you discuss any questions you have with your health care provider. Document Revised: 03/25/2020 Document Reviewed: 03/25/2020 Elsevier Patient Education  [place].

## 2022-07-25 NOTE — Assessment & Plan Note (Signed)
Story/UA suspicious for UTI - she has significant abx allergies. Rx macrobid 7d course.  UCx sent Further supportive measures reviewed as per instructions. Update if not improving with treatment.  Pt agrees with plan.

## 2022-07-27 DIAGNOSIS — Z5189 Encounter for other specified aftercare: Secondary | ICD-10-CM | POA: Diagnosis not present

## 2022-07-28 LAB — URINE CULTURE
MICRO NUMBER:: 14246235
SPECIMEN QUALITY:: ADEQUATE

## 2022-07-30 ENCOUNTER — Other Ambulatory Visit: Payer: Self-pay | Admitting: Family Medicine

## 2022-08-01 ENCOUNTER — Telehealth: Payer: Self-pay | Admitting: Family Medicine

## 2022-08-01 DIAGNOSIS — R7309 Other abnormal glucose: Secondary | ICD-10-CM

## 2022-08-01 DIAGNOSIS — E7800 Pure hypercholesterolemia, unspecified: Secondary | ICD-10-CM

## 2022-08-01 DIAGNOSIS — Z862 Personal history of diseases of the blood and blood-forming organs and certain disorders involving the immune mechanism: Secondary | ICD-10-CM

## 2022-08-01 NOTE — Telephone Encounter (Signed)
-----   Message from Velna Hatchet, RT sent at 07/16/2022 12:06 PM EST ----- Regarding: Thu 12/7 lab Patient is scheduled for cpx, please order future labs.  Thanks, Anda Kraft

## 2022-08-02 ENCOUNTER — Other Ambulatory Visit (INDEPENDENT_AMBULATORY_CARE_PROVIDER_SITE_OTHER): Payer: PPO

## 2022-08-02 DIAGNOSIS — R7309 Other abnormal glucose: Secondary | ICD-10-CM | POA: Diagnosis not present

## 2022-08-02 DIAGNOSIS — E7800 Pure hypercholesterolemia, unspecified: Secondary | ICD-10-CM | POA: Diagnosis not present

## 2022-08-02 DIAGNOSIS — Z862 Personal history of diseases of the blood and blood-forming organs and certain disorders involving the immune mechanism: Secondary | ICD-10-CM | POA: Diagnosis not present

## 2022-08-02 LAB — CBC WITH DIFFERENTIAL/PLATELET
Basophils Absolute: 0.1 10*3/uL (ref 0.0–0.1)
Basophils Relative: 0.7 % (ref 0.0–3.0)
Eosinophils Absolute: 0.1 10*3/uL (ref 0.0–0.7)
Eosinophils Relative: 1.6 % (ref 0.0–5.0)
HCT: 42 % (ref 36.0–46.0)
Hemoglobin: 14.3 g/dL (ref 12.0–15.0)
Lymphocytes Relative: 24.5 % (ref 12.0–46.0)
Lymphs Abs: 1.7 10*3/uL (ref 0.7–4.0)
MCHC: 33.9 g/dL (ref 30.0–36.0)
MCV: 95.3 fl (ref 78.0–100.0)
Monocytes Absolute: 0.5 10*3/uL (ref 0.1–1.0)
Monocytes Relative: 6.6 % (ref 3.0–12.0)
Neutro Abs: 4.6 10*3/uL (ref 1.4–7.7)
Neutrophils Relative %: 66.6 % (ref 43.0–77.0)
Platelets: 192 10*3/uL (ref 150.0–400.0)
RBC: 4.41 Mil/uL (ref 3.87–5.11)
RDW: 13 % (ref 11.5–15.5)
WBC: 6.9 10*3/uL (ref 4.0–10.5)

## 2022-08-02 LAB — COMPREHENSIVE METABOLIC PANEL
ALT: 14 U/L (ref 0–35)
AST: 16 U/L (ref 0–37)
Albumin: 4.2 g/dL (ref 3.5–5.2)
Alkaline Phosphatase: 114 U/L (ref 39–117)
BUN: 13 mg/dL (ref 6–23)
CO2: 32 mEq/L (ref 19–32)
Calcium: 10.2 mg/dL (ref 8.4–10.5)
Chloride: 105 mEq/L (ref 96–112)
Creatinine, Ser: 0.73 mg/dL (ref 0.40–1.20)
GFR: 83.38 mL/min (ref >60.00)
Glucose, Bld: 94 mg/dL (ref 70–99)
Potassium: 4.1 mEq/L (ref 3.5–5.1)
Sodium: 142 mEq/L (ref 135–145)
Total Bilirubin: 0.5 mg/dL (ref 0.2–1.2)
Total Protein: 6.3 g/dL (ref 6.0–8.3)

## 2022-08-02 LAB — LIPID PANEL
Cholesterol: 205 mg/dL — ABNORMAL HIGH (ref 0–200)
HDL: 57.2 mg/dL (ref >39.00)
LDL Cholesterol: 123 mg/dL — ABNORMAL HIGH (ref 0–99)
NonHDL: 148.29
Total CHOL/HDL Ratio: 4
Triglycerides: 127 mg/dL (ref 0.0–149.0)
VLDL: 25.4 mg/dL (ref 0.0–40.0)

## 2022-08-02 LAB — HEMOGLOBIN A1C: Hgb A1c MFr Bld: 5.9 % (ref 4.6–6.5)

## 2022-08-02 LAB — TSH: TSH: 2.61 u[IU]/mL (ref 0.35–5.50)

## 2022-08-09 ENCOUNTER — Encounter: Payer: Self-pay | Admitting: Family Medicine

## 2022-08-09 ENCOUNTER — Ambulatory Visit (INDEPENDENT_AMBULATORY_CARE_PROVIDER_SITE_OTHER): Payer: PPO | Admitting: Family Medicine

## 2022-08-09 VITALS — BP 131/70 | HR 77 | Temp 97.3°F | Ht 64.25 in | Wt 148.2 lb

## 2022-08-09 DIAGNOSIS — R7309 Other abnormal glucose: Secondary | ICD-10-CM

## 2022-08-09 DIAGNOSIS — Z0000 Encounter for general adult medical examination without abnormal findings: Secondary | ICD-10-CM | POA: Diagnosis not present

## 2022-08-09 DIAGNOSIS — E7800 Pure hypercholesterolemia, unspecified: Secondary | ICD-10-CM | POA: Diagnosis not present

## 2022-08-09 NOTE — Assessment & Plan Note (Signed)
Reviewed health habits including diet and exercise and skin cancer prevention Reviewed appropriate screening tests for age  Also reviewed health mt list, fam hx and immunization status , as well as social and family history   See HPI Labs reviewed  Declines shingrix but may consider later Colonoscopy 2017 with 7 y recall  Mammogram 01/2022 Dexa was 2022 but we need to get report from mobile unit  No falls/fx and takes vit D

## 2022-08-09 NOTE — Progress Notes (Signed)
Subjective:    Patient ID: Domingo Dimes, female    DOB: Oct 22, 1950, 71 y.o.   MRN: [id number]  HPI Here for health maintenance exam and to review chronic medical problems    Wt Readings from Last 3 Encounters:  08/09/22 148 lb 4 oz (67.2 kg)  07/25/22 148 lb 12.8 oz (67.5 kg)  02/22/22 150 lb 2 oz (68.1 kg)   25.25 kg/m  Feeling fine  Got rid of her uti  Has some baseline urgency  Is drinking water   Had both knees replaced this year Doing well  Back to normal and back to the gym   Immunization History  Administered Date(s) Administered   Fluad Quad(high Dose 65+) 07/06/2019, 07/11/2020   Influenza, High Dose Seasonal PF 08/01/2021   Influenza,inj,Quad PF,6+ Mos 06/30/2014, 05/13/2015, 05/14/2016, 05/21/2017, 05/23/2018   Influenza-Unspecified 08/01/2021, 07/21/2022   PFIZER(Purple Top)SARS-COV-2 Vaccination 10/01/2019, 10/22/2019, 06/01/2020   Pneumococcal Conjugate-13 05/21/2017   Pneumococcal Polysaccharide-23 05/23/2018   Td 04/27/2003, 09/09/2013   Tdap 05/14/2016   Zoster, Live 05/06/2012   Health Maintenance Due  Topic Date Due   Zoster Vaccines- Shingrix (1 of 2) Never done   COLONOSCOPY (Pts 45-35yr Insurance coverage will need to be confirmed)  07/31/2021   COVID-19 Vaccine (4 - 2023-24 season) 04/27/2022   Medicare Annual Wellness (AWV)  08/02/2022    Shingrix: declines but may get later   Colonoscopy 07/2016 with 5 y recall  She called for an appt - and it was moved to 7 years so due in a year   Mammogram 01/2022 Self breast exam  Dexa 06/2015 Had one in 2022 with mobile unit Falls: none Fractures: none  Supplements : vitamin D  Exercise : gym   BP Readings from Last 3 Encounters:  08/09/22 131/70  07/25/22 138/84  02/22/22 132/70    Pulse Readings from Last 3 Encounters:  08/09/22 77  07/25/22 70  02/22/22 85     Elevated glucose Lab Results  Component Value Date   HGBA1C 5.9 08/02/2022  5.7 last time Is mindful of sugar  in diet  Husband is diabetic  Is drinking some sweet tea      Hyperlipidemia  Lab Results  Component Value Date   CHOL 205 (H) 08/02/2022   CHOL 227 (H) 08/07/2021   CHOL 201 (H) 07/07/2020   Lab Results  Component Value Date   HDL 57.20 08/02/2022   HDL 53.60 08/07/2021   HDL 56.70 07/07/2020   Lab Results  Component Value Date   LDLCALC 123 (H) 08/02/2022   LDLCALC 139 (H) 08/07/2021   LDLCALC 113 (H) 07/07/2020   Lab Results  Component Value Date   TRIG 127.0 08/02/2022   TRIG 171.0 (H) 08/07/2021   TRIG 156.0 (H) 07/07/2020   Lab Results  Component Value Date   CHOLHDL 4 08/02/2022   CHOLHDL 4 08/07/2021   CHOLHDL 4 07/07/2020   Lab Results  Component Value Date   LDLDIRECT 154.0 05/25/2019   LDLDIRECT 144.0 05/13/2017   LDLDIRECT 135.0 05/14/2016   Intolerant of several statins  Eating well    Other labs Lab Results  Component Value Date   CREATININE 0.73 08/02/2022   BUN 13 08/02/2022   NA 142 08/02/2022   K 4.1 08/02/2022   CL 105 08/02/2022   CO2 32 08/02/2022   Lab Results  Component Value Date   ALT 14 08/02/2022   AST 16 08/02/2022   ALKPHOS 114 08/02/2022   BILITOT 0.5 08/02/2022  Lab Results  Component Value Date   WBC 6.9 08/02/2022   HGB 14.3 08/02/2022   HCT 42.0 08/02/2022   MCV 95.3 08/02/2022   PLT 192.0 08/02/2022   Lab Results  Component Value Date   TSH 2.61 08/02/2022   Patient Active Problem List   Diagnosis Date Noted   Sebaceous cyst of ear 02/22/2022   Chronic cough 08/07/2021   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   Bilateral primary osteoarthritis of knee 07/26/2020   Routine general medical examination at a health care facility 07/07/2020   Medicare annual wellness visit, initial 07/06/2019   Elevated glucose 58/85/0277   Lichen planus 41/28/7867   Oral lichen planus 67/20/9470   Seborrheic keratosis 12/14/2016   Estrogen deficiency 05/24/2015   Encounter for routine gynecological examination  04/15/2012   HEADACHE 11/01/2010   COLONIC POLYPS 01/06/2010   POSTNASAL DRIP 01/06/2010   IRON DEFICIENCY ANEMIA, HX OF 11/07/2009   OTHER SEBORRHEIC KERATOSIS 07/19/2009   SYMPTOM, INSOMNIA NOS 06/18/2007   Hyperlipidemia 06/11/2007   Past Medical History:  Diagnosis Date   Colon polyps    History of shingles    Hyperlipidemia    Hypertension    Insomnia    Palpitations    Post-nasal drip    with chronic cough   Past Surgical History:  Procedure Laterality Date   ABDOMINAL HYSTERECTOMY     bleeding, ovaries intact   BREAST EXCISIONAL BIOPSY Left    CARPAL TUNNEL RELEASE  11/2004   CESAREAN SECTION     x 3   CHOLECYSTECTOMY  12/2005   COLONOSCOPY     polyps   ESOPHAGOGASTRODUODENOSCOPY     nml   gallstones     Abd [place]    KNEE SURGERY     10/2000   KNEE SURGERY  01/2001   replaced ACL   Social History   Tobacco Use   Smoking status: Former   Smokeless tobacco: Never  Vaping Use   Vaping Use: Never used  Substance Use Topics   Alcohol use: Yes    Alcohol/week: 0.0 standard drinks of alcohol    Comment: wine every once in a while   Drug use: No   Family History  Problem Relation Age of Onset   Seizures Father    Hyperlipidemia Mother    Aneurysm Maternal Grandmother    Hyperlipidemia Maternal Grandmother    Hypertension Maternal Grandmother    Colon cancer Neg Hx    Breast cancer Neg Hx    Allergies  Allergen Reactions   Penicillins Shortness Of Breath    REACTION: hives   Amoxicillin     REACTION: hives   Atorvastatin     REACTION: joint pain   Bactrim [Sulfamethoxazole-Trimethoprim] Hives   Cephalexin     REACTION: hives   Gabapentin Other (See Comments)    Gave pt thrush    Mometasone Furoate     REACTION: headache   Rosuvastatin     REACTION: headache   Current Outpatient Medications on File Prior to Visit  Medication Sig Dispense Refill   acyclovir (ZOVIRAX) 400 MG tablet Take 1 tablet (400 mg total) by mouth 3 (three) times daily.  For 5 days for cold sore 45 tablet 3   ciclopirox (LOPROX) 0.77 % cream Apply topically.     clindamycin (CLEOCIN T) 1 % lotion Apply topically every morning.     cyclobenzaprine (FLEXERIL) 10 MG tablet Take 1 tablet (10 mg total) by mouth 3 (three) times daily as needed for muscle spasms.  Caution of sedation 30 tablet 0   Multiple Vitamins-Minerals (MULTIVITAMIN ADULTS 50+ PO) Take 1 capsule by mouth daily.     RESTASIS 0.05 % ophthalmic emulsion 1 drop 2 (two) times daily.     triamcinolone cream (KENALOG) 0.1 % Apply 1 application topically 2 (two) times daily. To affected areas 30 g 0   No current facility-administered medications on file prior to visit.     Review of Systems  Constitutional:  Negative for activity change, appetite change, fatigue, fever and unexpected weight change.  HENT:  Negative for congestion, ear pain, rhinorrhea, sinus pressure and sore throat.   Eyes:  Negative for pain, redness and visual disturbance.  Respiratory:  Negative for cough, shortness of breath and wheezing.   Cardiovascular:  Negative for chest pain and palpitations.  Gastrointestinal:  Negative for abdominal pain, blood in stool, constipation and diarrhea.  Endocrine: Negative for polydipsia and polyuria.  Genitourinary:  Negative for dysuria, frequency and urgency.  Musculoskeletal:  Negative for arthralgias, back pain and myalgias.  Skin:  Negative for pallor and rash.  Allergic/Immunologic: Negative for environmental allergies.  Neurological:  Negative for dizziness, syncope and headaches.  Hematological:  Negative for adenopathy. Does not bruise/bleed easily.  Psychiatric/Behavioral:  Negative for decreased concentration and dysphoric mood. The patient is not nervous/anxious.        Objective:   Physical Exam Constitutional:      General: She is not in acute distress.    Appearance: Normal appearance. She is well-developed and normal weight. She is not ill-appearing or diaphoretic.   HENT:     Head: Normocephalic and atraumatic.     Right Ear: Tympanic membrane, ear canal and external ear normal.     Left Ear: Tympanic membrane, ear canal and external ear normal.     Nose: Nose normal. No congestion.     Mouth/Throat:     Mouth: Mucous membranes are moist.     Pharynx: Oropharynx is clear. No posterior oropharyngeal erythema.  Eyes:     General: No scleral icterus.    Extraocular Movements: Extraocular movements intact.     Conjunctiva/sclera: Conjunctivae normal.     Pupils: Pupils are equal, round, and reactive to light.  Neck:     Thyroid: No thyromegaly.     Vascular: No carotid bruit or JVD.  Cardiovascular:     Rate and Rhythm: Normal rate and regular rhythm.     Pulses: Normal pulses.     Heart sounds: Normal heart sounds.     No gallop.  Pulmonary:     Effort: Pulmonary effort is normal. No respiratory distress.     Breath sounds: Normal breath sounds. No wheezing.     Comments: Good air exch Chest:     Chest wall: No tenderness.  Abdominal:     General: Bowel sounds are normal. There is no distension or abdominal bruit.     Palpations: Abdomen is soft. There is no mass.     Tenderness: There is no abdominal tenderness.     Hernia: No hernia is present.  Genitourinary:    Comments: Breast exam: No mass, nodules, thickening, tenderness, bulging, retraction, inflamation, nipple discharge or skin changes noted.  No axillary or clavicular LA.     Musculoskeletal:        General: No tenderness. Normal range of motion.     Cervical back: Normal range of motion and neck supple. No rigidity. No muscular tenderness.     Right lower leg: No edema.  Left lower leg: No edema.     Comments: No kyphosis   Lymphadenopathy:     Cervical: No cervical adenopathy.  Skin:    General: Skin is warm and dry.     Coloration: Skin is not pale.     Findings: No erythema or rash.     Comments: Solar lentigines diffusely  Few sks  Neurological:     Mental  Status: She is alert. Mental status is at baseline.     Cranial Nerves: No cranial nerve deficit.     Motor: No abnormal muscle tone.     Coordination: Coordination normal.     Gait: Gait normal.     Deep Tendon Reflexes: Reflexes are normal and symmetric.  Psychiatric:        Mood and Affect: Mood normal.        Cognition and Memory: Cognition and memory normal.           Assessment & Plan:   Problem List Items Addressed This Visit       Other   Elevated glucose    Lab Results  Component Value Date   HGBA1C 5.9 08/02/2022  disc imp of low glycemic diet and wt loss to prevent DM2       Hyperlipidemia    Disc goals for lipids and reasons to control them Rev last labs with pt Rev low sat fat diet in detail  LDL down to 123 Will continue to monitor       Routine general medical examination at a health care facility - Primary    Reviewed health habits including diet and exercise and skin cancer prevention Reviewed appropriate screening tests for age  Also reviewed health mt list, fam hx and immunization status , as well as social and family history   See HPI Labs reviewed  Declines shingrix but may consider later Colonoscopy 2017 with 7 y recall  Mammogram 01/2022 Dexa was 2022 but we need to get report from mobile unit  No falls/fx and takes vit D

## 2022-08-09 NOTE — Patient Instructions (Addendum)
I need a copy of your dexa from the mobile unit  Please get it to me or tell me where to send for it   Avoid added sugar  Try to get most of your carbohydrates from produce (with the exception of white potatoes)  Eat less bread/pasta/rice/snack foods/cereals/sweets and other items from the middle of the grocery store (processed carbs)  Keep up the good exercise

## 2022-08-09 NOTE — Assessment & Plan Note (Signed)
Lab Results  Component Value Date   HGBA1C 5.9 08/02/2022   disc imp of low glycemic diet and wt loss to prevent DM2

## 2022-08-09 NOTE — Assessment & Plan Note (Signed)
Disc goals for lipids and reasons to control them Rev last labs with pt Rev low sat fat diet in detail  LDL down to 123 Will continue to monitor

## 2022-08-13 ENCOUNTER — Ambulatory Visit (INDEPENDENT_AMBULATORY_CARE_PROVIDER_SITE_OTHER): Payer: PPO

## 2022-08-13 VITALS — Ht 64.25 in | Wt 148.0 lb

## 2022-08-13 DIAGNOSIS — Z0000 Encounter for general adult medical examination without abnormal findings: Secondary | ICD-10-CM | POA: Diagnosis not present

## 2022-08-13 NOTE — Progress Notes (Signed)
Subjective:   Alisha Long is a 71 y.o. female who presents for Medicare Annual (Subsequent) preventive examination.  Review of Systems    No ROS.  Medicare Wellness Virtual Visit.  Visual/audio telehealth visit, UTA vital signs.   See social history for additional risk factors.   Cardiac Risk Factors include: advanced age (>5mn, >>17women)     Objective:    Today's Vitals   08/13/22 1406  Weight: 148 lb (67.1 kg)  Height: 5' 4.25" (1.632 m)   Body mass index is 25.21 kg/m.     08/13/2022    2:10 PM 08/02/2021    8:16 AM 07/29/2020    8:15 AM 05/23/2018   11:34 AM 07/31/2016    7:30 AM 07/17/2016    9:53 AM  Advanced Directives  Does Patient Have a Medical Advance Directive? Yes Yes Yes Yes Yes Yes  Type of AParamedicof [place]Living will [place]Living will [place]Living will Living will    Does patient want to make changes to medical advance directive? No - Patient declined Yes (ED - Information included in AVS)      Copy of HCircle [place]ates[place] Chart? No - copy requested  No - copy requested     Would patient like information on creating a medical advance directive?    No - Patient declined      Current Medications (verified) Outpatient Encounter Medications as of 08/13/2022  Medication Sig   acyclovir (ZOVIRAX) 400 MG tablet Take 1 tablet (400 mg total) by mouth 3 (three) times daily. For 5 days for cold sore   ciclopirox (LOPROX) 0.77 % cream Apply topically.   clindamycin (CLEOCIN T) 1 % lotion Apply topically every morning.   cyclobenzaprine (FLEXERIL) 10 MG tablet Take 1 tablet (10 mg total) by mouth 3 (three) times daily as needed for muscle spasms. Caution of sedation   Multiple Vitamins-Minerals (MULTIVITAMIN ADULTS 50+ PO) Take 1 capsule by mouth daily.   RESTASIS 0.05 % ophthalmic emulsion 1 drop 2 (two) times daily.   triamcinolone cream (KENALOG) 0.1 % Apply 1  application topically 2 (two) times daily. To affected areas   No facility-administered encounter medications on file as of 08/13/2022.    Allergies (verified) Penicillins, Amoxicillin, Atorvastatin, Bactrim [sulfamethoxazole-trimethoprim], Cephalexin, Gabapentin, Mometasone furoate, and Rosuvastatin   History: Past Medical History:  Diagnosis Date   Colon polyps    History of shingles    Hyperlipidemia    Hypertension    Insomnia    Palpitations    Post-nasal drip    with chronic cough   Past Surgical History:  Procedure Laterality Date   ABDOMINAL HYSTERECTOMY     bleeding, ovaries intact   BREAST EXCISIONAL BIOPSY Left    CARPAL TUNNEL RELEASE  11/2004   CESAREAN SECTION     x 3   CHOLECYSTECTOMY  12/2005   COLONOSCOPY     polyps   ESOPHAGOGASTRODUODENOSCOPY     nml   gallstones     Abd [place]   KNEE SURGERY     10/2000   KNEE SURGERY  01/2001   replaced ACL   Family History  Problem Relation Age of Onset   Seizures Father    Hyperlipidemia Mother    Aneurysm Maternal Grandmother    Hyperlipidemia Maternal Grandmother    Hypertension Maternal Grandmother    Colon cancer Neg Hx    Breast cancer Neg Hx    Social History  Socioeconomic History   Marital status: Married    Spouse name: Not on file   Number of children: 3   Years of education: Not on file   Highest education level: Not on file  Occupational History   Occupation: Housewife    Employer: [employer], retail: UNEMPLOYED  Tobacco Use   Smoking status: Former   Smokeless tobacco: Never  Scientific laboratory technician Use: Never used  Substance and Sexual Activity   Alcohol use: Yes    Alcohol/week: 0.0 standard drinks of alcohol    Comment: wine every once in a while   Drug use: No   Sexual activity: Not on file  Other Topics Concern   Not on file  Social History Narrative   Married, 3 children. Works for [employer], gets regular exercise.    Social Determinants of Health    Financial Resource Strain: Low Risk  (08/13/2022)   Overall Financial Resource Strain (CARDIA)    Difficulty of Paying Living Expenses: Not hard at all  Food Insecurity: No Food Insecurity (08/13/2022)   Hunger Vital Sign    Worried About Running Out of Food in the Last Year: Never true    Ran Out of Food in the Last Year: Never true  Transportation Needs: No Transportation Needs (08/13/2022)   PRAPARE - Hydrologist (Medical): No    Lack of Transportation (Non-Medical): No  Physical Activity: Sufficiently Active (08/13/2022)   Exercise Vital Sign    Days of Exercise per Week: 3 days    Minutes of Exercise per Session: 60 min  Stress: No Stress Concern Present (08/13/2022)   [place]    Feeling of Stress : Not at all  Social Connections: Unknown (08/13/2022)   Social Connection and Isolation Panel [NHANES]    Frequency of Communication with Friends and Family: More than three times a week    Frequency of Social Gatherings with Friends and Family: More than three times a week    Attends Religious Services: Not on Advertising copywriter or Organizations: Yes    Attends Archivist Meetings: 1 to 4 times per year    Marital Status: Married    Tobacco Counseling Counseling given: Not Answered   Clinical Intake:  Pre-visit preparation completed: Yes           How often do you need to have someone help you when you read instructions, pamphlets, or other written materials from your doctor or pharmacy?: 1 - Never    Interpreter Needed?: No      Activities of Daily Living    08/13/2022    2:11 PM 08/12/2022    5:17 PM  In your present state of health, do you have any difficulty performing the following activities:  Hearing? 0 0  Vision? 0 0  Difficulty concentrating or making decisions? 0 0  Walking or climbing stairs? 0 0  Dressing or bathing? 0 0   Doing errands, shopping? 0 0  Preparing Food and eating ? N N  Using the Toilet? N N  In the past six months, have you accidently leaked urine? N N  Do you have problems with loss of bowel control? N N  Managing your Medications? N N  Managing your Finances? N N  Housekeeping or managing your Housekeeping? N N    Patient Care Team: Tower, Wynelle Fanny, MD as PCP -  General  Indicate any recent Medical Services you may have received from other than [facility] providers in the past year (date may be approximate).     Assessment:   This is a routine wellness examination for [place].  I connected with  Domingo Dimes on 08/13/22 by a audio enabled telemedicine application and verified that I am speaking with the correct person using two identifiers.  Patient Location: Home  Provider Location: Office/Clinic  I discussed the limitations of evaluation and management by telemedicine. The patient expressed understanding and agreed to proceed.   Hearing/Vision screen Hearing Screening - Comments:: Patient is able to hear conversational tones without difficulty.  No issues reported.   Vision Screening - Comments:: Last exam 01/2021, Dr Satira Sark, wears readers  Dietary issues and exercise activities discussed: Current Exercise Habits: Home exercise routine, Type of exercise: stretching, Time (Minutes): 60, Frequency (Times/Week): 5, Weekly Exercise (Minutes/Week): 300, Intensity: Mild She tries to have a healthy diet. Monitoring cholesterol.    Goals Addressed             This Visit's Progress    Low cholesterol diet         Depression Screen    08/13/2022    2:09 PM 08/09/2022    9:33 AM 08/02/2021    8:19 AM 07/29/2020    8:17 AM 07/11/2020    3:32 PM 07/06/2019    2:34 PM 05/23/2018   11:34 AM  PHQ 2/9 Scores  PHQ - 2 Score 0 0 0 0 0 0 0  PHQ- 9 Score 0 0  0   0    Fall Risk    08/13/2022    2:11 PM 08/12/2022    5:17 PM 08/02/2021    8:17 AM 07/29/2020    8:16 AM  07/11/2020    3:31 PM  Fall Risk   Falls in the past year? 0 0 0 0 0  Number falls in past yr: 0 0 0 0   Injury with Fall? 0 0 0 0   Risk for fall due to :   No Fall Risks No Fall Risks History of fall(s)  Follow up Falls evaluation completed;Falls prevention discussed  Falls prevention discussed Falls evaluation completed;Falls prevention discussed     FALL RISK PREVENTION PERTAINING TO THE HOME: Home free of loose throw rugs in walkways, pet beds, electrical cords, etc? Yes  Adequate lighting in your home to reduce risk of falls? Yes   ASSISTIVE DEVICES UTILIZED TO PREVENT FALLS: Life alert? No  Use of a cane, walker or w/c? No   TIMED UP AND GO: Was the test performed? No .   Cognitive Function:    07/29/2020    8:21 AM 05/23/2018   11:34 AM  MMSE - Mini Mental State Exam  Orientation to time 5 5  Orientation to Place 5 5  Registration 3 3  Attention/ Calculation 5 0  Recall 3 3  Language- name 2 objects  0  Language- repeat 1 1  Language- follow 3 step command  3  Language- read & follow direction  0  Write a sentence  0  Copy design  0  Total score  20        08/13/2022    2:11 PM  6CIT Screen  What Year? 0 points  What month? 0 points  What time? 0 points  Count back from 20 0 points  Months in reverse 0 points  Repeat phrase 0 points  Total Score 0 points  Immunizations Immunization History  Administered Date(s) Administered   Fluad Quad(high Dose 65+) 07/06/2019, 07/11/2020   Influenza, High Dose Seasonal PF 08/01/2021   Influenza,inj,Quad PF,6+ Mos 06/30/2014, 05/13/2015, 05/14/2016, 05/21/2017, 05/23/2018   Influenza-Unspecified 08/01/2021, 07/21/2022   PFIZER(Purple Top)SARS-COV-2 Vaccination 10/01/2019, 10/22/2019, 06/01/2020   Pneumococcal Conjugate-13 05/21/2017   Pneumococcal Polysaccharide-23 05/23/2018   Td 04/27/2003, 09/09/2013   Tdap 05/14/2016   Zoster, Live 05/06/2012   Screening Tests Health Maintenance  Topic Date Due    COVID-19 Vaccine (4 - 2023-24 season) 08/29/2022 (Originally 04/27/2022)   Zoster Vaccines- Shingrix (1 of 2) 11/08/2022 (Originally 04/04/1970)   COLONOSCOPY (Pts 45-9yr Insurance coverage will need to be confirmed)  01/26/2023 (Originally 07/31/2021)   MAMMOGRAM  01/31/2023   Medicare Annual Wellness (AWV)  08/14/2023   DTaP/Tdap/Td (4 - Td or Tdap) 05/14/2026   Pneumonia Vaccine 70 Years old  Completed   INFLUENZA VACCINE  Completed   DEXA SCAN  Completed   Hepatitis C Screening  Completed   HPV VACCINES  Aged Out   Health Maintenance There are no preventive care reminders to display for this patient.  Colonoscopy- patient reports colonoscopy due 2025. Deferred.  Lung Cancer Screening: (Low Dose CT Chest recommended if Age 70-80years, 30 pack-year currently smoking OR have quit w/in 15years.) does not qualify.   Hepatitis C Screening: Completed 2017.  Vision Screening: Recommended annual ophthalmology exams for early detection of glaucoma and other disorders of the eye.  Dental Screening: Recommended annual dental exams for proper oral hygiene.  Community Resource Referral / Chronic Care Management: CRR required this visit?  No   CCM required this visit?  No      Plan:     I have personally reviewed and noted the following in the patient's chart:   Medical and social history Use of alcohol, tobacco or illicit drugs  Current medications and supplements including opioid prescriptions. Patient is not currently taking opioid prescriptions. Functional ability and status Nutritional status Physical activity Advanced directives List of other physicians Hospitalizations, surgeries, and ER visits in previous 12 months Vitals Screenings to include cognitive, depression, and falls Referrals and appointments  In addition, I have reviewed and discussed with patient certain preventive protocols, quality metrics, and best practice recommendations. A written personalized care plan  for preventive services as well as general preventive health recommendations were provided to patient.     DLeta Jungling LPN   [id number]/08/7492

## 2022-08-13 NOTE — Patient Instructions (Addendum)
[name] , Thank you for taking time to come for your Medicare Wellness Visit. I appreciate your ongoing commitment to your health goals. Please review the following plan we discussed and let me know if I can assist you in the future.   These are the goals we discussed:     Goals Addressed             This Visit's Progress    Low cholesterol diet         This is a list of the screening recommended for you and due dates:  Health Maintenance  Topic Date Due   COVID-19 Vaccine (4 - 2023-24 season) 08/29/2022*   Zoster (Shingles) Vaccine (1 of 2) 11/08/2022*   Colon Cancer Screening  01/26/2023*   Mammogram  01/31/2023   Medicare Annual Wellness Visit  08/14/2023   DTaP/Tdap/Td vaccine (4 - Td or Tdap) 05/14/2026   Pneumonia Vaccine  Completed   Flu Shot  Completed   DEXA scan (bone density measurement)  Completed   Hepatitis C Screening: USPSTF Recommendation to screen - Ages 82-79 yo.  Completed   HPV Vaccine  Aged Out  *Topic was postponed. The date shown is not the original due date.    Advanced directives: End of life planning; Advance aging; Advanced directives discussed.  Copy of current HCPOA/Living Will requested.    Conditions/risks identified: none new.  Next appointment: Follow up in one year for your annual wellness visit.   Preventive Care 2 Years and Older, Female Preventive care refers to lifestyle choices and visits with your health care provider that can promote health and wellness. What does preventive care include? A yearly physical exam. This is also called an annual well check. Dental exams once or twice a year. Routine eye exams. Ask your health care provider how often you should have your eyes checked. Personal lifestyle choices, including: Daily care of your teeth and gums. Regular physical activity. Eating a healthy diet. Avoiding tobacco and drug use. Limiting alcohol use. Practicing safe sex. Taking low-dose aspirin every day. Taking  vitamin and mineral supplements as recommended by your health care provider. What happens during an annual well check? The services and screenings done by your health care provider during your annual well check will depend on your age, overall health, lifestyle risk factors, and family history of disease. Counseling  Your health care provider may ask you questions about your: Alcohol use. Tobacco use. Drug use. Emotional well-being. Home and relationship well-being. Sexual activity. Eating habits. History of falls. Memory and ability to understand (cognition). Work and work Statistician. Reproductive health. Screening  You may have the following tests or measurements: Height, weight, and BMI. Blood pressure. Lipid and cholesterol levels. These may be checked every 5 years, or more frequently if you are over 7 years old. Skin check. Lung cancer screening. You may have this screening every year starting at age 24 if you have a 30-pack-year history of smoking and currently smoke or have quit within the past 15 years. Fecal occult blood test (FOBT) of the stool. You may have this test every year starting at age 20. Flexible sigmoidoscopy or colonoscopy. You may have a sigmoidoscopy every 5 years or a colonoscopy every 10 years starting at age 59. Hepatitis C blood test. Hepatitis B blood test. Sexually transmitted disease (STD) testing. Diabetes screening. This is done by checking your blood sugar (glucose) after you have not eaten for a while (fasting). You may have this done every 1-3 years.  Bone density scan. This is done to screen for osteoporosis. You may have this done starting at age 37. Mammogram. This may be done every 1-2 years. Talk to your health care provider about how often you should have regular mammograms. Talk with your health care provider about your test results, treatment options, and if necessary, the need for more tests. Vaccines  Your health care provider may  recommend certain vaccines, such as: Influenza vaccine. This is recommended every year. Tetanus, diphtheria, and acellular pertussis (Tdap, Td) vaccine. You may need a Td booster every 10 years. Zoster vaccine. You may need this after age 25. Pneumococcal 13-valent conjugate (PCV13) vaccine. One dose is recommended after age 59. Pneumococcal polysaccharide (PPSV23) vaccine. One dose is recommended after age 62. Talk to your health care provider about which screenings and vaccines you need and how often you need them. This information is not intended to replace advice given to you by your health care provider. Make sure you discuss any questions you have with your health care provider. Document Released: 09/09/2015 Document Revised: 05/02/2016 Document Reviewed: 06/14/2015 Elsevier Interactive Patient Education  2017 [place] Prevention in the Home Falls can cause injuries. They can happen to people of all ages. There are many things you can do to make your home safe and to help prevent falls. What can I do on the outside of my home? Regularly fix the edges of walkways and driveways and fix any cracks. Remove anything that might make you trip as you walk through a door, such as a raised step or threshold. Trim any bushes or trees on the path to your home. Use bright outdoor lighting. Clear any walking paths of anything that might make someone trip, such as rocks or tools. Regularly check to see if handrails are loose or broken. Make sure that both sides of any steps have handrails. Any raised decks and porches should have guardrails on the edges. Have any leaves, snow, or ice cleared regularly. Use sand or salt on walking paths during winter. Clean up any spills in your garage right away. This includes oil or grease spills. What can I do in the bathroom? Use night lights. Install grab bars by the toilet and in the tub and shower. Do not use towel bars as grab bars. Use non-skid  mats or decals in the tub or shower. If you need to sit down in the shower, use a plastic, non-slip stool. Keep the floor dry. Clean up any water that spills on the floor as soon as it happens. Remove soap buildup in the tub or shower regularly. Attach bath mats securely with double-sided non-slip rug tape. Do not have throw rugs and other things on the floor that can make you trip. What can I do in the bedroom? Use night lights. Make sure that you have a light by your bed that is easy to reach. Do not use any sheets or blankets that are too big for your bed. They should not hang down onto the floor. Have a firm chair that has side arms. You can use this for support while you get dressed. Do not have throw rugs and other things on the floor that can make you trip. What can I do in the kitchen? Clean up any spills right away. Avoid walking on wet floors. Keep items that you use a lot in easy-to-reach places. If you need to reach something above you, use a strong step stool that has a grab bar.  Keep electrical cords out of the way. Do not use floor polish or wax that makes floors slippery. If you must use wax, use non-skid floor wax. Do not have throw rugs and other things on the floor that can make you trip. What can I do with my stairs? Do not leave any items on the stairs. Make sure that there are handrails on both sides of the stairs and use them. Fix handrails that are broken or loose. Make sure that handrails are as long as the stairways. Check any carpeting to make sure that it is firmly attached to the stairs. Fix any carpet that is loose or worn. Avoid having throw rugs at the top or bottom of the stairs. If you do have throw rugs, attach them to the floor with carpet tape. Make sure that you have a light switch at the top of the stairs and the bottom of the stairs. If you do not have them, ask someone to add them for you. What else can I do to help prevent falls? Wear shoes  that: Do not have high heels. Have rubber bottoms. Are comfortable and fit you well. Are closed at the toe. Do not wear sandals. If you use a stepladder: Make sure that it is fully opened. Do not climb a closed stepladder. Make sure that both sides of the stepladder are locked into place. Ask someone to hold it for you, if possible. Clearly mark and make sure that you can see: Any grab bars or handrails. First and last steps. Where the edge of each step is. Use tools that help you move around (mobility aids) if they are needed. These include: Canes. Walkers. Scooters. Crutches. Turn on the lights when you go into a dark area. Replace any light bulbs as soon as they burn out. Set up your furniture so you have a clear path. Avoid moving your furniture around. If any of your floors are uneven, fix them. If there are any pets around you, be aware of where they are. Review your medicines with your doctor. Some medicines can make you feel dizzy. This can increase your chance of falling. Ask your doctor what other things that you can do to help prevent falls. This information is not intended to replace advice given to you by your health care provider. Make sure you discuss any questions you have with your health care provider. Document Released: 06/09/2009 Document Revised: 01/19/2016 Document Reviewed: 09/17/2014 Elsevier Interactive Patient Education  2017 [employer].

## 2022-08-14 DIAGNOSIS — L820 Inflamed seborrheic keratosis: Secondary | ICD-10-CM | POA: Diagnosis not present

## 2022-08-14 DIAGNOSIS — B353 Tinea pedis: Secondary | ICD-10-CM | POA: Diagnosis not present

## 2022-08-14 DIAGNOSIS — B351 Tinea unguium: Secondary | ICD-10-CM | POA: Diagnosis not present

## 2022-08-14 DIAGNOSIS — D2262 Melanocytic nevi of left upper limb, including shoulder: Secondary | ICD-10-CM | POA: Diagnosis not present

## 2022-08-14 DIAGNOSIS — Z85828 Personal history of other malignant neoplasm of skin: Secondary | ICD-10-CM | POA: Diagnosis not present

## 2022-08-14 DIAGNOSIS — D225 Melanocytic nevi of trunk: Secondary | ICD-10-CM | POA: Diagnosis not present

## 2022-08-14 DIAGNOSIS — L718 Other rosacea: Secondary | ICD-10-CM | POA: Diagnosis not present

## 2022-08-14 DIAGNOSIS — L814 Other melanin hyperpigmentation: Secondary | ICD-10-CM | POA: Diagnosis not present

## 2022-08-14 DIAGNOSIS — L821 Other seborrheic keratosis: Secondary | ICD-10-CM | POA: Diagnosis not present

## 2022-12-04 DIAGNOSIS — H524 Presbyopia: Secondary | ICD-10-CM | POA: Diagnosis not present

## 2022-12-04 DIAGNOSIS — H43813 Vitreous degeneration, bilateral: Secondary | ICD-10-CM | POA: Diagnosis not present

## 2022-12-04 DIAGNOSIS — H52203 Unspecified astigmatism, bilateral: Secondary | ICD-10-CM | POA: Diagnosis not present

## 2022-12-04 DIAGNOSIS — H26493 Other secondary cataract, bilateral: Secondary | ICD-10-CM | POA: Diagnosis not present

## 2022-12-04 DIAGNOSIS — H04123 Dry eye syndrome of bilateral lacrimal glands: Secondary | ICD-10-CM | POA: Diagnosis not present

## 2022-12-19 ENCOUNTER — Other Ambulatory Visit: Payer: Self-pay | Admitting: Family Medicine

## 2022-12-19 DIAGNOSIS — Z1231 Encounter for screening mammogram for malignant neoplasm of breast: Secondary | ICD-10-CM

## 2023-01-10 DIAGNOSIS — Z85828 Personal history of other malignant neoplasm of skin: Secondary | ICD-10-CM | POA: Diagnosis not present

## 2023-01-10 DIAGNOSIS — L820 Inflamed seborrheic keratosis: Secondary | ICD-10-CM | POA: Diagnosis not present

## 2023-01-10 DIAGNOSIS — L821 Other seborrheic keratosis: Secondary | ICD-10-CM | POA: Diagnosis not present

## 2023-02-04 ENCOUNTER — Ambulatory Visit
Admission: RE | Admit: 2023-02-04 | Discharge: 2023-02-04 | Disposition: A | Discharge status: Home / Self Care | Payer: PPO | Source: Ambulatory Visit | Attending: Family Medicine | Admitting: Family Medicine

## 2023-02-04 ENCOUNTER — Ambulatory Visit: Payer: PPO | Admitting: Family

## 2023-02-04 ENCOUNTER — Ambulatory Visit: Payer: PPO | Admitting: Family Medicine

## 2023-02-04 DIAGNOSIS — Z1231 Encounter for screening mammogram for malignant neoplasm of breast: Secondary | ICD-10-CM | POA: Diagnosis not present

## 2023-02-05 ENCOUNTER — Encounter: Payer: Self-pay | Admitting: Family Medicine

## 2023-02-05 ENCOUNTER — Ambulatory Visit (INDEPENDENT_AMBULATORY_CARE_PROVIDER_SITE_OTHER): Payer: PPO | Admitting: Family Medicine

## 2023-02-05 VITALS — BP 120/58 | HR 67 | Temp 97.3°F | Ht 64.25 in | Wt 157.0 lb

## 2023-02-05 DIAGNOSIS — J0110 Acute frontal sinusitis, unspecified: Secondary | ICD-10-CM

## 2023-02-05 MED ORDER — DOXYCYCLINE HYCLATE 100 MG PO TABS: 100.0000 mg | ORAL_TABLET | Freq: Two times a day (BID) | ORAL | 0 refills | Status: DC

## 2023-02-05 MED ORDER — PREDNISONE 10 MG PO TABS: ORAL_TABLET | ORAL | 0 refills | Status: DC

## 2023-02-05 NOTE — Progress Notes (Unsigned)
Nasal congestion and dull headache near frontal sinuses since Thursday. Patient has been getting yellow mucus up. Has been taking tylenol and alka-seltzer.  Cough is worse than typical for patient.  Chest is sore from coughing.  Some occ wheeze, at the end of the cough.  Deep breath triggers a cough.  No fevers.  No ear pain.  No vomiting, no diarrhea.  Took alka seltzer and tussin in the meantime.  She had h/o tachycardia with SABA and with pseudophed.    Meds, vitals, and allergies reviewed.   ROS: Per HPI unless specifically indicated in ROS section   Nad Ncat TM wnl B Medial frontal sinuses ttp B Discolored nasal discharge.  Neck supple, no LA rrr Ctab Cough noted.  Skin well-perfused.

## 2023-02-05 NOTE — Patient Instructions (Addendum)
Start doxy- sunburn caution.   Rest and fluids.  Update [place] as needed.   Prednisone with food.

## 2023-02-06 DIAGNOSIS — J0110 Acute frontal sinusitis, unspecified: Secondary | ICD-10-CM | POA: Insufficient documentation

## 2023-02-06 NOTE — Assessment & Plan Note (Signed)
Start doxy- sunburn caution.   Rest and fluids.  Update [place] as needed.   Prednisone with food.   Steroid and antibiotic cautions given to patient.  Okay for outpatient follow-up.  She agrees to plan.

## 2023-05-08 ENCOUNTER — Ambulatory Visit (INDEPENDENT_AMBULATORY_CARE_PROVIDER_SITE_OTHER): Payer: PPO | Admitting: Emergency Medicine

## 2023-05-08 VITALS — Ht 65.0 in | Wt 160.0 lb

## 2023-05-08 DIAGNOSIS — Z1211 Encounter for screening for malignant neoplasm of colon: Secondary | ICD-10-CM

## 2023-05-08 DIAGNOSIS — Z0000 Encounter for general adult medical examination without abnormal findings: Secondary | ICD-10-CM

## 2023-05-08 NOTE — Progress Notes (Signed)
Subjective:   Alisha Long is a 72 y.o. female who presents for Medicare Annual (Subsequent) preventive examination.  Visit Complete: Virtual  I connected with  Tana Felts on 05/08/23 by a audio enabled telemedicine application and verified that I am speaking with the correct person using two identifiers.  Patient Location: Home  Provider Location: Home Office  I discussed the limitations of evaluation and management by telemedicine. The patient expressed understanding and agreed to proceed.  Patient Medicare AWV questionnaire was completed by the patient on 05/04/23; I have confirmed that all information answered by patient is correct and no changes since this date.  Vital Signs: Because this visit was a virtual/telehealth visit, some criteria may be missing or patient reported. Any vitals not documented were not able to be obtained and vitals that have been documented are patient reported.    Review of Systems     Cardiac Risk Factors include: advanced age (>48men, >89 women);dyslipidemia     Objective:    Today's Vitals   05/08/23 1246  Weight: 160 lb (72.6 kg)  Height: 5\' 5"  (1.651 m)   Body mass index is 26.63 kg/m.     05/08/2023   12:57 PM 08/13/2022    2:10 PM 08/02/2021    8:16 AM 07/29/2020    8:15 AM 05/23/2018   11:34 AM 07/31/2016    7:30 AM 07/17/2016    9:53 AM  Advanced Directives  Does Patient Have a Medical Advance Directive? Yes Yes Yes Yes Yes Yes Yes  Type of Estate agent of [place];Living will Healthcare Power of [place];Living will Healthcare Power of [place];Living will Healthcare Power of [place];Living will Living will    Does patient want to make changes to medical advance directive? No - Patient declined No - Patient declined Yes (ED - Information included in AVS)      Copy of Healthcare Power of Attorney in Chart? No - copy requested No - copy requested  No - copy requested     Would patient like information  on creating a medical advance directive?     No - Patient declined      Current Medications (verified) Outpatient Encounter Medications as of 05/08/2023  Medication Sig   acyclovir (ZOVIRAX) 400 MG tablet Take 1 tablet (400 mg total) by mouth 3 (three) times daily. For 5 days for cold sore   ciclopirox (LOPROX) 0.77 % cream Apply topically.   clindamycin (CLEOCIN T) 1 % lotion Apply topically every morning.   cyclobenzaprine (FLEXERIL) 10 MG tablet Take 1 tablet (10 mg total) by mouth 3 (three) times daily as needed for muscle spasms. Caution of sedation   Multiple Vitamins-Minerals (MULTIVITAMIN ADULTS 50+ PO) Take 1 capsule by mouth daily.   doxycycline (VIBRA-TABS) 100 MG tablet Take 1 tablet (100 mg total) by mouth 2 (two) times daily. (Patient not taking: Reported on 05/08/2023)   predniSONE (DELTASONE) 10 MG tablet Take 2 a day for 5 days, then 1 a day for 5 days, with food. Don't take with aleve/ibuprofen. (Patient not taking: Reported on 05/08/2023)   triamcinolone cream (KENALOG) 0.1 % Apply 1 application topically 2 (two) times daily. To affected areas (Patient not taking: Reported on 05/08/2023)   No facility-administered encounter medications on file as of 05/08/2023.    Allergies (verified) Penicillins, Albuterol, Amoxicillin, Atorvastatin, Bactrim [sulfamethoxazole-trimethoprim], Cephalexin, Gabapentin, Mometasone furoate, Pseudoephedrine, and Rosuvastatin   History: Past Medical History:  Diagnosis Date   Colon polyps    History of shingles  Hyperlipidemia    Hypertension    Insomnia    Palpitations    Post-nasal drip    with chronic cough   Past Surgical History:  Procedure Laterality Date   ABDOMINAL HYSTERECTOMY     bleeding, ovaries intact   BREAST EXCISIONAL BIOPSY Left    CARPAL TUNNEL RELEASE  11/2004   CESAREAN SECTION     x 3   CHOLECYSTECTOMY  12/2005   COLONOSCOPY     polyps   ESOPHAGOGASTRODUODENOSCOPY     nml   gallstones     Abd [place]    KNEE  SURGERY     10/2000   KNEE SURGERY  01/2001   replaced ACL   Family History  Problem Relation Age of Onset   Arthritis Mother    Hyperlipidemia Mother    Seizures Father    Aneurysm Maternal Grandmother    Hyperlipidemia Maternal Grandmother    Hypertension Maternal Grandmother    Colon cancer Neg Hx    Breast cancer Neg Hx    Social History   Socioeconomic History   Marital status: Married    Spouse name: Ree Kida   Number of children: 3   Years of education: Not on file   Highest education level: Not on file  Occupational History   Occupation: Engineer, 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  gets regular exercise.    Social Determinants of Health   Financial Resource Strain: Low Risk  (05/04/2023)   Overall Financial Resource Strain (CARDIA)    Difficulty of Paying Living Expenses: Not hard at all  Food Insecurity: No Food Insecurity (05/04/2023)   Hunger Vital Sign    Worried About Running Out of Food in the Last Year: Never true    Ran Out of Food in the Last Year: Never true  Transportation Needs: No Transportation Needs (05/04/2023)   PRAPARE - Administrator, Civil Service (Medical): No    Lack of Transportation (Non-Medical): No  Physical Activity: Sufficiently Active (05/04/2023)   Exercise Vital Sign    Days of Exercise per Week:  3 days    Minutes of Exercise per Session: 60 min  Stress: No Stress Concern Present (05/04/2023)   [employer] of Occupational Health - Occupational Stress Questionnaire    Feeling of Stress : Not at all  Social Connections: Socially Integrated (05/04/2023)   Social Connection and Isolation Panel [NHANES]    Frequency of Communication with Friends and Family: More than three times a week    Frequency of Social Gatherings with Friends and Family: More than three times a week    Attends Religious Services: More than 4 times per year    Active Member of Clubs or  Organizations: Yes    Attends Engineer, structural: More than 4 times per year    Marital Status: Married    Tobacco Counseling Counseling given: Not Answered   Clinical Intake:  Pre-visit preparation completed: Yes  Pain : No/denies pain     BMI - recorded: 26.63 Nutritional Status: BMI 25 -29 Overweight Nutritional Risks: None Diabetes: No  How often do you need to have someone help you when you read instructions, pamphlets, or other written materials from your doctor or pharmacy?: 1 - Never  Interpreter Needed?: No  Information entered by :: Tora Kindred, CMA   Activities of Daily Living    05/04/2023   10:54 PM 08/13/2022    2:11 PM  In your present state of health, do you have any difficulty performing the following activities:  Hearing? 0 0  Vision? 0 0  Difficulty concentrating or making decisions? 0 0  Walking or climbing stairs? 0 0  Dressing or bathing? 0 0  Doing errands, shopping? 0 0  Preparing Food and eating ? N N  Using the Toilet? N N  In the past six months, have you accidently leaked urine? N N  Do you have problems with loss of bowel control? N N  Managing your Medications? N N  Managing your Finances? N N  Housekeeping or managing your Housekeeping? N N    Patient Care Team: Tower, Audrie Gallus, MD as PCP - General  Indicate any recent Medical Services you may have received  from other than [facility] providers in the past year (date may be approximate).     Assessment:   This is a routine wellness examination for [place].  Hearing/Vision screen Hearing Screening - Comments:: Denies hearing loss Vision Screening - Comments:: Gets routine eye exams   Goals Addressed               This Visit's Progress     Patient Stated (pt-stated)        Maintain current health      Depression Screen    05/08/2023   12:55 PM 08/13/2022    2:09 PM 08/09/2022    9:33 AM 08/02/2021    8:19 AM 07/29/2020    8:17 AM 07/11/2020    3:32 PM 07/06/2019    2:34 PM  PHQ 2/9 Scores  PHQ - 2 Score 0 0 0 0 0 0 0  PHQ- 9 Score 0 0 0  0      Fall Risk    05/04/2023   10:54 PM 02/05/2023   12:08 PM 08/13/2022    2:11 PM 08/12/2022    5:17 PM 08/02/2021    8:17 AM  Fall Risk   Falls in the past year? 0 0 0 0 0  Number falls in past yr: 0 0 0 0 0  Injury with Fall? 0 0 0 0 0  Risk for fall due to : No Fall Risks No Fall Risks   No Fall Risks  Follow up Falls prevention discussed Falls evaluation completed Falls evaluation completed;Falls prevention discussed  Falls prevention discussed    MEDICARE RISK AT HOME: Medicare Risk at Home Any stairs in or around the home?: Yes If so, are there any without handrails?: No Home free of loose throw rugs in walkways, pet beds, electrical cords, etc?: Yes Adequate lighting in your home to reduce risk of falls?: Yes Life alert?: No Use of a cane, walker or w/c?: No Grab bars in the bathroom?: No Shower chair or  bench in shower?: Yes Elevated toilet seat or a handicapped toilet?: Yes  TIMED UP AND GO:  Was the test performed?  No    Cognitive Function:    07/29/2020    8:21 AM 05/23/2018   11:34 AM  MMSE - Mini Mental State Exam  Orientation to time 5 5  Orientation to Place 5 5  Registration 3 3  Attention/ Calculation 5 0  Recall 3 3  Language- name 2 objects  0  Language- repeat 1 1  Language- follow 3 step command  3   Language- read & follow direction  0  Write a sentence  0  Copy design  0  Total score  20        05/08/2023   12:58 PM 08/13/2022    2:11 PM  6CIT Screen  What Year? 0 points 0 points  What month? 0 points 0 points  What time? 0 points 0 points  Count back from 20 0 points 0 points  Months in reverse 0 points 0 points  Repeat phrase 0 points 0 points  Total Score 0 points 0 points    Immunizations Immunization History  Administered Date(s) Administered   Fluad Quad(high Dose 65+) 07/06/2019, 07/11/2020   Influenza, High Dose Seasonal PF 08/01/2021   Influenza,inj,Quad PF,6+ Mos 06/30/2014, 05/13/2015, 05/14/2016, 05/21/2017, 05/23/2018   Influenza-Unspecified 08/01/2021, 07/21/2022   PFIZER(Purple Top)SARS-COV-2 Vaccination 10/01/2019, 10/22/2019, 06/01/2020   Pneumococcal Conjugate-13 05/21/2017   Pneumococcal Polysaccharide-23 05/23/2018   Td 04/27/2003, 09/09/2013   Tdap 05/14/2016   Zoster, Live 05/06/2012    TDAP status: Up to date  Flu Vaccine status: Due, Education has been provided regarding the importance of this vaccine. Advised may receive this vaccine at local pharmacy or Health Dept. Aware to provide a copy of the vaccination record if obtained from local pharmacy or Health Dept. Verbalized acceptance and understanding.  Pneumococcal vaccine status: Up to date  Covid-19 vaccine status: Declined, Education has been provided regarding the importance of this vaccine but patient still declined. Advised may receive this vaccine at local pharmacy or Health Dept.or vaccine clinic. Aware to provide a copy of the vaccination record if obtained from local pharmacy or Health Dept. Verbalized acceptance and understanding.  Qualifies for Shingles Vaccine? Yes   Zostavax completed No   Shingrix Completed?: No.    Education has been provided regarding the importance of this vaccine. Patient has been advised to call insurance company to determine out of pocket expense if  they have not yet received this vaccine. Advised may also receive vaccine at local pharmacy or Health Dept. Verbalized acceptance and understanding.  Screening Tests Health Maintenance  Topic Date Due   Zoster Vaccines- Shingrix (1 of 2) [birth year]/1971   Colonoscopy  07/31/2021   INFLUENZA VACCINE  03/28/2023   COVID-19 Vaccine (4 - 2023-24 season) 04/28/2023   MAMMOGRAM  02/04/2024   Medicare Annual Wellness (AWV)  05/07/2024   DTaP/Tdap/Td (4 - Td or Tdap) 05/14/2026   Pneumonia Vaccine 70+ Years old  Completed   DEXA SCAN  Completed   Hepatitis C Screening  Completed   HPV VACCINES  Aged Out    Health Maintenance  Health Maintenance Due  Topic Date Due   Zoster Vaccines- Shingrix (1 of 2) 04[birth year]04/1971   Colonoscopy  07/31/2021   INFLUENZA VACCINE  03/28/2023   COVID-19 Vaccine (4 - 2023-24 season) 04/28/2023    Colorectal cancer screening: Type of screening: Colonoscopy. Completed 07/31/16. Repeat every 5 years Referral placed.  Mammogram status:  Completed 02/04/23. Repeat every year  Bone Density status: Completed 01/30/22. Results reflect: Bone density results: NORMAL. Repeat every 5 years.  Lung Cancer Screening: (Low Dose CT Chest recommended if Age 65-80 years, 20 pack-year currently smoking OR have quit w/in 15years.) does not qualify.   Lung Cancer Screening Referral: n/a  Additional Screening:  Hepatitis C Screening: does not qualify; Completed 05/14/2016  Vision Screening: Recommended annual ophthalmology exams for early detection of glaucoma and other disorders of the eye.  Dental Screening: Recommended annual dental exams for proper oral hygiene   Community Resource Referral / Chronic Care Management: CRR required this visit?  No   CCM required this visit?  No     Plan:     I have personally reviewed and noted the following in the patient's chart:   Medical and social history Use of alcohol, tobacco or illicit drugs  Current medications and supplements  including opioid prescriptions. Patient is not currently taking opioid prescriptions. Functional ability and status Nutritional status Physical activity Advanced directives List of other physicians Hospitalizations, surgeries, and ER visits in previous 12 months Vitals Screenings to include cognitive, depression, and falls Referrals and appointments  In addition, I have reviewed and discussed with patient certain preventive protocols, quality metrics, and best practice recommendations. A written personalized care plan for preventive services as well as general preventive health recommendations were provided to patient.     Tora Kindred, CMA   05/08/2023   After Visit Summary: (MyChart) Due to this being a telephonic visit, the after visit summary with patients personalized plan was offered to patient via MyChart   Nurse Notes:  Placed a referral to GI for colonoscopy. Patient will get the flu shot in the fall Declined covid and shingles vaccines.

## 2023-05-08 NOTE — Patient Instructions (Addendum)
Alisha Long , Thank you for taking time to come for your Medicare Wellness Visit. I appreciate your ongoing commitment to your health goals. Please review the following plan we discussed and let me know if I can assist you in the future.   Referrals/Orders/Follow-Ups/Clinician Recommendations: I have placed a referral to [facility] for a colonoscopy. Someone should call and schedule an appointment in the next few weeks. Get the flu shot this fall and planned  This is a list of the screening recommended for you and due dates:  Health Maintenance  Topic Date Due   Zoster (Shingles) Vaccine (1 of 2) 04/04/1971   Colon Cancer Screening  07/31/2021   Flu Shot  03/28/2023   COVID-19 Vaccine (4 - 2023-24 season) 04/28/2023   Mammogram  02/04/2024   Medicare Annual Wellness Visit  05/07/2024   DTaP/Tdap/Td vaccine (4 - Td or Tdap) 05/14/2026   DEXA scan (bone density measurement)  01/31/2027   Pneumonia Vaccine  Completed   Hepatitis C Screening  Completed   HPV Vaccine  Aged Out    Advanced directives: (Copy Requested) Please bring a copy of your health care power of attorney and living will to the office to be added to your chart at your convenience.  Next Medicare Annual Wellness Visit scheduled for next year: Yes, 05/18/24 @ 11:00am

## 2023-05-22 ENCOUNTER — Encounter: Payer: Self-pay | Admitting: Internal Medicine

## 2023-06-17 DIAGNOSIS — L820 Inflamed seborrheic keratosis: Secondary | ICD-10-CM | POA: Diagnosis not present

## 2023-06-17 DIAGNOSIS — L821 Other seborrheic keratosis: Secondary | ICD-10-CM | POA: Diagnosis not present

## 2023-06-17 DIAGNOSIS — Z85828 Personal history of other malignant neoplasm of skin: Secondary | ICD-10-CM | POA: Diagnosis not present

## 2023-06-17 DIAGNOSIS — L570 Actinic keratosis: Secondary | ICD-10-CM | POA: Diagnosis not present

## 2023-07-09 ENCOUNTER — Ambulatory Visit (AMBULATORY_SURGERY_CENTER): Payer: PPO

## 2023-07-09 VITALS — Ht 64.0 in | Wt 160.0 lb

## 2023-07-09 DIAGNOSIS — Z860100 Personal history of colon polyps, unspecified: Secondary | ICD-10-CM

## 2023-07-09 MED ORDER — NA SULFATE-K SULFATE-MG SULF 17.5-3.13-1.6 GM/177ML PO SOLN
1.0000 | Freq: Once | ORAL | 0 refills | Status: AC
Start: 2023-07-09 — End: 2023-07-09

## 2023-07-09 NOTE — Progress Notes (Signed)

## 2023-07-11 ENCOUNTER — Encounter: Payer: Self-pay | Admitting: Internal Medicine

## 2023-07-30 ENCOUNTER — Ambulatory Visit: Payer: PPO | Admitting: Internal Medicine

## 2023-07-30 ENCOUNTER — Encounter: Payer: Self-pay | Admitting: Internal Medicine

## 2023-07-30 VITALS — BP 119/74 | HR 76 | Temp 98.1°F | Resp 23 | Ht 64.0 in | Wt 160.0 lb

## 2023-07-30 DIAGNOSIS — D122 Benign neoplasm of ascending colon: Secondary | ICD-10-CM | POA: Diagnosis not present

## 2023-07-30 DIAGNOSIS — K5730 Diverticulosis of large intestine without perforation or abscess without bleeding: Secondary | ICD-10-CM | POA: Diagnosis not present

## 2023-07-30 DIAGNOSIS — K648 Other hemorrhoids: Secondary | ICD-10-CM | POA: Diagnosis not present

## 2023-07-30 DIAGNOSIS — Z860100 Personal history of colon polyps, unspecified: Secondary | ICD-10-CM | POA: Diagnosis not present

## 2023-07-30 DIAGNOSIS — I10 Essential (primary) hypertension: Secondary | ICD-10-CM | POA: Diagnosis not present

## 2023-07-30 DIAGNOSIS — Z860101 Personal history of adenomatous and serrated colon polyps: Secondary | ICD-10-CM | POA: Diagnosis not present

## 2023-07-30 DIAGNOSIS — Z1211 Encounter for screening for malignant neoplasm of colon: Secondary | ICD-10-CM

## 2023-07-30 MED ORDER — SODIUM CHLORIDE 0.9 % IV SOLN
500.0000 mL | Freq: Once | INTRAVENOUS | Status: DC
Start: 2023-07-30 — End: 2023-07-30

## 2023-07-30 NOTE — Progress Notes (Signed)
Report to PACU, RN, vss, BBS= Clear.  

## 2023-07-30 NOTE — Progress Notes (Signed)
GASTROENTEROLOGY PROCEDURE H&P NOTE   Primary Care Physician: Tower, Audrie Gallus, MD    Reason for Procedure:   History of colon polyps  Plan:    Colonoscopy  Patient is appropriate for endoscopic procedure(s) in the ambulatory ([facility]) setting.  The nature of the procedure, as well as the risks, benefits, and alternatives were carefully and thoroughly reviewed with the patient. Ample time for discussion and questions allowed. The patient understood, was satisfied, and agreed to proceed.     HPI: Alisha Long is a 72 y.o. female who presents for colonoscopy for history of colon polyps. Denies blood in stools, changes in bowel habits, or unintentional weight loss. Grandfather had colon cancer.  Colonoscopy 07/31/16:- Two 2 to 3 mm polyps in the sigmoid colon and in the ascending colon, removed with a cold snare. Resected and retrieved. - The examination was otherwise normal on direct and retroflexion views. Path: Surgical [P], sigmoid and ascending, polyp (2) - TUBULAR ADENOMA, 2 FRAGMENTS. - NO HIGH GRADE DYSPLASIA OR MALIGNANCY IDENTIFIED.   Past Medical History:  Diagnosis Date   Colon polyps    History of shingles    Hyperlipidemia    Hypertension    Insomnia    Palpitations    Post-nasal drip    with chronic cough    Past Surgical History:  Procedure Laterality Date   ABDOMINAL HYSTERECTOMY     bleeding, ovaries intact   BREAST EXCISIONAL BIOPSY Left    CARPAL TUNNEL RELEASE  11/2004   CESAREAN SECTION     x 3   CHOLECYSTECTOMY  12/2005   COLONOSCOPY     polyps   ESOPHAGOGASTRODUODENOSCOPY     nml   gallstones     Abd [place]    KNEE SURGERY     10/2000   KNEE SURGERY  01/2001   replaced ACL    Prior to Admission medications   Medication Sig Start Date End Date Taking? Authorizing Provider  clindamycin (CLEOCIN T) 1 % lotion Apply topically every morning. 01/02/21  Yes [provider]  Multiple Vitamins-Minerals (MULTIVITAMIN ADULTS 50+ PO) Take  1 capsule by mouth daily.   Yes [provider]  acyclovir (ZOVIRAX) 400 MG tablet Take 1 tablet (400 mg total) by mouth 3 (three) times daily. For 5 days for cold sore 01/04/22   Tower, Audrie Gallus, MD  ciclopirox (LOPROX) 0.77 % cream Apply topically. 05/26/20   [provider]  cyclobenzaprine (FLEXERIL) 10 MG tablet Take 1 tablet (10 mg total) by mouth 3 (three) times daily as needed for muscle spasms. Caution of sedation 08/07/21   Tower, Audrie Gallus, MD  doxycycline (VIBRA-TABS) 100 MG tablet Take 1 tablet (100 mg total) by mouth 2 (two) times daily. Patient not taking: Reported on 05/08/2023 02/05/23   Joaquim Nam, MD  predniSONE (DELTASONE) 10 MG tablet Take 2 a day for 5 days, then 1 a day for 5 days, with food. Don't take with aleve/ibuprofen. Patient not taking: Reported on 05/08/2023 02/05/23   Joaquim Nam, MD  triamcinolone cream (KENALOG) 0.1 % Apply 1 application topically 2 (two) times daily. To affected areas 03/03/18   Tower, Audrie Gallus, MD    Current Outpatient Medications  Medication Sig Dispense Refill   clindamycin (CLEOCIN T) 1 % lotion Apply topically every morning.     Multiple Vitamins-Minerals (MULTIVITAMIN ADULTS 50+ PO) Take 1 capsule by mouth daily.     acyclovir (ZOVIRAX) 400 MG tablet Take 1 tablet (400 mg total) by  mouth 3 (three) times daily. For 5 days for cold sore 45 tablet 3   ciclopirox (LOPROX) 0.77 % cream Apply topically.     cyclobenzaprine (FLEXERIL) 10 MG tablet Take 1 tablet (10 mg total) by mouth 3 (three) times daily as needed for muscle spasms. Caution of sedation 30 tablet 0   doxycycline (VIBRA-TABS) 100 MG tablet Take 1 tablet (100 mg total) by mouth 2 (two) times daily. (Patient not taking: Reported on 05/08/2023) 14 tablet 0   predniSONE (DELTASONE) 10 MG tablet Take 2 a day for 5 days, then 1 a day for 5 days, with food. Don't take with aleve/ibuprofen. (Patient not taking: Reported on 05/08/2023) 15 tablet 0   triamcinolone cream  (KENALOG) 0.1 % Apply 1 application topically 2 (two) times daily. To affected areas 30 g 0   Current Facility-Administered Medications  Medication Dose Route Frequency Provider Last Rate Last Admin   0.9 %  sodium chloride infusion  500 mL Intravenous Once Imogene Burn, MD        Allergies as of 07/30/2023 - Review Complete 07/30/2023  Allergen Reaction Noted   Penicillins Shortness Of Breath 06/11/2007   Albuterol  02/05/2023   Amoxicillin  06/11/2007   Atorvastatin  06/11/2007   Bactrim [sulfamethoxazole-trimethoprim] Hives 02/26/2014   Cephalexin  06/11/2007   Gabapentin Other (See Comments) 02/22/2022   Mometasone furoate  06/11/2007   Pseudoephedrine  02/05/2023   Rosuvastatin  06/11/2007    Family History  Problem Relation Age of Onset   Arthritis Mother    Hyperlipidemia Mother    Seizures Father    Aneurysm Maternal Grandmother    Hyperlipidemia Maternal Grandmother    Hypertension Maternal Grandmother    Colon cancer Maternal Grandfather    Breast cancer Neg Hx    Colon polyps Neg Hx    Esophageal cancer Neg Hx    Stomach cancer Neg Hx    Ulcerative colitis Neg Hx     Social History   Socioeconomic History   Marital status: Married    Spouse name: Ree Kida   Number of children: 3   Years of education: Not on file   Highest education level: Not on file  Occupational History   Occupation: Engineer, 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  gets regular exercise.    Social Determinants of Health   Financial Resource Strain: Low Risk  (05/04/2023)   Overall Financial Resource Strain (CARDIA)    Difficulty of Paying Living Expenses: Not hard at all  Food Insecurity: No Food Insecurity (05/04/2023)   Hunger Vital Sign    Worried About Running Out of Food in the Last Year: Never true    Ran Out of Food in  the Last Year: Never true  Transportation Needs: No Transportation Needs (05/04/2023)   PRAPARE - Administrator, Civil Service (Medical): No    Lack of Transportation (Non-Medical): No  Physical Activity: Sufficiently Active (05/04/2023)   Exercise Vital Sign    Days of Exercise per Week: 3 days    Minutes of Exercise per Session: 60 min  Stress: No Stress Concern Present (05/04/2023)   [employer] of Occupational Health - Occupational Stress Questionnaire    Feeling of Stress : Not at all  Social Connections: Socially Integrated (05/04/2023)   Social Connection and Isolation Panel [NHANES]    Frequency of Communication with Friends and Family: More than three times a week    Frequency of Social Gatherings with Friends and Family: More than three times a week    Attends Religious Services: More than 4 times per year    Active Member of [employer] or Organizations: Yes    Attends Banker Meetings: More than 4 times per year    Marital Status: Married  Catering manager Violence: Not At Risk (05/08/2023)   Humiliation, Afraid, Rape, and Kick questionnaire    Fear of Current or Ex-Partner: No    Emotionally Abused: No    Physically Abused: No    Sexually Abused: No    Physical Exam: Vital signs in last 24 hours: BP 126/65   Pulse 84   Temp 98.1 F (36.7 C)   Ht 5\' 4"  (1.626 m)   Wt 160 lb (72.6 kg)   SpO2 98%   BMI 27.46 kg/m  GEN: NAD EYE: Sclerae anicteric ENT: MMM CV:  Non-tachycardic Pulm: No increased work of breathing GI: Soft, NT/ND NEURO:  Alert & Oriented   Eulah Pont, MD [facility]  07/30/2023 9:39 AM

## 2023-07-30 NOTE — Progress Notes (Signed)
Pt's states no medical or surgical changes since previsit or office visit. 

## 2023-07-30 NOTE — Progress Notes (Signed)
Called to room to assist during endoscopic procedure.  Patient ID and intended procedure confirmed with present staff. Received instructions for my participation in the procedure from the performing physician.  

## 2023-07-30 NOTE — Patient Instructions (Addendum)
- Discharge patient to home (with escort).                           - Await pathology results.                           - The findings and recommendations were discussed                            with the patient. (3 polyps and hemorrhoids)  YOU HAD AN ENDOSCOPIC PROCEDURE TODAY AT THE [facility]:   Refer to the procedure report that was given to you for any specific questions about what was found during the examination.  If the procedure report does not answer your questions, please call your gastroenterologist to clarify.  If you requested that your care partner not be given the details of your procedure findings, then the procedure report has been included in a sealed envelope for you to review at your convenience later.  YOU SHOULD EXPECT: Some feelings of bloating in the abdomen. Passage of more gas than usual.  Walking can help get rid of the air that was put into your GI tract during the procedure and reduce the bloating. If you had a lower endoscopy (such as a colonoscopy or flexible sigmoidoscopy) you may notice spotting of blood in your stool or on the toilet paper. If you underwent a bowel prep for your procedure, you may not have a normal bowel movement for a few days.  Please Note:  You might notice some irritation and congestion in your nose or some drainage.  This is from the oxygen used during your procedure.  There is no need for concern and it should clear up in a day or so.  SYMPTOMS TO REPORT IMMEDIATELY:  Following lower endoscopy (colonoscopy or flexible sigmoidoscopy):  Excessive amounts of blood in the stool  Significant tenderness or worsening of abdominal pains  Swelling of the abdomen that is new, acute  Fever of 100F or higher   For urgent or emergent issues, a gastroenterologist can be reached at any hour by calling [phone]. Do not use MyChart messaging for urgent concerns.    DIET:  We do recommend a small meal  at first, but then you may proceed to your regular diet.  Drink plenty of fluids but you should avoid alcoholic beverages for 24 hours.  ACTIVITY:  You should plan to take it easy for the rest of today and you should NOT DRIVE or use heavy machinery until tomorrow (because of the sedation medicines used during the test).    FOLLOW UP: Our staff will call the number listed on your records the next business day following your procedure.  We will call around 7:15- 8:00 am to check on you and address any questions or concerns that you may have regarding the information given to you following your procedure. If we do not reach you, we will leave a message.     If any biopsies were taken you will be contacted by phone or by letter within the next 1-3 weeks.  Please call us at [phone] if you have not heard about the biopsies in 3 weeks.    SIGNATURES/CONFIDENTIALITY: You and/or your care partner have signed paperwork which will be entered into your electronic medical record.  These signatures attest to the fact that  that the information above on your After Visit Summary has been reviewed and is understood.  Full responsibility of the confidentiality of this discharge information lies with you and/or your care-partner.

## 2023-07-30 NOTE — Op Note (Signed)
[facility] Patient Name: Alisha Long Procedure Date: 07/30/2023 9:44 AM MRN: [id number] Endoscopist: Madelyn Brunner [place] , , [id number] Age: 72 Referring MD:  Date of Birth: [birth year] Gender: Female Account #: [id number] Procedure:                Colonoscopy Indications:              High risk colon cancer surveillance: Personal                            history of colonic polyps Medicines:                Monitored Anesthesia Care Procedure:                Pre-Anesthesia Assessment:                           - Prior to the procedure, a History and Physical                            was performed, and patient medications and                            allergies were reviewed. The patient's tolerance of                            previous anesthesia was also reviewed. The risks                            and benefits of the procedure and the sedation                            options and risks were discussed with the patient.                            All questions were answered, and informed consent                            was obtained. Prior Anticoagulants: The patient has                            taken no anticoagulant or antiplatelet agents. ASA                            Grade Assessment: II - A patient with mild systemic                            disease. After reviewing the risks and benefits,                            the patient was deemed in satisfactory condition to                            undergo the procedure.  After obtaining informed consent, the colonoscope                            was passed under direct vision. Throughout the                            procedure, the patient's blood pressure, pulse, and                            oxygen saturations were monitored continuously. The                            Olympus Scope SN: [id number] was introduced through                            the anus and advanced to the  the terminal ileum.                            The colonoscopy was performed without difficulty.                            The patient tolerated the procedure well. The                            quality of the bowel preparation was good. The                            terminal ileum, ileocecal valve, appendiceal                            orifice, and rectum were photographed. Scope In: 9:50:00 AM Scope Out: 10:11:28 AM Scope Withdrawal Time: 0 hours 13 minutes 59 seconds  Total Procedure Duration: 0 hours 21 minutes 28 seconds  Findings:                 The terminal ileum appeared normal.                           Three sessile polyps were found in the ascending                            colon. The polyps were 3 to 5 mm in size. These                            polyps were removed with a cold snare. Resection                            and retrieval were complete.                           A few diverticula were found in the sigmoid colon.                           Non-bleeding internal hemorrhoids were found during  retroflexion. Complications:            No immediate complications. Estimated Blood Loss:     Estimated blood loss was minimal. Impression:               - The examined portion of the ileum was normal.                           - Three 3 to 5 mm polyps in the ascending colon,                            removed with a cold snare. Resected and retrieved.                           - Diverticulosis in the sigmoid colon.                           - Non-bleeding internal hemorrhoids. Recommendation:           - Discharge patient to home (with escort).                           - Await pathology results.                           - The findings and recommendations were discussed                            with the patient. Dr Particia Lather "Alan Ripper" Leonides Schanz,  07/30/2023 10:16:46 AM

## 2023-07-31 ENCOUNTER — Telehealth: Payer: Self-pay | Admitting: *Deleted

## 2023-07-31 NOTE — Telephone Encounter (Signed)
  Follow up Call-     07/30/2023    8:56 AM  Call back number  Post procedure Call Back phone  # [phone]  Permission to leave phone message Yes     Patient questions:  Do you have a fever, pain , or abdominal swelling? No. Pain Score  0 *  Have you tolerated food without any problems? Yes.    Have you been able to return to your normal activities? Yes.    Do you have any questions about your discharge instructions: Diet   No. Medications  No. Follow up visit  No.  Do you have questions or concerns about your Care? No.  Actions: * If pain score is 4 or above: No action needed, pain <4.

## 2023-08-01 ENCOUNTER — Encounter: Payer: Self-pay | Admitting: Internal Medicine

## 2023-08-01 LAB — SURGICAL PATHOLOGY

## 2023-08-04 ENCOUNTER — Telehealth: Payer: Self-pay | Admitting: Family Medicine

## 2023-08-04 DIAGNOSIS — R7309 Other abnormal glucose: Secondary | ICD-10-CM

## 2023-08-04 DIAGNOSIS — Z862 Personal history of diseases of the blood and blood-forming organs and certain disorders involving the immune mechanism: Secondary | ICD-10-CM

## 2023-08-04 DIAGNOSIS — E7800 Pure hypercholesterolemia, unspecified: Secondary | ICD-10-CM

## 2023-08-04 NOTE — Telephone Encounter (Signed)
-----   Message from Vincenza Hews sent at 07/22/2023  2:17 PM EST ----- Regarding: Lab Mon 08/05/23 Hello,  Patient is coming in for CPE labs on Monday 08/05/23. Can we get orders please.   Thanks

## 2023-08-05 ENCOUNTER — Other Ambulatory Visit (INDEPENDENT_AMBULATORY_CARE_PROVIDER_SITE_OTHER): Payer: PPO

## 2023-08-05 DIAGNOSIS — Z862 Personal history of diseases of the blood and blood-forming organs and certain disorders involving the immune mechanism: Secondary | ICD-10-CM | POA: Diagnosis not present

## 2023-08-05 DIAGNOSIS — R7309 Other abnormal glucose: Secondary | ICD-10-CM

## 2023-08-05 DIAGNOSIS — E7800 Pure hypercholesterolemia, unspecified: Secondary | ICD-10-CM | POA: Diagnosis not present

## 2023-08-05 LAB — COMPREHENSIVE METABOLIC PANEL
ALT: 13 U/L (ref 0–35)
AST: 16 U/L (ref 0–37)
Albumin: 4.2 g/dL (ref 3.5–5.2)
Alkaline Phosphatase: 123 U/L — ABNORMAL HIGH (ref 39–117)
BUN: 11 mg/dL (ref 6–23)
CO2: 29 meq/L (ref 19–32)
Calcium: 10.2 mg/dL (ref 8.4–10.5)
Chloride: 106 meq/L (ref 96–112)
Creatinine, Ser: 0.72 mg/dL (ref 0.40–1.20)
GFR: 84.17 mL/min (ref >60.00)
Glucose, Bld: 96 mg/dL (ref 70–99)
Potassium: 4.3 meq/L (ref 3.5–5.1)
Sodium: 143 meq/L (ref 135–145)
Total Bilirubin: 0.6 mg/dL (ref 0.2–1.2)
Total Protein: 6.3 g/dL (ref 6.0–8.3)

## 2023-08-05 LAB — LIPID PANEL
Cholesterol: 227 mg/dL — ABNORMAL HIGH (ref 0–200)
HDL: 50.2 mg/dL (ref >39.00)
LDL Cholesterol: 131 mg/dL — ABNORMAL HIGH (ref 0–99)
NonHDL: 177.04
Total CHOL/HDL Ratio: 5
Triglycerides: 232 mg/dL — ABNORMAL HIGH (ref 0.0–149.0)
VLDL: 46.4 mg/dL — ABNORMAL HIGH (ref 0.0–40.0)

## 2023-08-05 LAB — CBC WITH DIFFERENTIAL/PLATELET
Basophils Absolute: 0.1 10*3/uL (ref 0.0–0.1)
Basophils Relative: 0.8 % (ref 0.0–3.0)
Eosinophils Absolute: 0.2 10*3/uL (ref 0.0–0.7)
Eosinophils Relative: 2.9 % (ref 0.0–5.0)
HCT: 44.1 % (ref 36.0–46.0)
Hemoglobin: 14.5 g/dL (ref 12.0–15.0)
Lymphocytes Relative: 28.8 % (ref 12.0–46.0)
Lymphs Abs: 1.9 10*3/uL (ref 0.7–4.0)
MCHC: 33 g/dL (ref 30.0–36.0)
MCV: 97.2 fL (ref 78.0–100.0)
Monocytes Absolute: 0.5 10*3/uL (ref 0.1–1.0)
Monocytes Relative: 7.4 % (ref 3.0–12.0)
Neutro Abs: 3.9 10*3/uL (ref 1.4–7.7)
Neutrophils Relative %: 60.1 % (ref 43.0–77.0)
Platelets: 182 10*3/uL (ref 150.0–400.0)
RBC: 4.53 Mil/uL (ref 3.87–5.11)
RDW: 13.3 % (ref 11.5–15.5)
WBC: 6.5 10*3/uL (ref 4.0–10.5)

## 2023-08-05 LAB — HEMOGLOBIN A1C: Hgb A1c MFr Bld: 5.7 % (ref 4.6–6.5)

## 2023-08-05 LAB — TSH: TSH: 3.12 u[IU]/mL (ref 0.35–5.50)

## 2023-08-12 ENCOUNTER — Ambulatory Visit (INDEPENDENT_AMBULATORY_CARE_PROVIDER_SITE_OTHER): Payer: PPO | Admitting: Family Medicine

## 2023-08-12 ENCOUNTER — Encounter: Payer: Self-pay | Admitting: Family Medicine

## 2023-08-12 VITALS — BP 128/72 | HR 63 | Temp 98.2°F | Ht 64.25 in | Wt 164.0 lb

## 2023-08-12 DIAGNOSIS — Z862 Personal history of diseases of the blood and blood-forming organs and certain disorders involving the immune mechanism: Secondary | ICD-10-CM

## 2023-08-12 DIAGNOSIS — Z0000 Encounter for general adult medical examination without abnormal findings: Secondary | ICD-10-CM | POA: Diagnosis not present

## 2023-08-12 DIAGNOSIS — R7303 Prediabetes: Secondary | ICD-10-CM

## 2023-08-12 DIAGNOSIS — R7309 Other abnormal glucose: Secondary | ICD-10-CM

## 2023-08-12 DIAGNOSIS — E7800 Pure hypercholesterolemia, unspecified: Secondary | ICD-10-CM

## 2023-08-12 DIAGNOSIS — R053 Chronic cough: Secondary | ICD-10-CM

## 2023-08-12 NOTE — Assessment & Plan Note (Signed)
Pt had uri a month ago  Cough is improving slowly  Reassuring exam  Instructed to call if this worsens and consider cxr/further eval

## 2023-08-12 NOTE — Assessment & Plan Note (Addendum)
Reviewed health habits including diet and exercise and skin cancer prevention Reviewed appropriate screening tests for age  Also reviewed health mt list, fam hx and immunization status , as well as social and family history   See HPI Labs reviewed and ordered Declines shingrix vaccine  Mammogram utd 01/2023  Colonoscopy utd 07/2023 Dexa utd 01/2022  Discussed fall prevention, supplements and exercise for bone density  PHQ 0 Health Maintenance  Topic Date Due   Zoster (Shingles) Vaccine (1 of 2) 11/10/2023*   COVID-19 Vaccine (4 - 2024-25 season) 08/27/2024*   Medicare Annual Wellness Visit  05/07/2024   Mammogram  02/03/2025   DTaP/Tdap/Td vaccine (4 - Td or Tdap) 05/14/2026   Colon Cancer Screening  07/29/2030   Pneumonia Vaccine  Completed   Flu Shot  Completed   DEXA scan (bone density measurement)  Completed   Hepatitis C Screening  Completed   HPV Vaccine  Aged Out  *Topic was postponed. The date shown is not the original due date.

## 2023-08-12 NOTE — Progress Notes (Signed)
Subjective:    Patient ID: Alisha Long, female    DOB: 07/11/1951, 72 y.o.   MRN: [id number]  HPI  Here for health maintenance exam and to review chronic medical problems   Wt Readings from Last 3 Encounters:  08/12/23 164 lb (74.4 kg)  07/30/23 160 lb (72.6 kg)  07/09/23 160 lb (72.6 kg)   27.93 kg/m  Vitals:   08/12/23 0851  BP: 128/72  Pulse: 63  Temp: 98.2 F (36.8 C)  SpO2: 94%    Immunization History  Administered Date(s) Administered   Fluad Quad(high Dose 65+) 07/06/2019, 07/11/2020   Influenza, High Dose Seasonal PF 08/01/2021, 05/12/2023   Influenza,inj,Quad PF,6+ Mos 06/30/2014, 05/13/2015, 05/14/2016, 05/21/2017, 05/23/2018   Influenza-Unspecified 08/01/2021, 07/21/2022   PFIZER(Purple Top)SARS-COV-2 Vaccination 10/01/2019, 10/22/2019, 06/01/2020   Pneumococcal Conjugate-13 05/21/2017   Pneumococcal Polysaccharide-23 05/23/2018   Td 04/27/2003, 09/09/2013   Tdap 05/14/2016   Zoster, Live 05/06/2012    There are no preventive care reminders to display for this patient.  Feeling ok over  Getting over a cold  Mucinex/ robitussin for a month  Zyrtec and delsym  No shortness of breath  No fever   Shingrix - not interested   Flu shot -had   Mammogram 01/2023 Self breast exam- no lumps   Gyn health Last pap normal in 2020  Hysterectomy  for bleeding in past  Uses vaginal moisturizer for dryness    Colon cancer screening  07/2023 with 7 y recall if applicable   Bone health  Dexa  01/2022- bmd in normal range  Falls-none  Fractures-none  Supplements -vitamin D and mvi    Exercise  Doing some at home  30 minutes - stretch / cardio and light weights    Sees dermatology yearly    Mood    05/08/2023   12:55 PM 08/13/2022    2:09 PM 08/09/2022    9:33 AM 08/02/2021    8:19 AM 07/29/2020    8:17 AM  Depression screen PHQ 2/9  Decreased Interest 0 0 0 0 0  Down, Depressed, Hopeless 0 0 0 0 0  PHQ - 2 Score 0 0 0 0 0  Altered  sleeping 0 0 0  0  Tired, decreased energy 0 0 0  0  Change in appetite 0 0 0  0  Feeling bad or failure about yourself  0 0 0  0  Trouble concentrating 0 0 0  0  Moving slowly or fidgety/restless 0 0 0  0  Suicidal thoughts 0 0 0  0  PHQ-9 Score 0 0 0  0  Difficult doing work/chores Not difficult at all    Not difficult at all    History of elevated glucose  Lab Results  Component Value Date   HGBA1C 5.7 08/05/2023  Improved  Not a big sweet eater and husband is diabetic      Hyperlipidemia Lab Results  Component Value Date   CHOL 227 (H) 08/05/2023   CHOL 205 (H) 08/02/2022   CHOL 227 (H) 08/07/2021   Lab Results  Component Value Date   HDL 50.20 08/05/2023   HDL 57.20 08/02/2022   HDL 53.60 08/07/2021   Lab Results  Component Value Date   LDLCALC 131 (H) 08/05/2023   LDLCALC 123 (H) 08/02/2022   LDLCALC 139 (H) 08/07/2021   Lab Results  Component Value Date   TRIG 232.0 (H) 08/05/2023   TRIG 127.0 08/02/2022   TRIG 171.0 (H) 08/07/2021   Lab Results  Component Value Date   CHOLHDL 5 08/05/2023   CHOLHDL 4 08/02/2022   CHOLHDL 4 08/07/2021   Lab Results  Component Value Date   LDLDIRECT 154.0 05/25/2019   LDLDIRECT 144.0 05/13/2017   LDLDIRECT 135.0 05/14/2016    Not a lot of red meat She did eat some liver prior to get strength back  More chicken and fish   Intol of statins  No fam history of vascular dz  Mother takes cholesterol medicine (zetia)     Lab Results  Component Value Date   WBC 6.5 08/05/2023   HGB 14.5 08/05/2023   HCT 44.1 08/05/2023   MCV 97.2 08/05/2023   PLT 182.0 08/05/2023   Lab Results  Component Value Date   NA 143 08/05/2023   K 4.3 08/05/2023   CO2 29 08/05/2023   GLUCOSE 96 08/05/2023   BUN 11 08/05/2023   CREATININE 0.72 08/05/2023   CALCIUM 10.2 08/05/2023   GFR 84.17 08/05/2023   GFRNONAA 79.52 01/02/2010   Lab Results  Component Value Date   ALT 13 08/05/2023   AST 16 08/05/2023   ALKPHOS 123  (H) 08/05/2023   BILITOT 0.6 08/05/2023   Lab Results  Component Value Date   TSH 3.12 08/05/2023     Patient Active Problem List   Diagnosis Date Noted   Sebaceous cyst of ear 02/22/2022   Chronic cough 08/07/2021   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   Bilateral primary osteoarthritis of knee 07/26/2020   Routine general medical examination at a health care facility 07/07/2020   Prediabetes 07/06/2019   Lichen planus 63/87/5643   Oral lichen planus 03/03/2018   Seborrheic keratosis 12/14/2016   Estrogen deficiency 05/24/2015   Encounter for routine gynecological examination 04/15/2012   Headache 11/01/2010   COLONIC POLYPS 01/06/2010   IRON DEFICIENCY ANEMIA, HX OF 11/07/2009   OTHER SEBORRHEIC KERATOSIS 07/19/2009   SYMPTOM, INSOMNIA NOS 06/18/2007   Hyperlipidemia 06/11/2007   Past Medical History:  Diagnosis Date   Colon polyps    History of shingles    Hyperlipidemia    Hypertension    Insomnia    Palpitations    Post-nasal drip    with chronic cough   Past Surgical History:  Procedure Laterality Date   ABDOMINAL HYSTERECTOMY     bleeding, ovaries intact   BREAST EXCISIONAL BIOPSY Left    CARPAL TUNNEL RELEASE  11/2004   CESAREAN SECTION     x 3   CHOLECYSTECTOMY  12/2005   COLONOSCOPY     polyps   ESOPHAGOGASTRODUODENOSCOPY     nml   gallstones     Abd [place]    KNEE SURGERY     10/2000   KNEE SURGERY  01/2001   replaced ACL   Social History   Tobacco Use   Smoking status: Former    Current packs/day: 0.00    Average packs/day: 0.2 packs/day for 2.0 years (0.4 ttl pk-yrs)    Types: Cigarettes    Start date: 5    Quit date: 1974    Years since quitting: 50.9   Smokeless tobacco: Never  Vaping Use   Vaping status: Never Used  Substance Use Topics   Alcohol use: Yes    Comment: 1 glass of wine monthly or less   Drug use: No   Family History  Problem Relation Age of Onset   Arthritis Mother    Hyperlipidemia Mother    Seizures Father     Aneurysm Maternal Grandmother    Hyperlipidemia  Maternal Grandmother    Hypertension Maternal Grandmother    Colon cancer Maternal Grandfather    Breast cancer Neg Hx    Colon polyps Neg Hx    Esophageal cancer Neg Hx    Stomach cancer Neg Hx    Ulcerative colitis Neg Hx    Allergies  Allergen Reactions   Penicillins Shortness Of Breath    REACTION: hives   Albuterol     Elevated heart rate   Amoxicillin     REACTION: hives   Atorvastatin     REACTION: joint pain   Bactrim [Sulfamethoxazole-Trimethoprim] Hives   Cephalexin     REACTION: hives   Gabapentin Other (See Comments)    Gave pt thrush    Mometasone Furoate     REACTION: headache   Pseudoephedrine     Elevated heart rate   Rosuvastatin     REACTION: headache   Current Outpatient Medications on File Prior to Visit  Medication Sig Dispense Refill   acyclovir (ZOVIRAX) 400 MG tablet Take 1 tablet (400 mg total) by mouth 3 (three) times daily. For 5 days for cold sore 45 tablet 3   clindamycin (CLEOCIN T) 1 % lotion Apply topically every morning.     cyclobenzaprine (FLEXERIL) 10 MG tablet Take 1 tablet (10 mg total) by mouth 3 (three) times daily as needed for muscle spasms. Caution of sedation 30 tablet 0   Multiple Vitamins-Minerals (MULTIVITAMIN ADULTS 50+ PO) Take 1 capsule by mouth daily.     No current facility-administered medications on file prior to visit.    Review of Systems  Constitutional:  Negative for activity change, appetite change, fatigue, fever and unexpected weight change.  HENT:  Negative for congestion, ear pain, rhinorrhea, sinus pressure and sore throat.        Getting over a cold   Eyes:  Negative for pain, redness and visual disturbance.  Respiratory:  Positive for cough. Negative for choking, shortness of breath and wheezing.   Cardiovascular:  Negative for chest pain and palpitations.  Gastrointestinal:  Negative for abdominal pain, blood in stool, constipation and diarrhea.   Endocrine: Negative for polydipsia and polyuria.  Genitourinary:  Negative for dysuria, frequency and urgency.  Musculoskeletal:  Negative for arthralgias, back pain and myalgias.  Skin:  Negative for pallor and rash.  Allergic/Immunologic: Negative for environmental allergies.  Neurological:  Negative for dizziness, syncope and headaches.  Hematological:  Negative for adenopathy. Does not bruise/bleed easily.  Psychiatric/Behavioral:  Negative for decreased concentration and dysphoric mood. The patient is not nervous/anxious.        Objective:   Physical Exam Constitutional:      General: She is not in acute distress.    Appearance: Normal appearance. She is well-developed and normal weight. She is not ill-appearing or diaphoretic.  HENT:     Head: Normocephalic and atraumatic.     Right Ear: Tympanic membrane, ear canal and external ear normal.     Left Ear: Tympanic membrane, ear canal and external ear normal.     Nose: Nose normal. No congestion.     Mouth/Throat:     Mouth: Mucous membranes are moist.     Pharynx: Oropharynx is clear. No posterior oropharyngeal erythema.  Eyes:     General: No scleral icterus.    Extraocular Movements: Extraocular movements intact.     Conjunctiva/sclera: Conjunctivae normal.     Pupils: Pupils are equal, round, and reactive to light.  Neck:     Thyroid: No thyromegaly.  Vascular: No carotid bruit or JVD.  Cardiovascular:     Rate and Rhythm: Normal rate and regular rhythm.     Pulses: Normal pulses.     Heart sounds: Normal heart sounds.     No gallop.  Pulmonary:     Effort: Pulmonary effort is normal. No respiratory distress.     Breath sounds: Normal breath sounds. No wheezing.     Comments: Good air exch Chest:     Chest wall: No tenderness.  Abdominal:     General: Bowel sounds are normal. There is no distension or abdominal bruit.     Palpations: Abdomen is soft. There is no mass.     Tenderness: There is no abdominal  tenderness.     Hernia: No hernia is present.  Genitourinary:    Comments: Breast exam: No mass, nodules, thickening, tenderness, bulging, retraction, inflamation, nipple discharge or skin changes noted.  No axillary or clavicular LA.     Musculoskeletal:        General: No tenderness. Normal range of motion.     Cervical back: Normal range of motion and neck supple. No rigidity. No muscular tenderness.     Right lower leg: No edema.     Left lower leg: No edema.     Comments: No kyphosis   Lymphadenopathy:     Cervical: No cervical adenopathy.  Skin:    General: Skin is warm and dry.     Coloration: Skin is not pale.     Findings: No erythema or rash.     Comments: Solar lentigines diffusely Some scattered sks   Neurological:     Mental Status: She is alert. Mental status is at baseline.     Cranial Nerves: No cranial nerve deficit.     Motor: No abnormal muscle tone.     Coordination: Coordination normal.     Gait: Gait normal.     Deep Tendon Reflexes: Reflexes are normal and symmetric. Reflexes normal.  Psychiatric:        Mood and Affect: Mood normal.        Cognition and Memory: Cognition and memory normal.           Assessment & Plan:   Problem List Items Addressed This Visit       Other   Routine general medical examination at a health care facility - Primary   Reviewed health habits including diet and exercise and skin cancer prevention Reviewed appropriate screening tests for age  Also reviewed health mt list, fam hx and immunization status , as well as social and family history   See HPI Labs reviewed and ordered Declines shingrix vaccine  Mammogram utd 01/2023  Colonoscopy utd 07/2023 Dexa utd 01/2022  Discussed fall prevention, supplements and exercise for bone density  PHQ 0 Health Maintenance  Topic Date Due   Zoster (Shingles) Vaccine (1 of 2) 11/10/2023*   COVID-19 Vaccine (4 - 2024-25 season) 08/27/2024*   Medicare Annual Wellness Visit   05/07/2024   Mammogram  02/03/2025   DTaP/Tdap/Td vaccine (4 - Td or Tdap) 05/14/2026   Colon Cancer Screening  07/29/2030   Pneumonia Vaccine  Completed   Flu Shot  Completed   DEXA scan (bone density measurement)  Completed   Hepatitis C Screening  Completed   HPV Vaccine  Aged Out  *Topic was postponed. The date shown is not the original due date.          Prediabetes   Lab Results  Component  Value Date   HGBA1C 5.7 08/05/2023   disc imp of low glycemic diet and wt loss to prevent DM2       Hyperlipidemia   Disc goals for lipids and reasons to control them Rev last labs with pt Rev low sat fat diet in detail LDL is 131 Trig 232  Discussed opt of cardiac ca score-handout given/ will call if interested Intol of statins       Chronic cough   Pt had uri a month ago  Cough is improving slowly  Reassuring exam  Instructed to call if this worsens and consider cxr/further eval

## 2023-08-12 NOTE — Assessment & Plan Note (Signed)
Lab Results  Component Value Date   HGBA1C 5.7 08/05/2023   disc imp of low glycemic diet and wt loss to prevent DM2

## 2023-08-12 NOTE — Assessment & Plan Note (Signed)
Disc goals for lipids and reasons to control them Rev last labs with pt Rev low sat fat diet in detail LDL is 131 Trig 232  Discussed opt of cardiac ca score-handout given/ will call if interested Intol of statins

## 2023-08-12 NOTE — Patient Instructions (Addendum)
  Add some strength training to your routine, this is important for bone and brain health and can reduce your risk of falls and help your body use insulin properly and regulate weight  Light weights, exercise bands , and internet videos are a good way to start  Yoga (chair or regular), machines , floor exercises or a gym with machines are also good options   Let us know if cough worsens or fails to improve   For cholesterol Avoid red meat/ fried foods/ egg yolks/ fatty breakfast meats/ butter, cheese and high fat dairy/ and shellfish    To prevent diabetes Try to get most of your carbohydrates from produce (with the exception of white potatoes) and whole grains Eat less bread/pasta/rice/snack foods/cereals/sweets and other items from the middle of the grocery store (processed carbs)  Read about the cardiac calcium score test  It cost approx 200$ out of pocket  If you are interested, let us know   If this noted atherosclerosis - we would discuss cholesterol medicine   Take care of yourself

## 2023-10-03 DIAGNOSIS — L821 Other seborrheic keratosis: Secondary | ICD-10-CM | POA: Diagnosis not present

## 2023-10-03 DIAGNOSIS — L814 Other melanin hyperpigmentation: Secondary | ICD-10-CM | POA: Diagnosis not present

## 2023-10-03 DIAGNOSIS — Z85828 Personal history of other malignant neoplasm of skin: Secondary | ICD-10-CM | POA: Diagnosis not present

## 2023-10-03 DIAGNOSIS — L820 Inflamed seborrheic keratosis: Secondary | ICD-10-CM | POA: Diagnosis not present

## 2023-10-14 ENCOUNTER — Encounter: Payer: Self-pay | Admitting: Family Medicine

## 2023-10-23 ENCOUNTER — Encounter: Payer: Self-pay | Admitting: Family Medicine

## 2023-10-24 ENCOUNTER — Encounter: Payer: Self-pay | Admitting: Internal Medicine

## 2023-10-24 ENCOUNTER — Ambulatory Visit (INDEPENDENT_AMBULATORY_CARE_PROVIDER_SITE_OTHER): Payer: PPO | Admitting: Internal Medicine

## 2023-10-24 VITALS — BP 130/80 | HR 79 | Temp 98.3°F | Ht 64.25 in | Wt 162.0 lb

## 2023-10-24 DIAGNOSIS — R053 Chronic cough: Secondary | ICD-10-CM | POA: Diagnosis not present

## 2023-10-24 MED ORDER — HYDROCODONE BIT-HOMATROP MBR 5-1.5 MG/5ML PO SOLN
5.0000 mL | Freq: Every evening | ORAL | 0 refills | Status: AC | PRN
Start: 2023-10-24 — End: ?

## 2023-10-24 MED ORDER — DOXYCYCLINE HYCLATE 100 MG PO TABS: 100.0000 mg | ORAL_TABLET | Freq: Two times a day (BID) | ORAL | 0 refills | Status: DC

## 2023-10-24 NOTE — Telephone Encounter (Signed)
 Spoke to pt, scheduled pt with Dr. Alphonsus Sias for today, 2/27 @ 9:30am

## 2023-10-24 NOTE — Assessment & Plan Note (Signed)
 Into 3rd week of illness that started like a cold Will treat empirically with doxy 100 bid x 7 days for possible secondary sinus infection Hycodan plus OTC remedies for the severe cough

## 2023-10-24 NOTE — Progress Notes (Signed)
 Subjective:    Patient ID: Alisha Long, female    DOB: 1951-03-17, 73 y.o.   MRN: [id number]  HPI Here due to respiratory infection  This is her 3rd week of illness Started as cold--with head and chest congestion Used mucinex DM--did help some Night cough med and kept head up (and cool mist humidifier) Cough is ongoing though No fever, chills, shakes Some mild aching No headache, sore throat, ear pain No SOB  COVID negative twice  Current Outpatient Medications on File Prior to Visit  Medication Sig Dispense Refill   acyclovir (ZOVIRAX) 400 MG tablet Take 1 tablet (400 mg total) by mouth 3 (three) times daily. For 5 days for cold sore 45 tablet 3   clindamycin (CLEOCIN T) 1 % lotion Apply topically every morning.     cyclobenzaprine (FLEXERIL) 10 MG tablet Take 1 tablet (10 mg total) by mouth 3 (three) times daily as needed for muscle spasms. Caution of sedation 30 tablet 0   Multiple Vitamins-Minerals (MULTIVITAMIN ADULTS 50+ PO) Take 1 capsule by mouth daily.     No current facility-administered medications on file prior to visit.    Allergies  Allergen Reactions   Penicillins Shortness Of Breath    REACTION: hives   Albuterol     Elevated heart rate   Amoxicillin     REACTION: hives   Atorvastatin     REACTION: joint pain   Bactrim [Sulfamethoxazole-Trimethoprim] Hives   Cephalexin     REACTION: hives   Gabapentin Other (See Comments)    Gave pt thrush    Mometasone Furoate     REACTION: headache   Pseudoephedrine     Elevated heart rate   Rosuvastatin     REACTION: headache    Past Medical History:  Diagnosis Date   Colon polyps    History of shingles    Hyperlipidemia    Hypertension    Insomnia    Palpitations    Post-nasal drip    with chronic cough    Past Surgical History:  Procedure Laterality Date   ABDOMINAL HYSTERECTOMY     bleeding, ovaries intact   BREAST EXCISIONAL BIOPSY Left    CARPAL TUNNEL RELEASE  11/2004   CESAREAN  SECTION     x 3   CHOLECYSTECTOMY  12/2005   COLONOSCOPY     polyps   ESOPHAGOGASTRODUODENOSCOPY     nml   gallstones     Abd [place]    KNEE SURGERY     10/2000   KNEE SURGERY  01/2001   replaced ACL    Family History  Problem Relation Age of Onset   Arthritis Mother    Hyperlipidemia Mother    Seizures Father    Aneurysm Maternal Grandmother    Hyperlipidemia Maternal Grandmother    Hypertension Maternal Grandmother    Colon cancer Maternal Grandfather    Breast cancer Neg Hx    Colon polyps Neg Hx    Esophageal cancer Neg Hx    Stomach cancer Neg Hx    Ulcerative colitis Neg Hx     Social History   Socioeconomic History   Marital status: Married    Spouse name: Ree Kida   Number of children: 3   Years of education: Not on file   Highest education level: Not on file  Occupational History   Occupation: Housewife    Employer: [employer] Event organiser: UNEMPLOYED   Occupation: retired  Tobacco Use   Smoking status: Former  Current packs/day: 0.00    Average packs/day: 0.2 packs/day for 2.0 years (0.4 ttl pk-yrs)    Types: Cigarettes    Start date: 75    Quit date: 15    Years since quitting: 51.1   Smokeless tobacco: Never  Vaping Use   Vaping status: Never Used  Substance and Sexual Activity   Alcohol use: Yes    Comment: 1 glass of wine monthly or less   Drug use: No   Sexual activity: Not on file  Other Topics Concern   Not on file  Social History Narrative   Married, 3 children. 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  gets regular exercise.    Social Drivers of Corporate investment banker Strain: Low Risk  (05/04/2023)   Overall Financial Resource Strain (CARDIA)    Difficulty of Paying Living Expenses: Not hard at all  Food Insecurity: No Food Insecurity (05/04/2023)   Hunger Vital Sign    Worried About Running Out of Food in the Last Year: Never true    Ran Out of Food in the Last Year: Never true   Transportation Needs: No Transportation Needs (05/04/2023)   PRAPARE - Administrator, Civil Service (Medical): No    Lack of Transportation (Non-Medical): No  Physical Activity: Sufficiently Active (05/04/2023)   Exercise Vital Sign    Days of Exercise per Week: 3 days    Minutes of Exercise per Session: 60 min  Stress: No Stress Concern Present (05/04/2023)   [employer] of Occupational Health - Occupational Stress Questionnaire    Feeling of Stress : Not at all  Social Connections: Socially Integrated (05/04/2023)   Social Connection and Isolation Panel [NHANES]    Frequency of Communication with Friends and Family: More than three times a week    Frequency of Social Gatherings with Friends and Family: More than three times a week    Attends Religious Services: More than 4 times per year    Active Member of [employer] or Organizations: Yes    Attends Engineer, structural: More than 4 times per year    Marital Status: Married  Catering manager Violence: Not At Risk (05/08/2023)   Humiliation, Afraid, Rape, and Kick questionnaire    Fear of Current or Ex-Partner: No    Emotionally Abused: No    Physically Abused: No    Sexually Abused: No   Review of Systems Did lose smell and taste Some vomiting with bad cough---no nausea Appetite off but able to eat    Objective:   Physical Exam Constitutional:      Appearance: Normal appearance.  HENT:     Head:     Comments: No sinus tenderness    Right Ear: Tympanic membrane and ear canal normal.     Left Ear: Tympanic membrane and ear canal normal.     Mouth/Throat:     Pharynx: No oropharyngeal exudate or posterior oropharyngeal erythema.  Pulmonary:     Effort: Pulmonary effort is normal.     Breath sounds: Normal breath sounds. No wheezing or rales.  Musculoskeletal:     Cervical back: Neck supple.  Lymphadenopathy:     Cervical: No cervical adenopathy.  Neurological:     Mental Status: She is alert.             Assessment & Plan:

## 2023-10-24 NOTE — Telephone Encounter (Signed)
 Pt needs an appt with an available provider

## 2023-10-28 ENCOUNTER — Encounter: Payer: Self-pay | Admitting: Internal Medicine

## 2023-10-29 MED ORDER — CLOTRIMAZOLE 10 MG MT TROC: 10.0000 mg | Freq: Every day | OROMUCOSAL | 0 refills | Status: DC

## 2023-12-05 DIAGNOSIS — H43813 Vitreous degeneration, bilateral: Secondary | ICD-10-CM | POA: Diagnosis not present

## 2023-12-05 DIAGNOSIS — H26493 Other secondary cataract, bilateral: Secondary | ICD-10-CM | POA: Diagnosis not present

## 2023-12-05 DIAGNOSIS — H04123 Dry eye syndrome of bilateral lacrimal glands: Secondary | ICD-10-CM | POA: Diagnosis not present

## 2024-01-21 ENCOUNTER — Other Ambulatory Visit: Payer: Self-pay | Admitting: Family Medicine

## 2024-01-21 DIAGNOSIS — Z1231 Encounter for screening mammogram for malignant neoplasm of breast: Secondary | ICD-10-CM

## 2024-02-06 ENCOUNTER — Ambulatory Visit
Admission: RE | Admit: 2024-02-06 | Discharge: 2024-02-06 | Disposition: A | Discharge status: Home / Self Care | Source: Ambulatory Visit | Attending: Family Medicine | Admitting: Family Medicine

## 2024-02-06 DIAGNOSIS — Z1231 Encounter for screening mammogram for malignant neoplasm of breast: Secondary | ICD-10-CM

## 2024-02-10 ENCOUNTER — Ambulatory Visit: Payer: Self-pay | Admitting: Family Medicine

## 2024-05-11 ENCOUNTER — Encounter: Payer: Self-pay | Admitting: Family Medicine

## 2024-05-12 DIAGNOSIS — R350 Frequency of micturition: Secondary | ICD-10-CM | POA: Diagnosis not present

## 2024-05-12 DIAGNOSIS — R300 Dysuria: Secondary | ICD-10-CM | POA: Diagnosis not present

## 2024-05-12 DIAGNOSIS — N390 Urinary tract infection, site not specified: Secondary | ICD-10-CM | POA: Diagnosis not present

## 2024-05-12 NOTE — Telephone Encounter (Signed)
 Called pt and there was no slots for today and pt didn't want to wait until tomorrow I offer to check another location and pt stated she will go to urgent care

## 2024-05-12 NOTE — Telephone Encounter (Signed)
 Will not send in any abx with out an appt 1st. Please call pt and schedule an appt with any available provider

## 2024-05-25 ENCOUNTER — Encounter: Payer: Self-pay | Admitting: Family Medicine

## 2024-05-26 ENCOUNTER — Ambulatory Visit: Admitting: Family Medicine

## 2024-06-15 ENCOUNTER — Ambulatory Visit (INDEPENDENT_AMBULATORY_CARE_PROVIDER_SITE_OTHER)

## 2024-06-15 VITALS — BP 130/80 | Ht 64.5 in | Wt 165.0 lb

## 2024-06-15 DIAGNOSIS — R300 Dysuria: Secondary | ICD-10-CM | POA: Diagnosis not present

## 2024-06-15 DIAGNOSIS — Z0000 Encounter for general adult medical examination without abnormal findings: Secondary | ICD-10-CM

## 2024-06-15 DIAGNOSIS — R8271 Bacteriuria: Secondary | ICD-10-CM | POA: Diagnosis not present

## 2024-06-15 DIAGNOSIS — N390 Urinary tract infection, site not specified: Secondary | ICD-10-CM | POA: Diagnosis not present

## 2024-06-15 NOTE — Progress Notes (Signed)
 Because this visit was a virtual/telehealth visit,  certain criteria was not obtained, such a blood pressure, CBG if applicable, and timed get up and go. Any medications not marked as taking were not mentioned during the medication reconciliation part of the visit. Any vitals not documented were not able to be obtained due to this being a telehealth visit or patient was unable to self-report a recent blood pressure reading due to a lack of equipment at home via telehealth. Vitals that have been documented are verbally provided by the patient.  This visit was performed by a medical professional under my direct supervision. I was immediately available for consultation/collaboration. I have reviewed and agree with the Annual Wellness Visit documentation.  Subjective:   Alisha Long is a 73 y.o. who presents for a Medicare Wellness preventive visit.  As a reminder, Annual Wellness Visits don't include a physical exam, and some assessments may be limited, especially if this visit is performed virtually. We may recommend an in-person follow-up visit with your provider if needed.  Visit Complete: Virtual I connected with  Alisha Long on 06/15/24 by a audio enabled telemedicine application and verified that I am speaking with the correct person using two identifiers.  Patient Location: Home  Provider Location: Home Office  I discussed the limitations of evaluation and management by telemedicine. The patient expressed understanding and agreed to proceed.  Vital Signs: Because this visit was a virtual/telehealth visit, some criteria may be missing or patient reported. Any vitals not documented were not able to be obtained and vitals that have been documented are patient reported.  VideoDeclined- This patient declined Librarian, academic. Therefore the visit was completed with audio only.  Persons Participating in Visit: Patient.  AWV Questionnaire: Yes: Patient  Medicare AWV questionnaire was completed by the patient on 06/11/2024; I have confirmed that all information answered by patient is correct and no changes since this date.  Cardiac Risk Factors include: advanced age (>73men, >50 women);dyslipidemia     Objective:    Today's Vitals   06/15/24 1444  BP: 130/80  Weight: 165 lb (74.8 kg)  Height: 5' 4.5 (1.638 m)   Body mass index is 27.88 kg/m.     06/15/2024    2:43 PM 05/08/2023   12:57 PM 08/13/2022    2:10 PM 08/02/2021    8:16 AM 07/29/2020    8:15 AM 05/23/2018   11:34 AM 07/31/2016    7:30 AM  Advanced Directives  Does Patient Have a Medical Advance Directive? Yes Yes Yes Yes Yes Yes  Yes   Type of Estate agent of [place];Living will Healthcare Power of [place];Living will Healthcare Power of [place];Living will Healthcare Power of [place];Living will Healthcare Power of [place];Living will Living will   Does patient want to make changes to medical advance directive? No - Patient declined No - Patient declined No - Patient declined Yes (ED - Information included in AVS)     Copy of Healthcare Power of Attorney in Chart? No - copy requested No - copy requested No - copy requested  No - copy requested    Would patient like information on creating a medical advance directive?      No - Patient declined       Data saved with a previous flowsheet row definition    Current Medications (verified) Outpatient Encounter Medications as of 06/15/2024  Medication Sig   acyclovir  (ZOVIRAX ) 400 MG tablet Take 1 tablet (400 mg total) by  mouth 3 (three) times daily. For 5 days for cold sore   clindamycin (CLEOCIN T) 1 % lotion Apply topically every morning.   clotrimazole  (MYCELEX ) 10 MG troche Take 1 tablet (10 mg total) by mouth 5 (five) times daily.   cyclobenzaprine  (FLEXERIL ) 10 MG tablet Take 1 tablet (10 mg total) by mouth 3 (three) times daily as needed for muscle spasms. Caution of sedation   doxycycline   (VIBRA -TABS) 100 MG tablet Take 1 tablet (100 mg total) by mouth 2 (two) times daily.   HYDROcodone  bit-homatropine (HYCODAN) 5-1.5 MG/5ML syrup Take 5 mLs by mouth at bedtime as needed for cough.   Multiple Vitamins-Minerals (MULTIVITAMIN ADULTS 50+ PO) Take 1 capsule by mouth daily.   No facility-administered encounter medications on file as of 06/15/2024.    Allergies (verified) Penicillins, Albuterol, Amoxicillin, Atorvastatin, Bactrim  [sulfamethoxazole -trimethoprim ], Cephalexin, Gabapentin, Mometasone  furoate, Pseudoephedrine, and Rosuvastatin    History: Past Medical History:  Diagnosis Date   Colon polyps    History of shingles    Hyperlipidemia    Hypertension    Insomnia    Palpitations    Post-nasal drip    with chronic cough   Past Surgical History:  Procedure Laterality Date   ABDOMINAL HYSTERECTOMY     bleeding, ovaries intact   BREAST EXCISIONAL BIOPSY Left    CARPAL TUNNEL RELEASE  11/2004   CESAREAN SECTION     x 3   CHOLECYSTECTOMY  12/2005   COLONOSCOPY     polyps   ESOPHAGOGASTRODUODENOSCOPY     nml   gallstones     Abd [place]     KNEE SURGERY     10/2000   KNEE SURGERY  01/2001   replaced ACL   Family History  Problem Relation Age of Onset   Arthritis Mother    Hyperlipidemia Mother    Seizures Father    Aneurysm Maternal Grandmother    Hyperlipidemia Maternal Grandmother    Hypertension Maternal Grandmother    Colon cancer Maternal Grandfather    Breast cancer Neg Hx    Colon polyps Neg Hx    Esophageal cancer Neg Hx    Stomach cancer Neg Hx    Ulcerative colitis Neg Hx    Social History   Socioeconomic History   Marital status: Married    Spouse name: Marinell   Number of children: 3   Years of education: Not on file   Highest education level: Bachelor's degree (e.g., BA, AB, BS)  Occupational History   Occupation: Engineer, site: Interior and spatial designer: UNEMPLOYED   Occupation: retired  Tobacco Use   Smoking  status: Former    Current packs/day: 0.00    Average packs/day: 0.2 packs/day for 2.0 years (0.4 ttl pk-yrs)    Types: Cigarettes    Start date: 60    Quit date: 1974    Years since quitting: 51.8   Smokeless tobacco: Never  Vaping Use   Vaping status: Never Used  Substance and Sexual Activity   Alcohol use: Yes    Comment: 1 glass of wine monthly or less   Drug use: No   Sexual activity: Not on file  Other Topics Concern   Not on file  Social History Narrative   Married, 3 children. 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  gets regular exercise.    Social Drivers of Corporate investment banker Strain: Low Risk  (06/11/2024)   Overall Financial Resource Strain (CARDIA)  Difficulty of Paying Living Expenses: Not hard at all  Food Insecurity: No Food Insecurity (06/11/2024)   Hunger Vital Sign    Worried About Running Out of Food in the Last Year: Never true    Ran Out of Food in the Last Year: Never true  Transportation Needs: No Transportation Needs (06/11/2024)   PRAPARE - Administrator, Civil Service (Medical): No    Lack of Transportation (Non-Medical): No  Physical Activity: Insufficiently Active (06/11/2024)   Exercise Vital Sign    Days of Exercise per Week: 3 days    Minutes of Exercise per Session: 30 min  Stress: No Stress Concern Present (06/11/2024)   [employer] of Occupational Health - Occupational Stress Questionnaire    Feeling of Stress: Not at all  Social Connections: Socially Integrated (06/11/2024)   Social Connection and Isolation Panel    Frequency of Communication with Friends and Family: More than three times a week    Frequency of Social Gatherings with Friends and Family: More than three times a week    Attends Religious Services: More than 4 times per year    Active Member of [employer] or Organizations: Yes    Attends Engineer, structural: More than 4 times per year    Marital Status: Married     Tobacco Counseling Counseling given: Not Answered    Clinical Intake:  Pre-visit preparation completed: Yes  Pain : No/denies pain     BMI - recorded: 27.88 Nutritional Status: BMI 25 -29 Overweight Nutritional Risks: None Diabetes: No  Lab Results  Component Value Date   HGBA1C 5.7 08/05/2023   HGBA1C 5.9 08/02/2022   HGBA1C 5.7 08/07/2021     How often do you need to have someone help you when you read instructions, pamphlets, or other written materials from your doctor or pharmacy?: 1 - Never  Interpreter Needed?: No  Information entered by :: Taitum Alms,CMA   Activities of Daily Living     06/11/2024    5:18 PM  In your present state of health, do you have any difficulty performing the following activities:  Hearing? 0  Vision? 0  Difficulty concentrating or making decisions? 0  Walking or climbing stairs? 0  Dressing or bathing? 0  Doing errands, shopping? 0  Preparing Food and eating ? N  Using the Toilet? N  In the past six months, have you accidently leaked urine? Y  Do you have problems with loss of bowel control? N  Managing your Medications? N  Managing your Finances? N  Housekeeping or managing your Housekeeping? N    Patient Care Team: Tower, Laine LABOR, MD as PCP - General Dermatology, [facility] (Dermatology) Patrcia Sharper, MD as Consulting Physician (Ophthalmology)  I have updated your Care Teams any recent Medical Services you may have received from other providers in the past year.     Assessment:   This is a routine wellness examination for [place].  Hearing/Vision screen Hearing Screening - Comments:: No difficulties Vision Screening - Comments:: Patient wears otc readers   Goals Addressed             This Visit's Progress    Patient Stated       Lose weight       Depression Screen     06/15/2024    2:48 PM 05/08/2023   12:55 PM 08/13/2022    2:09 PM 08/09/2022    9:33 AM 08/02/2021    8:19 AM  07/29/2020  8:17 AM 07/11/2020    3:32 PM  PHQ 2/9 Scores  PHQ - 2 Score 0 0 0 0 0 0 0  PHQ- 9 Score 0 0 0 0  0     Fall Risk     06/11/2024    5:18 PM 05/04/2023   10:54 PM 02/05/2023   12:08 PM 08/13/2022    2:11 PM 08/12/2022    5:17 PM  Fall Risk   Falls in the past year? 0 0 0 0 0  Number falls in past yr: 0 0 0 0 0  Injury with Fall? 0 0 0 0 0  Risk for fall due to : No Fall Risks No Fall Risks No Fall Risks    Follow up Falls evaluation completed Falls prevention discussed Falls evaluation completed Falls evaluation completed;Falls prevention discussed       Data saved with a previous flowsheet row definition    MEDICARE RISK AT HOME:  Medicare Risk at Home Any stairs in or around the home?: (Patient-Rptd) Yes If so, are there any without handrails?: (Patient-Rptd) No Home free of loose throw rugs in walkways, pet beds, electrical cords, etc?: (Patient-Rptd) Yes Adequate lighting in your home to reduce risk of falls?: (Patient-Rptd) Yes Life alert?: (Patient-Rptd) No Use of a cane, walker or w/c?: (Patient-Rptd) No Grab bars in the bathroom?: (Patient-Rptd) No Shower chair or bench in shower?: (Patient-Rptd) Yes Elevated toilet seat or a handicapped toilet?: (Patient-Rptd) Yes  TIMED UP AND GO:  Was the test performed?  No  Cognitive Function: 6CIT completed    07/29/2020    8:21 AM 05/23/2018   11:34 AM  MMSE - Mini Mental State Exam  Orientation to time 5 5  Orientation to Place 5 5  Registration 3 3  Attention/ Calculation 5 0  Recall 3 3  Language- name 2 objects  0  Language- repeat 1 1  Language- follow 3 step command  3  Language- read & follow direction  0  Write a sentence  0  Copy design  0  Total score  20        06/15/2024    2:48 PM 05/08/2023   12:58 PM 08/13/2022    2:11 PM  6CIT Screen  What Year? 0 points 0 points 0 points  What month? 0 points 0 points 0 points  What time? 0 points 0 points 0 points  Count back from 20 0 points  0 points 0 points  Months in reverse 0 points 0 points 0 points  Repeat phrase 0 points 0 points 0 points  Total Score 0 points 0 points 0 points    Immunizations Immunization History  Administered Date(s) Administered   Fluad Quad(high Dose 65+) 07/06/2019, 07/11/2020   INFLUENZA, HIGH DOSE SEASONAL PF 08/01/2021, 05/12/2023   Influenza,inj,Quad PF,6+ Mos 06/30/2014, 05/13/2015, 05/14/2016, 05/21/2017, 05/23/2018   Influenza-Unspecified 08/01/2021, 07/21/2022   PFIZER(Purple Top)SARS-COV-2 Vaccination 10/01/2019, 10/22/2019, 06/01/2020   Pneumococcal Conjugate-13 05/21/2017   Pneumococcal Polysaccharide-23 05/23/2018   Td 04/27/2003, 09/09/2013   Tdap 05/14/2016   Zoster, Live 05/06/2012    Screening Tests Health Maintenance  Topic Date Due   Zoster Vaccines- Shingrix (1 of 2) 04/04/1971   Influenza Vaccine  03/27/2024   COVID-19 Vaccine (4 - 2025-26 season) 04/27/2024   Medicare Annual Wellness (AWV)  06/15/2025   Mammogram  02/05/2026   DTaP/Tdap/Td (4 - Td or Tdap) 05/14/2026   Colonoscopy  07/29/2030   Pneumococcal Vaccine: 50+ Years  Completed   DEXA SCAN  Completed   Hepatitis C Screening  Completed   Meningococcal B Vaccine  Aged Out    Health Maintenance Items Addressed:   Additional Screening:  Vision Screening: Recommended annual ophthalmology exams for early detection of glaucoma and other disorders of the eye. Is the patient up to date with their annal eye exam?  Yes    Dental Screening: Recommended annual dental exams for proper oral hygiene  Community Resource Referral / Chronic Care Management: CRR required this visit?  No   CCM required this visit?  No   Plan:    I have personally reviewed and noted the following in the patient's chart:   Medical and social history Use of alcohol, tobacco or illicit drugs  Current medications and supplements including opioid prescriptions. Patient is not currently taking opioid prescriptions. Functional  ability and status Nutritional status Physical activity Advanced directives List of other physicians Hospitalizations, surgeries, and ER visits in previous 12 months Vitals Screenings to include cognitive, depression, and falls Referrals and appointments  In addition, I have reviewed and discussed with patient certain preventive protocols, quality metrics, and best practice recommendations. A written personalized care plan for preventive services as well as general preventive health recommendations were provided to patient.   Lyle MARLA Right, [place]   06/15/2024   After Visit Summary: (MyChart) Due to this being a telephonic visit, the after visit summary with patients personalized plan was offered to patient via MyChart   Notes: Nothing significant to report at this time.

## 2024-06-15 NOTE — Patient Instructions (Signed)
 [name],  Thank you for taking the time for your Medicare Wellness Visit. I appreciate your continued commitment to your health goals. Please review the care plan we discussed, and feel free to reach out if I can assist you further.  Medicare recommends these wellness visits once per year to help you and your care team stay ahead of potential health issues. These visits are designed to focus on prevention, allowing your provider to concentrate on managing your acute and chronic conditions during your regular appointments.  Please note that Annual Wellness Visits do not include a physical exam. Some assessments may be limited, especially if the visit was conducted virtually. If needed, we may recommend a separate in-person follow-up with your provider.  Ongoing Care Seeing your primary care provider every 3 to 6 months helps us  monitor your health and provide consistent, personalized care.   Referrals If a referral was made during today's visit and you haven't received any updates within two weeks, please contact the referred provider directly to check on the status.  Recommended Screenings:  Health Maintenance  Topic Date Due   Zoster (Shingles) Vaccine (1 of 2) 04/04/1971   Flu Shot  03/27/2024   COVID-19 Vaccine (4 - 2025-26 season) 04/27/2024   Medicare Annual Wellness Visit  08/11/2024   Breast Cancer Screening  02/05/2026   DTaP/Tdap/Td vaccine (4 - Td or Tdap) 05/14/2026   Colon Cancer Screening  07/29/2030   Pneumococcal Vaccine for age over 33  Completed   DEXA scan (bone density measurement)  Completed   Hepatitis C Screening  Completed   Meningitis B Vaccine  Aged Out       06/15/2024    2:43 PM  Advanced Directives  Does Patient Have a Medical Advance Directive? Yes  Type of Estate agent of [place];Living will  Does patient want to make changes to medical advance directive? No - Patient declined  Copy of Healthcare Power of Attorney in  Chart? No - copy requested   Advance Care Planning is important because it: Ensures you receive medical care that aligns with your values, goals, and preferences. Provides guidance to your family and loved ones, reducing the emotional burden of decision-making during critical moments.  Vision: Annual vision screenings are recommended for early detection of glaucoma, cataracts, and diabetic retinopathy. These exams can also reveal signs of chronic conditions such as diabetes and high blood pressure.  Dental: Annual dental screenings help detect early signs of oral cancer, gum disease, and other conditions linked to overall health, including heart disease and diabetes.  Please see the attached documents for additional preventive care recommendations.

## 2024-06-16 ENCOUNTER — Telehealth: Payer: Self-pay | Admitting: Family Medicine

## 2024-06-16 NOTE — Telephone Encounter (Signed)
 Called pt to schedule pt appt and pt stated that she went to urgent care and got treated for the uti I told pt to give us  a call if meds doesn't work so we could get her a appt with her provider

## 2024-06-17 ENCOUNTER — Ambulatory Visit

## 2024-08-02 ENCOUNTER — Telehealth: Payer: Self-pay | Admitting: Family Medicine

## 2024-08-02 DIAGNOSIS — E7800 Pure hypercholesterolemia, unspecified: Secondary | ICD-10-CM

## 2024-08-02 DIAGNOSIS — Z862 Personal history of diseases of the blood and blood-forming organs and certain disorders involving the immune mechanism: Secondary | ICD-10-CM

## 2024-08-02 DIAGNOSIS — R7303 Prediabetes: Secondary | ICD-10-CM

## 2024-08-02 NOTE — Telephone Encounter (Signed)
-----   Message from Harlene Du sent at 07/22/2024 10:59 AM EST ----- Regarding: Labs Wed 08/05/24 Hello,  Patient is coming in for CPE labs. Can we get orders please.   Thanks

## 2024-08-05 ENCOUNTER — Ambulatory Visit: Payer: Self-pay | Admitting: Family Medicine

## 2024-08-05 ENCOUNTER — Other Ambulatory Visit (INDEPENDENT_AMBULATORY_CARE_PROVIDER_SITE_OTHER)

## 2024-08-05 DIAGNOSIS — R7303 Prediabetes: Secondary | ICD-10-CM | POA: Diagnosis not present

## 2024-08-05 DIAGNOSIS — E7800 Pure hypercholesterolemia, unspecified: Secondary | ICD-10-CM | POA: Diagnosis not present

## 2024-08-05 DIAGNOSIS — Z862 Personal history of diseases of the blood and blood-forming organs and certain disorders involving the immune mechanism: Secondary | ICD-10-CM | POA: Diagnosis not present

## 2024-08-05 LAB — LIPID PANEL
Cholesterol: 213 mg/dL — ABNORMAL HIGH (ref 0–200)
HDL: 56.7 mg/dL (ref >39.00)
LDL Cholesterol: 121 mg/dL — ABNORMAL HIGH (ref 0–99)
NonHDL: 156.75
Total CHOL/HDL Ratio: 4
Triglycerides: 178 mg/dL — ABNORMAL HIGH (ref 0.0–149.0)
VLDL: 35.6 mg/dL (ref 0.0–40.0)

## 2024-08-05 LAB — CBC WITH DIFFERENTIAL/PLATELET
Basophils Absolute: 0.1 K/uL (ref 0.0–0.1)
Basophils Relative: 2.1 % (ref 0.0–3.0)
Eosinophils Absolute: 0.1 K/uL (ref 0.0–0.7)
Eosinophils Relative: 1.7 % (ref 0.0–5.0)
HCT: 42.8 % (ref 36.0–46.0)
Hemoglobin: 14.4 g/dL (ref 12.0–15.0)
Lymphocytes Relative: 27.8 % (ref 12.0–46.0)
Lymphs Abs: 1.8 K/uL (ref 0.7–4.0)
MCHC: 33.8 g/dL (ref 30.0–36.0)
MCV: 94.5 fl (ref 78.0–100.0)
Monocytes Absolute: 0.5 K/uL (ref 0.1–1.0)
Monocytes Relative: 7.8 % (ref 3.0–12.0)
Neutro Abs: 4 K/uL (ref 1.4–7.7)
Neutrophils Relative %: 60.6 % (ref 43.0–77.0)
Platelets: 168 K/uL (ref 150.0–400.0)
RBC: 4.52 Mil/uL (ref 3.87–5.11)
RDW: 13.4 % (ref 11.5–15.5)
WBC: 6.6 K/uL (ref 4.0–10.5)

## 2024-08-05 LAB — COMPREHENSIVE METABOLIC PANEL WITH GFR
ALT: 15 U/L (ref 0–35)
AST: 20 U/L (ref 0–37)
Albumin: 4.4 g/dL (ref 3.5–5.2)
Alkaline Phosphatase: 109 U/L (ref 39–117)
BUN: 11 mg/dL (ref 6–23)
CO2: 31 meq/L (ref 19–32)
Calcium: 10.4 mg/dL (ref 8.4–10.5)
Chloride: 105 meq/L (ref 96–112)
Creatinine, Ser: 0.61 mg/dL (ref 0.40–1.20)
GFR: 89.37 mL/min (ref >60.00)
Glucose, Bld: 86 mg/dL (ref 70–99)
Potassium: 3.9 meq/L (ref 3.5–5.1)
Sodium: 141 meq/L (ref 135–145)
Total Bilirubin: 0.6 mg/dL (ref 0.2–1.2)
Total Protein: 6.7 g/dL (ref 6.0–8.3)

## 2024-08-05 LAB — TSH: TSH: 3.11 u[IU]/mL (ref 0.35–5.50)

## 2024-08-05 LAB — HEMOGLOBIN A1C: Hgb A1c MFr Bld: 5.7 % (ref 4.6–6.5)

## 2024-08-12 ENCOUNTER — Encounter: Payer: Self-pay | Admitting: Family Medicine

## 2024-08-12 ENCOUNTER — Ambulatory Visit: Admitting: Family Medicine

## 2024-08-12 ENCOUNTER — Ambulatory Visit: Payer: Self-pay | Admitting: Family Medicine

## 2024-08-12 VITALS — BP 139/65 | HR 66 | Temp 98.0°F | Ht 64.25 in | Wt 168.5 lb

## 2024-08-12 DIAGNOSIS — R7303 Prediabetes: Secondary | ICD-10-CM

## 2024-08-12 DIAGNOSIS — Z23 Encounter for immunization: Secondary | ICD-10-CM

## 2024-08-12 DIAGNOSIS — R030 Elevated blood-pressure reading, without diagnosis of hypertension: Secondary | ICD-10-CM | POA: Diagnosis not present

## 2024-08-12 DIAGNOSIS — Z0000 Encounter for general adult medical examination without abnormal findings: Secondary | ICD-10-CM | POA: Diagnosis not present

## 2024-08-12 DIAGNOSIS — E7800 Pure hypercholesterolemia, unspecified: Secondary | ICD-10-CM

## 2024-08-12 DIAGNOSIS — Z862 Personal history of diseases of the blood and blood-forming organs and certain disorders involving the immune mechanism: Secondary | ICD-10-CM

## 2024-08-12 DIAGNOSIS — R220 Localized swelling, mass and lump, head: Secondary | ICD-10-CM

## 2024-08-12 DIAGNOSIS — N3946 Mixed incontinence: Secondary | ICD-10-CM | POA: Diagnosis not present

## 2024-08-12 DIAGNOSIS — R300 Dysuria: Secondary | ICD-10-CM | POA: Diagnosis not present

## 2024-08-12 DIAGNOSIS — R069 Unspecified abnormalities of breathing: Secondary | ICD-10-CM

## 2024-08-12 DIAGNOSIS — B001 Herpesviral vesicular dermatitis: Secondary | ICD-10-CM | POA: Diagnosis not present

## 2024-08-12 DIAGNOSIS — R8290 Unspecified abnormal findings in urine: Secondary | ICD-10-CM

## 2024-08-12 DIAGNOSIS — E2839 Other primary ovarian failure: Secondary | ICD-10-CM

## 2024-08-12 LAB — POC URINALSYSI DIPSTICK (AUTOMATED)
Bilirubin, UA: NEGATIVE
Blood, UA: NEGATIVE
Glucose, UA: NEGATIVE
Ketones, UA: NEGATIVE
Nitrite, UA: NEGATIVE
Protein, UA: NEGATIVE
Spec Grav, UA: 1.015 (ref 1.010–1.025)
Urobilinogen, UA: 0.2 U/dL
pH, UA: 7 (ref 5.0–8.0)

## 2024-08-12 NOTE — Patient Instructions (Addendum)
 Leave a urine sample today   Follow up in the next several weeks to discuss urinary symptoms, breathing and blood pressure     I think you may have a cyst on your scalp  Check in with dermatology and keep us  posted    Lungs sound clear   Flu shot today

## 2024-08-12 NOTE — Progress Notes (Unsigned)
 Subjective:    Patient ID: Alisha Long, female    DOB: 1950/12/07, 73 y.o.   MRN: [id number]  HPI  Here for health maintenance exam and to review chronic medical problems   Wt Readings from Last 3 Encounters:  08/12/24 168 lb 8 oz (76.4 kg)  06/15/24 165 lb (74.8 kg)  10/24/23 162 lb (73.5 kg)   28.70 kg/m  Vitals:   08/12/24 0936 08/12/24 1009  BP: (!) 142/70 139/65  Pulse: 66   Temp: 98 F (36.7 C)   SpO2: 97%     Immunization History  Administered Date(s) Administered   Fluad Quad(high Dose 65+) 07/06/2019, 07/11/2020   INFLUENZA, HIGH DOSE SEASONAL PF 08/01/2021, 05/12/2023, 08/12/2024   Influenza,inj,Quad PF,6+ Mos 06/30/2014, 05/13/2015, 05/14/2016, 05/21/2017, 05/23/2018   Influenza-Unspecified 08/01/2021, 07/21/2022   PFIZER(Purple Top)SARS-COV-2 Vaccination 10/01/2019, 10/22/2019, 06/01/2020   Pneumococcal Conjugate-13 05/21/2017   Pneumococcal Polysaccharide-23 05/23/2018   Td 04/27/2003, 09/09/2013   Tdap 05/14/2016   Zoster, Live 05/06/2012    There are no preventive care reminders to display for this patient.  Doing well overall   Flu shot - today   Mammogram 01/2024 Self breast exam-no lumps  Gyn health Hysterectomy - for bleeding  No history of cervical cancer  Used replens for years  Intercourse is not painful   Started doing kegel exercises   (some stress incontinence)   Had uti in sept , then oct -was treated  She changed her vaginal lubricant  Occational chills when she urinates   Colon cancer screening  Colonoscopy 07/2023  7 y recall   Bone health  Dexa  01/2022 bmd normal range  Falls-none  Fractures-none  Supplements  mvi and vitamin D    Exercise  Not enough  Active with 4 grand children- pass a lot of colds around  [facility] not [facility] to   [facility]   Some hip pain at night Got a new mattress    Mood    08/12/2024    9:59 AM 06/15/2024    2:48 PM  05/08/2023   12:55 PM 08/13/2022    2:09 PM 08/09/2022    9:33 AM  Depression screen PHQ 2/9  Decreased Interest 0 0 0 0 0  Down, Depressed, Hopeless 0 0 0 0 0  PHQ - 2 Score 0 0 0 0 0  Altered sleeping 0 0 0 0 0  Tired, decreased energy 0 0 0 0 0  Change in appetite 0 0 0 0 0  Feeling bad or failure about yourself  0 0 0 0 0  Trouble concentrating 0 0 0 0 0  Moving slowly or fidgety/restless 0 0 0 0 0  Suicidal thoughts 0 0 0 0 0  PHQ-9 Score 0 0  0  0  0   Difficult doing work/chores Not difficult at all Not difficult at all Not difficult at all       Data saved with a previous flowsheet row definition   Stress overall is not bad   Has noticed  Suddenly needs to take a deep breath - body makes her do it  Cough is better (acute on chronic)  No wheezing   Prediabetes Lab Results  Component Value Date   HGBA1C 5.7 08/05/2024   HGBA1C 5.7 08/05/2023   HGBA1C 5.9 08/02/2022   Good balanced diet  Not much junk   Lab Results  Component Value Date  NA 141 08/05/2024   K 3.9 08/05/2024   CO2 31 08/05/2024   GLUCOSE 86 08/05/2024   BUN 11 08/05/2024   CREATININE 0.61 08/05/2024   CALCIUM  10.4 08/05/2024   GFR 89.37 08/05/2024   GFRNONAA 79.52 01/02/2010    Hyperlipidemia Lab Results  Component Value Date   CHOL 213 (H) 08/05/2024   CHOL 227 (H) 08/05/2023   CHOL 205 (H) 08/02/2022   Lab Results  Component Value Date   HDL 56.70 08/05/2024   HDL 50.20 08/05/2023   HDL 57.20 08/02/2022   Lab Results  Component Value Date   LDLCALC 121 (H) 08/05/2024   LDLCALC 131 (H) 08/05/2023   LDLCALC 123 (H) 08/02/2022   Lab Results  Component Value Date   TRIG 178.0 (H) 08/05/2024   TRIG 232.0 (H) 08/05/2023   TRIG 127.0 08/02/2022   Lab Results  Component Value Date   CHOLHDL 4 08/05/2024   CHOLHDL 5 08/05/2023   CHOLHDL 4 08/02/2022   Lab Results  Component Value Date   LDLDIRECT 154.0 05/25/2019   LDLDIRECT 144.0 05/13/2017   LDLDIRECT 135.0  05/14/2016   Is eating well  The 10-year ASCVD risk score (Arnett DK, et al., 2019) is: 13.8%   Values used to calculate the score:     Age: 56 years     Clinically relevant sex: Female     Is Non-Hispanic African American: No     Diabetic: No     Tobacco smoker: No     Systolic Blood Pressure: 139 mmHg     Is BP treated: No     HDL Cholesterol: 56.7 mg/dL     Total Cholesterol: 213 mg/dL    Lab Results  Component Value Date   ALT 15 08/05/2024   AST 20 08/05/2024   ALKPHOS 109 08/05/2024   BILITOT 0.6 08/05/2024    Lab Results  Component Value Date   WBC 6.6 08/05/2024   HGB 14.4 08/05/2024   HCT 42.8 08/05/2024   MCV 94.5 08/05/2024   PLT 168.0 08/05/2024   Lab Results  Component Value Date   TSH 3.11 08/05/2024   Results for orders placed or performed in visit on 08/12/24  POCT Urinalysis Dipstick (Automated)   Collection Time: 08/12/24 11:35 AM  Result Value Ref Range   Color, UA Yellow    Clarity, UA Clear    Glucose, UA Negative Negative   Bilirubin, UA Negative    Ketones, UA Negative    Spec Grav, UA 1.015 1.010 - 1.025   Blood, UA Negative    pH, UA 7.0 5.0 - 8.0   Protein, UA Negative Negative   Urobilinogen, UA 0.2 0.2 or 1.0 E.U./dL   Nitrite, UA Negative    Leukocytes, UA Small (1+) (A) Negative      Patient Active Problem List   Diagnosis Date Noted   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    Priority: Low   Lump of scalp 08/13/2024   Breathing problem 08/13/2024   Mixed incontinence 08/12/2024   Elevated BP without diagnosis of hypertension 08/12/2024   Sebaceous cyst of ear 02/22/2022   Chronic cough 08/07/2021   Bilateral primary osteoarthritis of knee 07/26/2020   Routine general medical examination at a health care facility 07/07/2020   Prediabetes 07/06/2019   Dysuria 04/17/2019   Lichen planus 90/69/7980   Oral lichen planus 03/03/2018   Seborrheic keratosis 12/14/2016   Estrogen deficiency 05/24/2015   Encounter for routine  gynecological examination 04/15/2012   COLONIC POLYPS 01/06/2010  IRON DEFICIENCY ANEMIA, HX OF 11/07/2009   OTHER SEBORRHEIC KERATOSIS 07/19/2009   SYMPTOM, INSOMNIA NOS 06/18/2007   Hyperlipidemia 06/11/2007   Past Medical History:  Diagnosis Date   Allergy SEASONAL   ADULT LIFE   Arthritis 5 YEARS ago   KNEES replaced   Cataract 3YEARS AGO   BOTH REPLACED   Colon polyps    History of shingles    Hyperlipidemia    Hypertension    Insomnia    Palpitations    Post-nasal drip    with chronic cough   Past Surgical History:  Procedure Laterality Date   ABDOMINAL HYSTERECTOMY  1987   bleeding, ovaries intact   BREAST EXCISIONAL BIOPSY Left    CARPAL TUNNEL RELEASE  11/2004   CESAREAN SECTION  [phone]   x 3   CHOLECYSTECTOMY  12/2005   COLONOSCOPY     polyps   ESOPHAGOGASTRODUODENOSCOPY     nml   EYE SURGERY  34YRS   CATARACTS   gallstones     Abd US     JOINT REPLACEMENT  2024   knees   KNEE SURGERY     10/2000   KNEE SURGERY  01/2001   replaced ACL   Social History[1] Family History  Problem Relation Age of Onset   Arthritis Mother    Hyperlipidemia Mother    Seizures Father    Aneurysm Maternal Grandmother    Hyperlipidemia Maternal Grandmother    Hypertension Maternal Grandmother    Colon cancer Maternal Grandfather    Breast cancer Neg Hx    Colon polyps Neg Hx    Esophageal cancer Neg Hx    Stomach cancer Neg Hx    Ulcerative colitis Neg Hx    Allergies[2] Medications Ordered Prior to Encounter[3]  Review of Systems  Constitutional:  Negative for activity change, appetite change, fatigue, fever and unexpected weight change.  HENT:  Negative for congestion, ear pain, rhinorrhea, sinus pressure and sore throat.   Eyes:  Negative for pain, redness and visual disturbance.  Respiratory:  Negative for cough, shortness of breath and wheezing.        Occational urge to take deep breath   Cardiovascular:  Negative for chest pain and palpitations.   Gastrointestinal:  Negative for abdominal pain, blood in stool, constipation and diarrhea.  Endocrine: Negative for polydipsia and polyuria.  Genitourinary:  Positive for dysuria. Negative for frequency and urgency.  Musculoskeletal:  Negative for arthralgias, back pain and myalgias.  Skin:  Negative for pallor and rash.       Lump on scalp  Allergic/Immunologic: Negative for environmental allergies.  Neurological:  Negative for dizziness, syncope and headaches.  Hematological:  Negative for adenopathy. Does not bruise/bleed easily.  Psychiatric/Behavioral:  Negative for decreased concentration and dysphoric mood. The patient is not nervous/anxious.        Objective:   Physical Exam Constitutional:      General: She is not in acute distress.    Appearance: Normal appearance. She is well-developed. She is not ill-appearing or diaphoretic.  HENT:     Head: Normocephalic and atraumatic.     Right Ear: Tympanic membrane, ear canal and external ear normal.     Left Ear: Tympanic membrane, ear canal and external ear normal.     Nose: Nose normal. No congestion.     Mouth/Throat:     Mouth: Mucous membranes are moist.     Pharynx: Oropharynx is clear. No posterior oropharyngeal erythema.  Eyes:     General: No scleral  icterus.    Extraocular Movements: Extraocular movements intact.     Conjunctiva/sclera: Conjunctivae normal.     Pupils: Pupils are equal, round, and reactive to light.  Neck:     Thyroid : No thyromegaly.     Vascular: No carotid bruit or JVD.  Cardiovascular:     Rate and Rhythm: Normal rate and regular rhythm.     Pulses: Normal pulses.     Heart sounds: Normal heart sounds.     No gallop.  Pulmonary:     Effort: Pulmonary effort is normal. No respiratory distress.     Breath sounds: Normal breath sounds. No wheezing.     Comments: Good air exch Chest:     Chest wall: No tenderness.  Abdominal:     General: Bowel sounds are normal. There is no distension or  abdominal bruit.     Palpations: Abdomen is soft. There is no mass.     Tenderness: There is no abdominal tenderness.     Hernia: No hernia is present.  Genitourinary:    Comments: Breast exam: No mass, nodules, thickening, tenderness, bulging, retraction, inflamation, nipple discharge or skin changes noted.  No axillary or clavicular LA.     Musculoskeletal:        General: No tenderness. Normal range of motion.     Cervical back: Normal range of motion and neck supple. No rigidity. No muscular tenderness.     Right lower leg: No edema.     Left lower leg: No edema.     Comments: No kyphosis   Lymphadenopathy:     Cervical: No cervical adenopathy.  Skin:    General: Skin is warm and dry.     Coloration: Skin is not pale.     Findings: No erythema or rash.     Comments: Solar lentigines diffusely Few scattered sks   Neurological:     Mental Status: She is alert. Mental status is at baseline.     Cranial Nerves: No cranial nerve deficit.     Motor: No abnormal muscle tone.     Coordination: Coordination normal.     Gait: Gait normal.     Deep Tendon Reflexes: Reflexes are normal and symmetric. Reflexes normal.  Psychiatric:        Mood and Affect: Mood normal.        Cognition and Memory: Cognition and memory normal.           Assessment & Plan:   Problem List Items Addressed This Visit       Digestive   Recurrent cold sores   Takes valcyclovir 2 g bid as needed         Other   Routine general medical examination at a health care facility - Primary   Reviewed health habits including diet and exercise and skin cancer prevention Reviewed appropriate screening tests for age  Also reviewed health mt list, fam hx and immunization status , as well as social and family history   See HPI Labs reviewed and ordered Health Maintenance  Topic Date Due   COVID-19 Vaccine (4 - 2025-26 season) 08/28/2025*   Zoster (Shingles) Vaccine (1 of 2) 11/10/2025*   Medicare Annual  Wellness Visit  06/15/2025   Breast Cancer Screening  02/05/2026   DTaP/Tdap/Td vaccine (4 - Td or Tdap) 05/14/2026   Colon Cancer Screening  07/29/2030   Pneumococcal Vaccine for age over 87  Completed   Flu Shot  Completed   Osteoporosis screening with Bone Density Scan  Completed   Hepatitis C Screening  Completed   Meningitis B Vaccine  Aged Out  *Topic was postponed. The date shown is not the original due date.    Flu shot given today  Discussed fall prevention, supplements and exercise for bone density  PHQ 0 Encouraged more strength building exercise   Follow up planned to discuss urinary symptoms, breathing and blood pressure         Prediabetes   Prediabetes  Lab Results  Component Value Date   HGBA1C 5.7 08/05/2024   HGBA1C 5.7 08/05/2023   HGBA1C 5.9 08/02/2022    disc imp of low glycemic diet and wt loss to prevent DM2        Mixed incontinence   Urinalysis today (recent uti)   Follow up to discuss further  May benefit from estrogen vaginal cream       Lump of scalp   Right scalp/posterior  Suspect cyst Pt will d/w dermatology at upcoming appointment       IRON DEFICIENCY ANEMIA, HX OF   No anemia today      Hyperlipidemia   Disc goals for lipids and reasons to control them Rev last labs with pt Rev low sat fat diet in detail Intol of statins  Stable LDL 121 Improved trig and HDL   Ascvd risk 14.3%      Elevated BP without diagnosis of hypertension   BP: 139/65  Will follow up to re check this   Encouraged low sodium (less processed food) diet and more exercise when able      Dysuria   Gets chilled feeling/discomfort with urination Recent treatment of uti  Urinalysis, ucx sent Urinalysis with sm leuk   Follow up planned       Relevant Orders   POCT Urinalysis Dipstick (Automated) (Completed)   Urine Culture   Breathing problem   Pt notes occational need/urge to take a very deep breath  No other symptoms ? If this could  be anx/stress related or other Will follow up for further eval       Other Visit Diagnoses       Need for influenza vaccination       Relevant Orders   Flu vaccine HIGH DOSE PF(Fluzone Trivalent) (Completed)     Abnormal urinalysis       Relevant Orders   Urine Culture         [1]  Social History Tobacco Use   Smoking status: Former    Current packs/day: 0.00    Average packs/day: 0.2 packs/day for 2.4 years (0.5 ttl pk-yrs)    Types: Cigarettes    Start date: 58    Quit date: 43    Years since quitting: 51.9   Smokeless tobacco: Never  Vaping Use   Vaping status: Never Used  Substance Use Topics   Alcohol use: Yes    Alcohol/week: 1.0 standard drink of alcohol    Types: 1 Glasses of wine per week    Comment: SOCIAL   Drug use: Never  [2]  Allergies Allergen Reactions   Penicillins Shortness Of Breath    REACTION: hives   Albuterol     Elevated heart rate   Amoxicillin     REACTION: hives   Atorvastatin     REACTION: joint pain   Bactrim  [Sulfamethoxazole -Trimethoprim ] Hives   Cephalexin     REACTION: hives   Doxycycline  Other (See Comments)    Caused bad Thursh in mouth   Gabapentin Other (See Comments)  Gave pt thrush    Mometasone  Furoate     REACTION: headache   Pseudoephedrine     Elevated heart rate   Rosuvastatin      REACTION: headache  [3]  Current Outpatient Medications on File Prior to Visit  Medication Sig Dispense Refill   acyclovir  (ZOVIRAX ) 400 MG tablet Take 1 tablet (400 mg total) by mouth 3 (three) times daily. For 5 days for cold sore 45 tablet 3   cholecalciferol (VITAMIN D3) 25 MCG (1000 UNIT) tablet Take 1,000 Units by mouth daily.     clindamycin (CLEOCIN T) 1 % lotion Apply topically every morning.     Multiple Vitamins-Minerals (MULTIVITAMIN ADULTS 50+ PO) Take 1 capsule by mouth daily.     Zinc 100 MG TABS Take 1 tablet by mouth daily.     No current facility-administered medications on file prior to visit.

## 2024-08-13 DIAGNOSIS — R220 Localized swelling, mass and lump, head: Secondary | ICD-10-CM | POA: Insufficient documentation

## 2024-08-13 DIAGNOSIS — R069 Unspecified abnormalities of breathing: Secondary | ICD-10-CM | POA: Insufficient documentation

## 2024-08-13 LAB — URINE CULTURE
MICRO NUMBER:: 17368015
Result:: NO GROWTH
SPECIMEN QUALITY:: ADEQUATE

## 2024-08-13 NOTE — Assessment & Plan Note (Signed)
 Reviewed health habits including diet and exercise and skin cancer prevention Reviewed appropriate screening tests for age  Also reviewed health mt list, fam hx and immunization status , as well as social and family history   See HPI Labs reviewed and ordered Health Maintenance  Topic Date Due   COVID-19 Vaccine (4 - 2025-26 season) 08/28/2025*   Zoster (Shingles) Vaccine (1 of 2) 11/10/2025*   Medicare Annual Wellness Visit  06/15/2025   Breast Cancer Screening  02/05/2026   DTaP/Tdap/Td vaccine (4 - Td or Tdap) 05/14/2026   Colon Cancer Screening  07/29/2030   Pneumococcal Vaccine for age over 11  Completed   Flu Shot  Completed   Osteoporosis screening with Bone Density Scan  Completed   Hepatitis C Screening  Completed   Meningitis B Vaccine  Aged Out  *Topic was postponed. The date shown is not the original due date.    Flu shot given today  Discussed fall prevention, supplements and exercise for bone density  PHQ 0 Encouraged more strength building exercise   Follow up planned to discuss urinary symptoms, breathing and blood pressure

## 2024-08-13 NOTE — Assessment & Plan Note (Signed)
Takes valcyclovir 2 g bid as needed

## 2024-08-13 NOTE — Assessment & Plan Note (Signed)
 Disc goals for lipids and reasons to control them Rev last labs with pt Rev low sat fat diet in detail Intol of statins  Stable LDL 121 Improved trig and HDL   Ascvd risk 14.3%

## 2024-08-13 NOTE — Assessment & Plan Note (Signed)
 BP: 139/65  Will follow up to re check this   Encouraged low sodium (less processed food) diet and more exercise when able

## 2024-08-13 NOTE — Assessment & Plan Note (Signed)
 Right scalp/posterior  Suspect cyst Pt will d/w dermatology at upcoming appointment

## 2024-08-13 NOTE — Assessment & Plan Note (Signed)
 Urinalysis today (recent uti)   Follow up to discuss further  May benefit from estrogen vaginal cream

## 2024-08-13 NOTE — Assessment & Plan Note (Signed)
No anemia today.

## 2024-08-13 NOTE — Assessment & Plan Note (Signed)
 Prediabetes  Lab Results  Component Value Date   HGBA1C 5.7 08/05/2024   HGBA1C 5.7 08/05/2023   HGBA1C 5.9 08/02/2022    disc imp of low glycemic diet and wt loss to prevent DM2

## 2024-08-13 NOTE — Assessment & Plan Note (Signed)
 Pt notes occational need/urge to take a very deep breath  No other symptoms ? If this could be anx/stress related or other Will follow up for further eval

## 2024-08-13 NOTE — Assessment & Plan Note (Addendum)
 Gets chilled feeling/discomfort with urination Recent treatment of uti  Urinalysis, ucx sent Urinalysis with sm leuk   Follow up planned
